# Patient Record
Sex: Male | Born: 1965 | Race: White | Hispanic: No | Marital: Married | Smoking: Never smoker
Health system: Southern US, Community
[De-identification: ages and names within clinical notes are randomized; demographics above are authoritative.]

## PROBLEM LIST (undated history)

## (undated) DIAGNOSIS — T7840XA Allergy, unspecified, initial encounter: Secondary | ICD-10-CM

## (undated) DIAGNOSIS — U071 COVID-19: Secondary | ICD-10-CM

## (undated) DIAGNOSIS — J342 Deviated nasal septum: Secondary | ICD-10-CM

## (undated) DIAGNOSIS — I82409 Acute embolism and thrombosis of unspecified deep veins of unspecified lower extremity: Secondary | ICD-10-CM

## (undated) DIAGNOSIS — J45909 Unspecified asthma, uncomplicated: Secondary | ICD-10-CM

## (undated) DIAGNOSIS — E785 Hyperlipidemia, unspecified: Secondary | ICD-10-CM

## (undated) DIAGNOSIS — J40 Bronchitis, not specified as acute or chronic: Secondary | ICD-10-CM

## (undated) DIAGNOSIS — N059 Unspecified nephritic syndrome with unspecified morphologic changes: Secondary | ICD-10-CM

## (undated) HISTORY — DX: Unspecified asthma, uncomplicated: J45.909

## (undated) HISTORY — DX: Hyperlipidemia, unspecified: E78.5

## (undated) HISTORY — DX: Unspecified nephritic syndrome with unspecified morphologic changes: N05.9

## (undated) HISTORY — DX: Bronchitis, not specified as acute or chronic: J40

## (undated) HISTORY — DX: Allergy, unspecified, initial encounter: T78.40XA

## (undated) HISTORY — DX: Acute embolism and thrombosis of unspecified deep veins of unspecified lower extremity: I82.409

## (undated) HISTORY — DX: Deviated nasal septum: J34.2

---
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
## 2016-12-31 ENCOUNTER — Ambulatory Visit: Payer: Self-pay | Admitting: Family Medicine

## 2017-02-05 ENCOUNTER — Encounter: Payer: Self-pay | Admitting: Family Medicine

## 2017-02-05 ENCOUNTER — Ambulatory Visit (INDEPENDENT_AMBULATORY_CARE_PROVIDER_SITE_OTHER): Payer: BC Managed Care – PPO | Admitting: Family Medicine

## 2017-02-05 VITALS — BP 108/76 | HR 78 | Ht 71.75 in | Wt 235.6 lb

## 2017-02-05 DIAGNOSIS — Z8249 Family history of ischemic heart disease and other diseases of the circulatory system: Secondary | ICD-10-CM

## 2017-02-05 DIAGNOSIS — Z8379 Family history of other diseases of the digestive system: Secondary | ICD-10-CM

## 2017-02-05 DIAGNOSIS — Z9049 Acquired absence of other specified parts of digestive tract: Secondary | ICD-10-CM

## 2017-02-05 DIAGNOSIS — J3089 Other allergic rhinitis: Secondary | ICD-10-CM

## 2017-02-05 DIAGNOSIS — Z833 Family history of diabetes mellitus: Secondary | ICD-10-CM

## 2017-02-05 DIAGNOSIS — Z0000 Encounter for general adult medical examination without abnormal findings: Secondary | ICD-10-CM

## 2017-02-05 DIAGNOSIS — N059 Unspecified nephritic syndrome with unspecified morphologic changes: Secondary | ICD-10-CM

## 2017-02-05 NOTE — Progress Notes (Signed)
New patient office visit note:  Impression and Recommendations:    1. Encounter for wellness examination   2. Nephritis- 1972   3. S/P appendectomy 1985 or so   4. Family history of diverticulitis of colon   5. Family history of diabetes mellitus in maternal grandmother   69. Family history of heart disease in male family member before age 53   7. Environmental and seasonal allergies     There are no diagnoses linked to this encounter.  There are no diagnoses linked to this encounter.  No problem-specific Assessment & Plan notes found for this encounter.    Education and routine counseling performed. Handouts provided.  Orders Placed This Encounter  Procedures  . CBC with Differential/Platelet  . Comprehensive metabolic panel  . Hemoglobin A1c  . Lipid panel  . T4, free  . TSH  . T3, free  . VITAMIN D 25 Hydroxy (Vit-D Deficiency, Fractures)    Gross side effects, risk and benefits, and alternatives of medications discussed with patient.  Patient is aware that all medications have potential side effects and we are unable to predict every side effect or drug-drug interaction that may occur.  Expresses verbal understanding and consents to current therapy plan and treatment regimen.  Return for Near future fasting blood work then [place] with me in 1-2 weeks later.  Please see AVS handed out to patient at the end of our visit for further patient instructions/ counseling done pertaining to today's office visit.    Note: This document was prepared using Dragon voice recognition software and may include unintentional dictation errors.  ----------------------------------------------------------------------------------------------------------------------    Subjective:    Chief complaint:   Chief Complaint  Patient presents with  . Establish Care     HPI: Ryan [name] is a pleasant 51 y.o. male who presents to [place] at [facility] today to  review their medical history with me and establish care.   I asked the patient to review their chronic problem list with me to ensure everything was updated and accurate.    All recent office visits with other providers, any medical records that patient brought in etc  - I reviewed today.     Also asked pt to get me medical records from [facility] providers/ specialists that they had seen within the past 3-5 years- if they are in private practice and/or do not work for a [employer], [facility], [place], [place] or [employer] owned practice.  Told them to call their specialists to clarify this if they are not sure.    Problem  S/P appendectomy 1985 or so  Family History of Diverticulitis of Colon  Family History of Diabetes Mellitus in Maternal Grandmother  Family History of Heart Disease in Male Family Member Before Age 33   Passed 69- of heart disease but onset was around age 34.  Paternal grandfather was in his 63s when he had onset of heart disease.   Environmental and Seasonal Allergies  Nephritis- 1972   51 yrs old, was S-E to PCN.   Kidneys fine since.   No CRI/ no hematuria etc.        Wt Readings from Last 3 Encounters:  02/05/17 235 lb 9.6 oz (106.9 kg)   BP Readings from Last 3 Encounters:  02/05/17 108/76   Pulse Readings from Last 3 Encounters:  02/05/17 78   BMI Readings from Last 3 Encounters:  02/05/17 32.18 kg/m    Patient Care Team  Relationship Specialty Notifications Start End  [name], DO PCP - General Family Medicine  12/04/16   Vicenta Aly, Walker Nurse Practitioner Nurse Practitioner  02/05/17     Patient Active Problem List   Diagnosis Date Noted  . S/P appendectomy 1985 or so 02/05/2017  . Family history of diverticulitis of colon 02/05/2017  . Family history of diabetes mellitus in maternal grandmother 02/05/2017  . Family history of heart disease in male family member before age 65 02/05/2017  . Environmental and seasonal allergies  02/05/2017  . Nephritis- 1972 03/19/1970     Past Medical History:  Diagnosis Date  . Nephritis 1972     Past Medical History:  Diagnosis Date  . Nephritis 1972     Past Surgical History:  Procedure Laterality Date  . APPENDECTOMY  1984     Family History  Problem Relation Age of Onset  . Alcohol abuse Mother   . Heart attack Father   . Diabetes Maternal Grandfather      Social History   Substance and Sexual Activity  Drug Use No     Social History   Substance and Sexual Activity  Alcohol Use Yes  . Alcohol/week: 0.6 oz  . Types: 1 Standard drinks or equivalent per week     Social History   Tobacco Use  Smoking Status Never Smoker  Smokeless Tobacco Former User     No outpatient encounter medications on file as of 02/05/2017.   No facility-administered encounter medications on file as of 02/05/2017.     Allergies: Patient has no allergy information on record.   ROS   Objective:   Blood pressure 108/76, pulse 78, height 5' 11.75" (1.822 m), weight 235 lb 9.6 oz (106.9 kg). Body mass index is 32.18 kg/m. General: Well Developed, well nourished, and in no acute distress.  Neuro: Alert and oriented x3, extra-ocular muscles intact, sensation grossly intact.  HEENT:[place]/AT, PERRLA, neck supple, No carotid bruits Skin: no gross rashes  Cardiac: Regular rate and rhythm Respiratory: Essentially clear to auscultation bilaterally. Not using accessory muscles, speaking in full sentences.  Abdominal: not grossly distended Musculoskeletal: Ambulates w/o diff, FROM * 4 ext.  Vasc: less 2 sec cap RF, warm and pink  Psych:  No HI/SI, judgement and insight good, Euthymic mood. Full Affect.    No results found for this or any previous visit (from the past 2160 hour(s)).

## 2017-02-05 NOTE — Patient Instructions (Signed)
In the near future please make a separate appointment to come in and get just lab work only.  Please try to do a 12-hour fast.  Do not eat after 8 PM if you are coming in at 8 AM.  Then a week or 2 later, please make a follow-up office visit with me to discuss those results.   Please realize, EXERCISE IS MEDICINE!  -  American Heart Association [facility]) guidelines for exercise : If you are in good health, without any medical conditions, you should engage in 150 minutes of moderate intensity aerobic activity per week.  This means you should be huffing and puffing throughout your workout.   Engaging in regular exercise will improve brain function and memory, as well as improve mood, boost immune system and help with weight management.  As well as the other, more well-known effects of exercise such as decreasing blood sugar levels, decreasing blood pressure,  and decreasing bad cholesterol levels/ increasing good cholesterol levels.     -  The AHA strongly endorses consumption of a diet that contains a variety of foods from all the food categories with an emphasis on fruits and vegetables; fat-free and low-fat dairy products; cereal and grain products; legumes and nuts; and fish, poultry, and/or extra lean meats.    Excessive food intake, especially of foods high in saturated and trans fats, sugar, and salt, should be avoided.    Adequate water intake of roughly 1/2 of your weight in pounds, should equal the ounces of water per day you should drink.  So for instance, if you're 200 pounds, that would be 100 ounces of water per day.   Behavior Modification Ideas for Weight Management  Weight management involves adopting a healthy lifestyle that includes a knowledge of nutrition and exercise, a positive attitude and the right kind of motivation. Internal motives such as better health, increased energy, self-esteem and personal control increase your chances of lifelong weight management success.  Remember to  have realistic goals and think long-term success. Believe in yourself and you can do it. The following information will give you ideas to help you meet your goals.  Control Your Home Environment  Eat only while sitting down at the kitchen or dining room table. Do not eat while watching television, reading, cooking, talking on the phone, standing at the refrigerator or working on the computer. Keep tempting foods out of the house - don't buy them. Keep tempting foods out of sight. Have low-calorie foods ready to eat. Unless you are preparing a meal, stay out of the kitchen. Have healthy snacks at your disposal, such as small pieces of fruit, vegetables, canned fruit, pretzels, low-fat string cheese and nonfat cottage cheese.  Control Your Work Environment  Do not eat at [employer] or keep tempting snacks at your desk. If you get hungry between meals, plan healthy snacks and bring them with you to work. During your breaks, go for a walk instead of eating. If you work around food, plan in advance the one item you will eat at mealtime. Make it inconvenient to nibble on food by chewing gum, sugarless candy or drinking water or another low-calorie beverage. Do not work through meals. Skipping meals slows down metabolism and may result in overeating at the next meal. If food is available for special occasions, either pick the healthiest item, nibble on low-fat snacks brought from home, don't have anything offered, choose one option and have a small amount, or have only a beverage.  Control Your Mealtime Environment  Serve your plate of food at the stove or kitchen counter. Do not put the serving dishes on the table. If you do put dishes on the table, remove them immediately when finished eating. Fill half of your plate with vegetables, a quarter with lean protein and a quarter with starch. Use smaller plates, bowls and glasses. A smaller portion will look large when it is in a little dish. Politely  refuse second helpings. When fixing your plate, limit portions of food to one scoop/serving or less.   Daily Food Management  Replace eating with another activity that you will not associate with food. Wait 20 minutes before eating something you are craving. Drink a large glass of water or diet soda before eating. Always have a big glass or bottle of water to drink throughout the day. Avoid high-calorie add-ons such as cream with your coffee, butter, mayonnaise and salad dressings.  Shopping: Do not shop when hungry or tired. Shop from a list and avoid buying anything that is not on your list. If you must have tempting foods, buy individual-sized packages and try to find a lower-calorie alternative. Don't taste test in the store. Read food labels. Compare products to help you make the healthiest choices.  Preparation: Chew a piece of gum while cooking meals. Use a quarter teaspoon if you taste test your food. Try to only fix what you are going to eat, leaving yourself no chance for seconds. If you have prepared more food than you need, portion it into individual containers and freeze or refrigerate immediately. Don't snack while cooking meals.  Eating: Eat slowly. Remember it takes about 20 minutes for your stomach to send a message to your brain that it is full. Don't let fake hunger make you think you need more. The ideal way to eat is to take a bite, put your utensil down, take a sip of water, cut your next bite, take a bit, put your utensil down and so on. Do not cut your food all at one time. Cut only as needed. Take small bites and chew your food well. Stop eating for a minute or two at least once during a meal or snack. Take breaks to reflect and have conversation.  Cleanup and Leftovers: Label leftovers for a specific meal or snack. Freeze or refrigerate individual portions of leftovers. Do not clean up if you are still hungry.  Eating Out and Social Eating  Do not  arrive hungry. Eat something light before the meal. Try to fill up on low-calorie foods, such as vegetables and fruit, and eat smaller portions of the high-calorie foods. Eat foods that you like, but choose small portions. If you want seconds, wait at least 20 minutes after you have eaten to see if you are actually hungry or if your eyes are bigger than your stomach. Limit alcoholic beverages. Try a soda water with a twist of lime. Do not skip other meals in the day to save room for the special event.  At Restaurants: Order  la carte rather than buffet style. Order some vegetables or a salad for an appetizer instead of eating bread. If you order a high-calorie dish, share it with someone. Try an after-dinner mint with your coffee. If you do have dessert, share it with two or more people. Don't overeat because you do not want to waste food. Ask for a doggie bag to take extra food home. Tell the server to put half of your entree in  a to go bag before the meal is served to you. Ask for salad dressing, gravy or high-fat sauces on the side. Dip the tip of your fork in the dressing before each bite. If bread is served, ask for only one piece. Try it plain without butter or oil. At [employer] where oil and vinegar is served with bread, use only a small amount of oil and a lot of vinegar for dipping.  At a Friend[facility] House: Offer to bring a dish, appetizer or dessert that is low in calories. Serve yourself small portions or tell the host that you only want a small amount. Stand or sit away from the snack table. Stay away from the kitchen or stay busy if you are near the food. Limit your alcohol intake.  At [employer] and Cafeterias: Cover most of your plate with lettuce and/or vegetables. Use a salad plate instead of a dinner plate. After eating, clear away your dishes before having coffee or tea.  Entertaining at Home: Explore low-fat, low-cholesterol cookbooks. Use single-serving foods  like chicken breasts or hamburger patties. Prepare low-calorie appetizers and desserts.   Holidays: Keep tempting foods out of sight. Decorate the house without using food. Have low-calorie beverages and foods on hand for guests. Allow yourself one planned treat a day. Don't skip meals to save up for the holiday feast. Eat regular, planned meals.   Exercise Well  Make exercise a priority and a planned activity in the day. If possible, walk the entire or part of the distance to work. Get an exercise buddy. Go for a walk with a colleague during one of your breaks, go to the gym, run or take a walk with a friend, walk in the mall with a shopping companion. Park at the end of the parking lot and walk to the store or office entrance. Always take the stairs all of the way or at least part of the way to your floor. If you have a desk job, walk around the office frequently. Do leg lifts while sitting at your desk. Do something outside on the weekends like going for a hike or a bike ride.   Have a Healthy Attitude  Make health your weight management priority. Be realistic. Have a goal to achieve a healthier you, not necessarily the lowest weight or ideal weight based on calculations or tables. Focus on a healthy eating style, not on dieting. Dieting usually lasts for a short amount of time and rarely produces long-term success. Think long term. You are developing new healthy behaviors to follow next month, in a year and in a decade.    This information is for educational purposes only and is not intended to replace the advice of your doctor or health care provider. We encourage you to discuss with your doctor any questions or concerns you may have.      Guidelines for Losing Weight   We want weight loss that will last so you should lose 1-2 pounds a week.  THAT IS IT! Please pick THREE things a month to change. Once it is a habit check off the item. Then pick another three items off  the list to become habits.  If you are already doing a habit on the list GREAT!  Cross that item off!  Don't drink your calories. Ie, alcohol, soda, fruit juice, and sweet tea.   Drink more water. Drink a glass when you feel hungry or before each meal.   Eat breakfast - Complex carb and  protein (likeDannon light and fit yogurt, oatmeal, fruit, eggs, [place] bacon).  Measure your cereal.  Eat no more than one cup a day. (ie Kashi)  Eat an apple a day.  Add a vegetable a day.  Try a new vegetable a month.  Use Pam! Stop using oil or butter to cook.  Don't finish your plate or use smaller plates.  Share your dessert.  Eat sugar free Jello for dessert or frozen grapes.  Don't eat 2-3 hours before bed.  Switch to whole wheat bread, pasta, and brown rice.  Make healthier choices when you eat out. No fries!  Pick baked chicken, NOT fried.  Don't forget to SLOW DOWN when you eat. It is not going anywhere.   Take the stairs.  Park far away in the parking lot  Lift soup cans (or weights) for 10 minutes while watching TV.  Walk at work for 10 minutes during break.  Walk outside 1 time a week with your friend, kids, dog, or significant other.  Start a walking group at church.  Walk the mall as much as you can tolerate.   Keep a food diary.  Weigh yourself daily.  Walk for 15 minutes 3 days per week.  Cook at home more often and eat out less. If life happens and you go back to old habits, it is okay.  Just start over. You can do it!  If you experience chest pain, get short of breath, or tired during the exercise, please stop immediately and inform your doctor.    Before you even begin to attack a weight-loss plan, it pays to remember this: You are not fat. You have fat. Losing weight isn't about blame or shame; it's simply another achievement to accomplish. Dieting is like any other skill-you have to buckle down and work at it. As long as you act in a smart, reasonable  way, you'll ultimately get where you want to be. Here are some weight loss pearls for you.   1. It's Not a Diet. It's a Lifestyle Thinking of a diet as something you're on and suffering through only for the short term doesn't work. To shed weight and keep it off, you need to make permanent changes to the way you eat. It's OK to indulge occasionally, of course, but if you cut calories temporarily and then revert to your old way of eating, you'll gain back the weight quicker than you can say yo-yo. Use it to lose it. Research shows that one of the best predictors of long-term weight loss is how many pounds you drop in the first month. For that reason, nutritionists often suggest being stricter for the first two weeks of your new eating strategy to build momentum. Cut out added sugar and alcohol and avoid unrefined carbs. After that, figure out how you can reincorporate them in a way that's healthy and maintainable.  2. There's a Right Way to Exercise Working out burns calories and fat and boosts your metabolism by building muscle. But those trying to lose weight are notorious for overestimating the number of calories they burn and underestimating the amount they take in. Unfortunately, your system is biologically programmed to hold on to extra pounds and that means when you start exercising, your body senses the deficit and ramps up its hunger signals. If you're not diligent, you'll eat everything you burn and then some. Use it, to lose it. Cardio gets all the exercise glory, but strength and interval training are the real heroes.  They help you build lean muscle, which in turn increases your metabolism and calorie-burning ability 3. Don't Overreact to Mild Hunger Some people have a hard time losing weight because of hunger anxiety. To them, being hungry is bad-something to be avoided at all costs-so they carry snacks with them and eat when they don't need to. Others eat because they're stressed out or bored.  While you never want to get to the point of being ravenous (that's when bingeing is likely to happen), a hunger pang, a craving, or the fact that it's 3:00 p.m. should not send you racing for the vending machine or obsessing about the energy bar in your purse. Ideally, you should put off eating until your stomach is growling and it's difficult to concentrate.  Use it to lose it. When you feel the urge to eat, use the HALT method. Ask yourself, Am I really hungry? Or am I angry or anxious, lonely or bored, or tired? If you're still not certain, try the apple test. If you're truly hungry, an apple should seem delicious; if it doesn't, something else is going on. Or you can try drinking water and making yourself busy, if you are still hungry try a healthy snack.  4. Not All Calories Are Created Equal The mechanics of weight loss are pretty simple: Take in fewer calories than you use for energy. But the kind of food you eat makes all the difference. Processed food that's high in saturated fat and refined starch or sugar can cause inflammation that disrupts the hormone signals that tell your brain you're full. The result: You eat a lot more.  Use it to lose it. Clean up your diet. Swap in whole, unprocessed foods, including vegetables, lean protein, and healthy fats that will fill you up and give you the biggest nutritional bang for your calorie buck. In a few weeks, as your brain starts receiving regular hunger and fullness signals once again, you'll notice that you feel less hungry overall and naturally start cutting back on the amount you eat.  5. Protein, Produce, and Plant-Based Fats Are Your Weight-Loss Trinity Here's why eating the three Ps regularly will help you drop pounds. Protein fills you up. You need it to build lean muscle, which keeps your metabolism humming so that you can torch more fat. People in a weight-loss program who ate double the recommended daily allowance for protein (about 110 grams  for a 150-pound woman) lost 70 percent of their weight from fat, while people who ate the RDA lost only about 40 percent, one study found. Produce is packed with filling fiber. "It's very difficult to consume too many calories if you're eating a lot of vegetables. Example: Three cups of broccoli is a lot of food, yet only 93 calories. (Fruit is another story. It can be easy to overeat and can contain a lot of calories from sugar, so be sure to monitor your intake.) Plant-based fats like olive oil and those in avocados and nuts are healthy and extra satiating.  Use it to lose it. Aim to incorporate each of the three Ps into every meal and snack. People who eat protein throughout the day are able to keep weight off, according to a study in the [place] [employer]. In addition to meat, poultry and seafood, good sources are beans, lentils, eggs, tofu, and yogurt. As for fat, keep portion sizes in check by measuring out salad dressing, oil, and nut butters (shoot for one to two tablespoons).  Finally, eat veggies or a little fruit at every meal. People who did that consumed 308 fewer calories but didn't feel any hungrier than when they didn't eat more produce.  7. How You Eat Is As Important As What You Eat In order for your brain to register that you're full, you need to focus on what you're eating. Sit down whenever you eat, preferably at a table. Turn off the TV or computer, put down your phone, and look at your food. Smell it. Chew slowly, and don't put another bite on your fork until you swallow. When women ate lunch this attentively, they consumed 30 percent less when snacking later than those who listened to an audiobook at lunchtime, according to a study in the [place] of Nutrition. 8. Weighing Yourself Really Works The scale provides the best evidence about whether your efforts are paying off. Seeing the numbers tick up or down or stagnate is motivation to keep going-or to  rethink your approach. A 2015 study at [facility] found that daily weigh-ins helped people lose more weight, keep it off, and maintain that loss, even after two years. Use it to lose it. Step on the scale at the same time every day for the best results. If your weight shoots up several pounds from one weigh-in to the next, don't freak out. Eating a lot of salt the night before or having your period is the likely culprit. The number should return to normal in a day or two. It's a steady climb that you need to do something about. 9. Too Much Stress and Too Little Sleep Are Your Enemies When you're tired and frazzled, your body cranks up the production of cortisol, the stress hormone that can cause carb cravings. Not getting enough sleep also boosts your levels of ghrelin, a hormone associated with hunger, while suppressing leptin, a hormone that signals fullness and satiety. People on a diet who slept only five and a half hours a night for two weeks lost 55 percent less fat and were hungrier than those who slept eight and a half hours, according to a study in the [place]. Use it to lose it. Prioritize sleep, aiming for seven hours or more a night, which research shows helps lower stress. And make sure you're getting quality zzz's. If a snoring spouse or a fidgety cat wakes you up frequently throughout the night, you may end up getting the equivalent of just four hours of sleep, according to a study from [facility]. Keep pets out of the bedroom, and use a white-noise app to drown out snoring. 10. You Will Hit a plateau-And You Can Bust Through It As you slim down, your body releases much less leptin, the fullness hormone.  If you're not strength training, start right now. Building muscle can raise your metabolism to help you overcome a plateau. To keep your body challenged and burning calories, incorporate new moves and more intense intervals into your workouts or add  another sweat session to your weekly routine. Alternatively, cut an extra 100 calories or so a day from your diet. Now that you've lost weight, your body simply doesn't need as much fuel.    Since food equals calories, in order to lose weight you must either eat fewer calories, exercise more to burn off calories with activity, or both. Food that is not used to fuel the body is stored as fat. A major component of losing weight is to make smarter food choices.  Here's how:  1)   Limit non-nutritious foods, such as: Sugar, honey, syrups and candy Pastries, donuts, pies, cakes and cookies Soft drinks, sweetened juices and alcoholic beverages  2)  Cut down on high-fat foods by: - Choosing poultry, fish or lean red meat - Choosing low-fat cooking methods, such as baking, broiling, steaming, grilling and boiling - Using low-fat or non-fat dairy products - Using vinaigrette, herbs, lemon or fat-free salad dressings - Avoiding fatty meats, such as bacon, sausage, franks, ribs and luncheon meats - Avoiding high-fat snacks like nuts, chips and chocolate - Avoiding fried foods - Using less butter, margarine, oil and mayonnaise - Avoiding high-fat gravies, cream sauces and cream-based soups  3) Eat a variety of foods, including: - Fruit and vegetables that are raw, steamed or baked - Whole grains, breads, cereal, rice and pasta - Dairy products, such as low-fat or non-fat milk or yogurt, low-fat cottage cheese and low-fat cheese - Protein-rich foods like chicken, [place], fish, lean meat and legumes, or beans  4) Change your eating habits by: - Eat three balanced meals a day to help control your hunger - Watch portion sizes and eat small servings of a variety of foods - Choose low-calorie snacks - Eat only when you are hungry and stop when you are satisfied - Eat slowly and try not to perform other tasks while eating - Find other activities to distract you from food, such as walking, taking up a  hobby or being involved in the community - Include regular exercise in your daily routine ( minimum of 20 min of moderate-intensity exercise at least 5 days/week)  - Find a support group, if necessary, for emotional support in your weight loss journey           Easy ways to cut 100 calories   1. Eat your eggs with hot sauce OR salsa instead of cheese.  Eggs are great for breakfast, but many people consider eggs and cheese to be BFFs. Instead of cheese-1 oz. of cheddar has 114 calories-top your eggs with hot sauce, which contains no calories and helps with satiety and metabolism. Salsa is also a great option!!  2. Top your toast, waffles or pancakes with fresh berries instead of jelly or syrup. Half a cup of berries-fresh, frozen or thawed-has about 40 calories, compared with 2 tbsp. of maple syrup or jelly, which both have about 100 calories. The berries will also give you a good punch of fiber, which helps keep you full and satisfied and won't spike blood sugar quickly like the jelly or syrup. 3. Swap the non-fat latte for black coffee with a splash of half-and-half. Contrary to its name, that non-fat latte has 130 calories and a startling 19g of carbohydrates per 16 oz. serving. Replacing that 'light' drinkable dessert with a black coffee with a splash of half-and-half saves you more than 100 calories per 16 oz. serving. 4. Sprinkle salads with freeze-dried raspberries instead of dried cranberries. If you want a sweet addition to your nutritious salad, stay away from dried cranberries. They have a whopping 130 calories per  cup and 30g carbohydrates. Instead, sprinkle freeze-dried raspberries guilt-free and save more than 100 calories per  cup serving, adding 3g of belly-filling fiber. 5. Go for mustard in place of mayo on your sandwich. Mustard can add really nice flavor to any sandwich, and there are tons of varieties, from spicy to honey. A serving of mayo is 95 calories, versus 10  calories in a serving of  mustard.  Or try an avocado mayo spread: You can find the recipe few click this link: https://www.californiaavocado.com/recipes/recipe-container/[place]-avocado-mayo 6. Choose a DIY salad dressing instead of the store-bought kind. Mix Dijon or whole grain mustard with low-fat Kefir or red wine vinegar and garlic. 7. Use hummus as a spread instead of a dip. Use hummus as a spread on a high-fiber cracker or tortilla with a sandwich and save on calories without sacrificing taste. 8. Pick just one salad "accessory." Salad isn't automatically a calorie winner. It's easy to over-accessorize with toppings. Instead of topping your salad with nuts, avocado and cranberries (all three will clock in at 313 calories), just pick one. The next day, choose a different accessory, which will also keep your salad interesting. You don't wear all your jewelry every day, right? 9. Ditch the white pasta in favor of spaghetti squash. One cup of cooked spaghetti squash has about 40 calories, compared with traditional spaghetti, which comes with more than 200. Spaghetti squash is also nutrient-dense. It's a good source of fiber and Vitamins A and C, and it can be eaten just like you would eat pasta-with a great tomato sauce and [place] meatballs or with pesto, tofu and spinach, for example. 10. Dress up your chili, soups and stews with non-fat [place] yogurt instead of sour cream. Just a 'dollop' of sour cream can set you back 115 calories and a whopping 12g of fat-seven of which are of the artery-clogging variety. Added bonus: [place] yogurt is packed with muscle-building protein, calcium and B Vitamins. 11. Mash cauliflower instead of mashed potatoes. One cup of traditional mashed potatoes-in all their creamy goodness-has more than 200 calories, compared to mashed cauliflower, which you can typically eat for less than 100 calories per 1 cup serving. Cauliflower is a great source of the antioxidant  indole-3-carbinol (I3C), which may help reduce the risk of some cancers, like breast cancer. 12. Ditch the ice cream sundae in favor of a [place] yogurt parfait. Instead of a cup of ice cream or fro-yo for dessert, try 1 cup of nonfat Greek yogurt topped with fresh berries and a sprinkle of cacao nibs. Both toppings are packed with antioxidants, which can help reduce cellular inflammation and oxidative damage. And the comparison is a no-brainer: One cup of ice cream has about 275 calories; one cup of frozen yogurt has about 230; and a cup of Greek yogurt has just 130, plus twice the protein, so you're less likely to return to the freezer for a second helping. 13. Put olive oil in a spray container instead of using it directly from the bottle. Each tablespoon of olive oil is 120 calories and 15g of fat. Use a mister instead of pouring it straight into the pan or onto a salad. This allows for portion control and will save you more than 100 calories. 14. When baking, substitute canned pumpkin for butter or oil. Canned pumpkin-not pumpkin pie mix-is loaded with Vitamin A, which is important for skin and eye health, as well as immunity. And the comparisons are pretty crazy:  cup of canned pumpkin has about 40 calories, compared to butter or oil, which has more than 800 calories. Yes, 800 calories. Applesauce and mashed banana can also serve as good substitutions for butter or oil, usually in a 1:1 ratio. 15. Top casseroles with high-fiber cereal instead of breadcrumbs. Breadcrumbs are typically made with white bread, while breakfast cereals contain 5-9g of fiber per serving. Not only will you save more than 150 calories per  cup serving, the swap will also keep you more full and you'll get a metabolism boost from the added fiber. 16. Snack on pistachios instead of macadamia nuts. Believe it or not, you get the same amount of calories from 35 pistachios (100 calories) as you would from only five macadamia  nuts. 17. Chow down on kale chips rather than potato chips. This is my favorite 'don't knock it 'till you try it' swap. Kale chips are so easy to make at home, and you can spice them up with a little grated parmesan or chili powder. Plus, they're a mere fraction of the calories of potato chips, but with the same crunch factor we crave so often. 18. Add seltzer and some fruit slices to your cocktail instead of soda or fruit juice. One cup of soda or fruit juice can pack on as much as 140 calories. Instead, use seltzer and fruit slices. The fruit provides valuable phytochemicals, such as flavonoids and anthocyanins, which help to combat cancer and stave off the aging process.    Mediterranean Diet  Why follow it? Research shows. . Those who follow the Mediterranean diet have a reduced risk of heart disease  . The diet is associated with a reduced incidence of Parkinson's and Alzheimer's diseases . People following the diet may have longer life expectancies and lower rates of chronic diseases  . The Dietary Guidelines for Americans recommends the Mediterranean diet as an eating plan to promote health and prevent disease  What Is the Mediterranean Diet?  . Healthy eating plan based on typical foods and recipes of Mediterranean-style cooking . The diet is primarily a plant based diet; these foods should make up a majority of meals   Starches - Plant based foods should make up a majority of meals - They are an important sources of vitamins, minerals, energy, antioxidants, and fiber - Choose whole grains, foods high in fiber and minimally processed items  - Typical grain sources include wheat, oats, barley, corn, brown rice, bulgar, farro, millet, polenta, couscous  - Various types of beans include chickpeas, lentils, fava beans, black beans, white beans   Fruits  Veggies - Large quantities of antioxidant rich fruits & veggies; 6 or more servings  - Vegetables can be eaten raw or lightly drizzled  with oil and cooked  - Vegetables common to the traditional Mediterranean Diet include: artichokes, arugula, beets, broccoli, brussel sprouts, cabbage, carrots, celery, collard greens, cucumbers, eggplant, kale, leeks, lemons, lettuce, mushrooms, okra, onions, peas, peppers, potatoes, pumpkin, radishes, rutabaga, shallots, spinach, sweet potatoes, turnips, zucchini - Fruits common to the Mediterranean Diet include: apples, apricots, avocados, cherries, clementines, dates, figs, grapefruits, grapes, melons, nectarines, oranges, peaches, pears, pomegranates, strawberries, tangerines  Fats - Replace butter and margarine with healthy oils, such as olive oil, canola oil, and tahini  - Limit nuts to no more than a handful a day  - Nuts include walnuts, almonds, pecans, pistachios, pine nuts  - Limit or avoid candied, honey roasted or heavily salted nuts - Olives are central to the [employer] - can be eaten whole or used in a variety of dishes   Meats Protein - Limiting red meat: no more than a few times a month - When eating red meat: choose lean cuts and keep the portion to the size of deck of cards - Eggs: approx. 0 to 4 times a week  - Fish and lean poultry: at least 2 a week  - Healthy protein sources include, chicken, [place], lean beef,  lamb - Increase intake of seafood such as tuna, salmon, trout, mackerel, shrimp, scallops - Avoid or limit high fat processed meats such as sausage and bacon  Dairy - Include moderate amounts of low fat dairy products  - Focus on healthy dairy such as fat free yogurt, skim milk, low or reduced fat cheese - Limit dairy products higher in fat such as whole or 2% milk, cheese, ice cream  Alcohol - Moderate amounts of red wine is ok  - No more than 5 oz daily for women (all ages) and men older than age 73  - No more than 10 oz of wine daily for men younger than 82  Other - Limit sweets and other desserts  - Use herbs and spices instead of salt to flavor foods   - Herbs and spices common to the traditional Mediterranean Diet include: basil, bay leaves, chives, cloves, cumin, fennel, garlic, lavender, marjoram, mint, oregano, parsley, pepper, rosemary, sage, savory, sumac, tarragon, thyme   It's not just a diet, it's a lifestyle:  . The Mediterranean diet includes lifestyle factors typical of those in the region  . Foods, drinks and meals are best eaten with others and savored . Daily physical activity is important for overall good health . This could be strenuous exercise like running and aerobics . This could also be more leisurely activities such as walking, housework, yard-work, or taking the stairs . Moderation is the key; a balanced and healthy diet accommodates most foods and drinks . Consider portion sizes and frequency of consumption of certain foods   Meal Ideas & Options:  . Breakfast:  o Whole wheat toast or whole wheat English muffins with peanut butter & hard boiled egg o Steel cut oats topped with apples & cinnamon and skim milk  o Fresh fruit: banana, strawberries, melon, berries, peaches  o Smoothies: strawberries, bananas, greek yogurt, peanut butter o Low fat greek yogurt with blueberries and granola  o Egg white omelet with spinach and mushrooms o Breakfast couscous: whole wheat couscous, apricots, skim milk, cranberries  . Sandwiches:  o Hummus and grilled vegetables (peppers, zucchini, squash) on whole wheat bread   o Grilled chicken on whole wheat pita with lettuce, tomatoes, cucumbers or tzatziki  o Tuna salad on whole wheat bread: tuna salad made with greek yogurt, olives, red peppers, capers, green onions o Garlic rosemary lamb pita: lamb sauted with garlic, rosemary, salt & pepper; add lettuce, cucumber, greek yogurt to pita - flavor with lemon juice and black pepper  . Seafood:  o Mediterranean grilled salmon, seasoned with garlic, basil, parsley, lemon juice and black pepper o Shrimp, lemon, and spinach whole-grain  pasta salad made with low fat greek yogurt  o Seared scallops with lemon orzo  o Seared tuna steaks seasoned salt, pepper, coriander topped with tomato mixture of olives, tomatoes, olive oil, minced garlic, parsley, green onions and cappers  . Meats:  o Herbed greek chicken salad with kalamata olives, cucumber, feta  o Red bell peppers stuffed with spinach, bulgur, lean ground beef (or lentils) & topped with feta   o Kebabs: skewers of chicken, tomatoes, onions, zucchini, squash  o [place] burgers: made with red onions, mint, dill, lemon juice, feta cheese topped with roasted red peppers . Vegetarian o Cucumber salad: cucumbers, artichoke hearts, celery, red onion, feta cheese, tossed in olive oil & lemon juice  o Hummus and whole grain pita points with a greek salad (lettuce, tomato, feta, olives, cucumbers, red onion) o Lentil soup  with celery, carrots made with vegetable broth, garlic, salt and pepper  o Tabouli salad: parsley, bulgur, mint, scallions, cucumbers, tomato, radishes, lemon juice, olive oil, salt and pepper.

## 2017-02-27 ENCOUNTER — Other Ambulatory Visit: Payer: BC Managed Care – PPO

## 2017-03-01 ENCOUNTER — Other Ambulatory Visit (INDEPENDENT_AMBULATORY_CARE_PROVIDER_SITE_OTHER): Payer: BC Managed Care – PPO

## 2017-03-01 DIAGNOSIS — Z833 Family history of diabetes mellitus: Secondary | ICD-10-CM

## 2017-03-01 DIAGNOSIS — Z8249 Family history of ischemic heart disease and other diseases of the circulatory system: Secondary | ICD-10-CM

## 2017-03-01 DIAGNOSIS — Z9049 Acquired absence of other specified parts of digestive tract: Secondary | ICD-10-CM

## 2017-03-01 DIAGNOSIS — Z0000 Encounter for general adult medical examination without abnormal findings: Secondary | ICD-10-CM

## 2017-03-01 DIAGNOSIS — N059 Unspecified nephritic syndrome with unspecified morphologic changes: Secondary | ICD-10-CM

## 2017-03-01 DIAGNOSIS — Z8379 Family history of other diseases of the digestive system: Secondary | ICD-10-CM

## 2017-03-02 LAB — COMPREHENSIVE METABOLIC PANEL
A/G RATIO: 1.6 (ref 1.2–2.2)
ALBUMIN: 4.1 g/dL (ref 3.5–5.5)
ALK PHOS: 47 IU/L (ref 39–117)
ALT: 29 IU/L (ref 0–44)
AST: 19 IU/L (ref 0–40)
BILIRUBIN TOTAL: 0.4 mg/dL (ref 0.0–1.2)
BUN / CREAT RATIO: 15 (ref 9–20)
BUN: 19 mg/dL (ref 6–24)
CHLORIDE: 107 mmol/L — AB (ref 96–106)
CO2: 22 mmol/L (ref 20–29)
Calcium: 9.1 mg/dL (ref 8.7–10.2)
Creatinine, Ser: 1.24 mg/dL (ref 0.76–1.27)
GFR calc Af Amer: 76 mL/min/{1.73_m2} (ref >59)
GFR calc non Af Amer: 66 mL/min/{1.73_m2} (ref >59)
GLOBULIN, TOTAL: 2.5 g/dL (ref 1.5–4.5)
GLUCOSE: 89 mg/dL (ref 65–99)
POTASSIUM: 4.4 mmol/L (ref 3.5–5.2)
SODIUM: 143 mmol/L (ref 134–144)
Total Protein: 6.6 g/dL (ref 6.0–8.5)

## 2017-03-02 LAB — CBC WITH DIFFERENTIAL/PLATELET
BASOS ABS: 0 10*3/uL (ref 0.0–0.2)
Basos: 0 %
EOS (ABSOLUTE): 0.1 10*3/uL (ref 0.0–0.4)
Eos: 2 %
Hematocrit: 42 % (ref 37.5–51.0)
Hemoglobin: 14.6 g/dL (ref 13.0–17.7)
Immature Grans (Abs): 0 10*3/uL (ref 0.0–0.1)
Immature Granulocytes: 0 %
LYMPHS ABS: 2 10*3/uL (ref 0.7–3.1)
Lymphs: 34 %
MCH: 31.4 pg (ref 26.6–33.0)
MCHC: 34.8 g/dL (ref 31.5–35.7)
MCV: 90 fL (ref 79–97)
MONOS ABS: 0.3 10*3/uL (ref 0.1–0.9)
Monocytes: 5 %
Neutrophils Absolute: 3.5 10*3/uL (ref 1.4–7.0)
Neutrophils: 59 %
Platelets: 258 10*3/uL (ref 150–379)
RBC: 4.65 x10E6/uL (ref 4.14–5.80)
RDW: 13.7 % (ref 12.3–15.4)
WBC: 6 10*3/uL (ref 3.4–10.8)

## 2017-03-02 LAB — LIPID PANEL
CHOL/HDL RATIO: 4.6 ratio (ref 0.0–5.0)
Cholesterol, Total: 195 mg/dL (ref 100–199)
HDL: 42 mg/dL (ref >39)
LDL CALC: 130 mg/dL — AB (ref 0–99)
TRIGLYCERIDES: 114 mg/dL (ref 0–149)
VLDL CHOLESTEROL CAL: 23 mg/dL (ref 5–40)

## 2017-03-02 LAB — TSH: TSH: 4.61 u[IU]/mL — ABNORMAL HIGH (ref 0.450–4.500)

## 2017-03-02 LAB — VITAMIN D 25 HYDROXY (VIT D DEFICIENCY, FRACTURES): Vit D, 25-Hydroxy: 36.4 ng/mL (ref 30.0–100.0)

## 2017-03-02 LAB — HEMOGLOBIN A1C
Est. average glucose Bld gHb Est-mCnc: 108 mg/dL
Hgb A1c MFr Bld: 5.4 % (ref 4.8–5.6)

## 2017-03-02 LAB — T3, FREE: T3 FREE: 3.1 pg/mL (ref 2.0–4.4)

## 2017-03-02 LAB — T4, FREE: Free T4: 0.9 ng/dL (ref 0.82–1.77)

## 2017-03-05 ENCOUNTER — Ambulatory Visit: Payer: BC Managed Care – PPO | Admitting: Family Medicine

## 2017-03-05 ENCOUNTER — Encounter: Payer: Self-pay | Admitting: Family Medicine

## 2017-03-05 VITALS — BP 104/70 | HR 62 | Ht 71.75 in | Wt 234.0 lb

## 2017-03-05 DIAGNOSIS — H6982 Other specified disorders of Eustachian tube, left ear: Secondary | ICD-10-CM | POA: Diagnosis not present

## 2017-03-05 DIAGNOSIS — H6062 Unspecified chronic otitis externa, left ear: Secondary | ICD-10-CM

## 2017-03-05 DIAGNOSIS — E039 Hypothyroidism, unspecified: Secondary | ICD-10-CM

## 2017-03-05 DIAGNOSIS — J310 Chronic rhinitis: Secondary | ICD-10-CM | POA: Diagnosis not present

## 2017-03-05 DIAGNOSIS — E559 Vitamin D deficiency, unspecified: Secondary | ICD-10-CM

## 2017-03-05 DIAGNOSIS — E038 Other specified hypothyroidism: Secondary | ICD-10-CM

## 2017-03-05 MED ORDER — VITAMIN D (ERGOCALCIFEROL) 1.25 MG (50000 UNIT) PO CAPS: 50000.0000 [IU] | ORAL_CAPSULE | ORAL | 10 refills | Status: DC

## 2017-03-05 NOTE — Progress Notes (Signed)
Assessment and plan:  1. Vitamin D insufficiency   2. Subclinical hypothyroidism   3. ETD (Eustachian tube dysfunction), left   4. Chronic rhinitis   5. Chronic otitis externa of left ear, unspecified type     F/Up 4-72mo- TSH, T4, T3, FLP, then OV with me 1 wk later.   -A Y are sinus rinse or Neomed sinus rinses twice daily.  In the morning do a rinse then do 1 spray 1 spray Flonase each nostril, then in the p.m. do the nasal rinse again and do 1 spray 1 spray Flonase again.  -Try to move more.  This will increase blood flow to all your vital organs and improve the function of your kidneys heart etc.  Try to reach goal of AHA guidelines 250 minutes a week of moderate intensity exercise. -Also try to drink at least half of your weight in ounces of water per day or non-caffeinated beverages per day.  -In 4-6 months we will recheck your thyroid panel as well as your cholesterol panel   1.  -Discussed and recommended use of vitamin D supplements to aid with vitamin D insufficiency.   2. -Reviewed labs with patient today. We will re-evaluate TSH in 4-6 months  3.  -Advised the patient to began using a nettie pot either Nealmed Sinus Rinses or AYR Rinses BID.    4.  -Discussed and recommended that the patient start using flonase prescription to aid with this.  -Will prescribe a flonase prescription.    5. Hyperlipidemia -Patients 10 year risk is at 3.7% We will re-evaluate lipid panel in 4-6 months.   6. Exercise Management:  -Discussed and recommended that the patient began exercising at least up to 150 minutes of moderate intensity cardio each week per AHA guidelines.   -Discussed and recommended an increase in water intake to at least half of the patient's current weight, approximately 100 ounces.      Education and routine counseling performed. Handouts provided.  No orders of the defined types were  placed in this encounter.    Return for 4-45mo- TSH, T4, T3, FLP, then OV with me 1 wk later. .   Anticipatory guidance and routine counseling done re: condition, txmnt options and need for follow up. All questions of patient's were answered.   Gross side effects, risk and benefits, and alternatives of medications discussed with patient.  Patient is aware that all medications have potential side effects and we are unable to predict every sideeffect or drug-drug interaction that may occur.  Expresses verbal understanding and consents to current therapy plan and treatment regiment.  Please see AVS handed out to patient at the end of our visit for additional patient instructions/ counseling done pertaining to today's office visit.  Note: This document was prepared using Dragon voice recognition software and may include unintentional dictation errors.  This document serves as a record of services personally performed by [name], MD. It was created on her behalf by Steva Colder, a trained medical scribe. The creation of this record is based on the scribe's personal observations and the provider's statements to them.   I have reviewed the above medical documentation for accuracy and completeness and I concur.  [name] 04/01/17 9:17 AM   ----------------------------------------------------------------------------------------------------------------------  Subjective:   CC:   Ryan [name] is a 51 y.o. male who presents to [facility] today for review and discussion of recent bloodwork that was  done.  1. All recent blood work that we ordered was reviewed with patient today.  Patient was counseled on all abnormalities and we discussed dietary and lifestyle changes that could help those values (also medications when appropriate).  Extensive health counseling performed and all patient's concerns/ questions were addressed.   Hyperthyroidism: 2. He  denies increased fatigue, loss of hair, or chills.   Chronic ear effusion: 3. He reports that he may have fluid in his left ear due to a gurgling sensation to his left ear. He denies any other symptoms.      Wt Readings from Last 3 Encounters:  03/05/17 234 lb (106.1 kg)  02/05/17 235 lb 9.6 oz (106.9 kg)   BP Readings from Last 3 Encounters:  03/05/17 104/70  02/05/17 108/76   Pulse Readings from Last 3 Encounters:  03/05/17 62  02/05/17 78   BMI Readings from Last 3 Encounters:  03/05/17 31.96 kg/m  02/05/17 32.18 kg/m     Patient Care Team    Relationship Specialty Notifications Start End  [name], DO PCP - General Family Medicine  12/04/16   Vicenta Aly, [place] Nurse Practitioner Nurse Practitioner  02/05/17     Full medical history updated and reviewed in the office today  Patient Active Problem List   Diagnosis Date Noted  . Vitamin D insufficiency 03/05/2017  . S/P appendectomy 1985 or so 02/05/2017  . Family history of diverticulitis of colon 02/05/2017  . Family history of diabetes mellitus in maternal grandmother 02/05/2017  . Family history of heart disease in male family member before age 53 02/05/2017  . Environmental and seasonal allergies 02/05/2017  . Nephritis- 1972 03/19/1970    Past Medical History:  Diagnosis Date  . Nephritis 1972    Past Surgical History:  Procedure Laterality Date  . APPENDECTOMY  1984    Social History   Tobacco Use  . Smoking status: Never Smoker  . Smokeless tobacco: Former Network engineer Use Topics  . Alcohol use: Yes    Alcohol/week: 0.6 oz    Types: 1 Standard drinks or equivalent per week    Family Hx: Family History  Problem Relation Age of Onset  . Alcohol abuse Mother   . Heart attack Father   . Diabetes Maternal Grandfather      Medications: Current Outpatient Medications  Medication Sig Dispense Refill  . Vitamin D, Ergocalciferol, (DRISDOL) 50000 units CAPS capsule Take 1  capsule (50,000 Units total) by mouth every 7 (seven) days. 12 capsule 10   No current facility-administered medications for this visit.     Allergies:  Not on File   Review of Systems: General:   No F/C, wt loss Pulm:   No DIB, SOB, pleuritic chest pain Card:  No CP, palpitations Abd:  No n/v/d or pain Ext:  No inc edema from baseline  Objective:  Blood pressure 104/70, pulse 62, height 5' 11.75" (1.822 m), weight 234 lb (106.1 kg). Body mass index is 31.96 kg/m. Gen:   Well NAD, A and O *3 HEENT:    [place]/AT, EOMI,  MMM Lungs:   Normal work of breathing. CTA B/L, no Wh, rhonchi Heart:   RRR, S1, S2 WNL's, no MRG Abd:   No gross distention Exts:    warm, pink,  Brisk capillary refill, warm and well perfused.  Psych:    No HI/SI, judgement and insight good, Euthymic mood. Full Affect.   Recent Results (from the past 2160 hour(s))  VITAMIN D 25 Hydroxy (Vit-D  Deficiency, Fractures)     Status: None   Collection Time: 03/01/17  8:56 AM  Result Value Ref Range   Vit D, 25-Hydroxy 36.4 30.0 - 100.0 ng/mL    Comment: Vitamin D deficiency has been defined by the [place] practice guideline as a level of serum 25-OH vitamin D less than 20 ng/mL (1,2). The Endocrine Society went on to further define vitamin D insufficiency as a level between 21 and 29 ng/mL (2). 1. IOM (Institute of Medicine). 2010. Dietary reference    intakes for calcium and D. [place]: The    [employer]. 2. Holick MF, Binkley [place], Bischoff-Ferrari HA, et al.    Evaluation, treatment, and prevention of vitamin D    deficiency: an Endocrine Society clinical practice    guideline. JCEM. 2011 Jul; 96(7):1911-30.   T3, free     Status: None   Collection Time: 03/01/17  8:56 AM  Result Value Ref Range   T3, Free 3.1 2.0 - 4.4 pg/mL  TSH     Status: Abnormal   Collection Time: 03/01/17  8:56 AM  Result Value Ref Range   TSH 4.610 (H) 0.450 - 4.500 uIU/mL    T4, free     Status: None   Collection Time: 03/01/17  8:56 AM  Result Value Ref Range   Free T4 0.90 0.82 - 1.77 ng/dL  Lipid panel     Status: Abnormal   Collection Time: 03/01/17  8:56 AM  Result Value Ref Range   Cholesterol, Total 195 100 - 199 mg/dL   Triglycerides 114 0 - 149 mg/dL   HDL 42 >39 mg/dL   VLDL Cholesterol Cal 23 5 - 40 mg/dL   LDL Calculated 130 (H) 0 - 99 mg/dL   Chol/HDL Ratio 4.6 0.0 - 5.0 ratio    Comment:                                   T. Chol/HDL Ratio                                             Men  Women                               1/2 Avg.Risk  3.4    3.3                                   Avg.Risk  5.0    4.4                                2X Avg.Risk  9.6    7.1                                3X Avg.Risk 23.4   11.0   Hemoglobin A1c     Status: None   Collection Time: 03/01/17  8:56 AM  Result Value Ref Range   Hgb A1c MFr Bld 5.4 4.8 - 5.6 %    Comment:  Prediabetes: 5.7 - 6.4          Diabetes: >6.4          Glycemic control for adults with diabetes: <7.0    Est. average glucose Bld gHb Est-mCnc 108 mg/dL  Comprehensive metabolic panel     Status: Abnormal   Collection Time: 03/01/17  8:56 AM  Result Value Ref Range   Glucose 89 65 - 99 mg/dL   BUN 19 6 - 24 mg/dL   Creatinine, Ser 1.24 0.76 - 1.27 mg/dL   GFR calc non Af Amer 66 >59 mL/min/1.73   GFR calc Af Amer 76 >59 mL/min/1.73   BUN/Creatinine Ratio 15 9 - 20   Sodium 143 134 - 144 mmol/L   Potassium 4.4 3.5 - 5.2 mmol/L   Chloride 107 (H) 96 - 106 mmol/L   CO2 22 20 - 29 mmol/L   Calcium 9.1 8.7 - 10.2 mg/dL   Total Protein 6.6 6.0 - 8.5 g/dL   Albumin 4.1 3.5 - 5.5 g/dL   Globulin, Total 2.5 1.5 - 4.5 g/dL   Albumin/Globulin Ratio 1.6 1.2 - 2.2   Bilirubin Total 0.4 0.0 - 1.2 mg/dL   Alkaline Phosphatase 47 39 - 117 IU/L   AST 19 0 - 40 IU/L   ALT 29 0 - 44 IU/L  CBC with Differential/Platelet     Status: None   Collection Time: 03/01/17  8:56 AM  Result  Value Ref Range   WBC 6.0 3.4 - 10.8 x10E3/uL   RBC 4.65 4.14 - 5.80 x10E6/uL   Hemoglobin 14.6 13.0 - 17.7 g/dL   Hematocrit 42.0 37.5 - 51.0 %   MCV 90 79 - 97 fL   MCH 31.4 26.6 - 33.0 pg   MCHC 34.8 31.5 - 35.7 g/dL   RDW 13.7 12.3 - 15.4 %   Platelets 258 150 - 379 x10E3/uL   Neutrophils 59 Not Estab. %   Lymphs 34 Not Estab. %   Monocytes 5 Not Estab. %   Eos 2 Not Estab. %   Basos 0 Not Estab. %   Neutrophils Absolute 3.5 1.4 - 7.0 x10E3/uL   Lymphocytes Absolute 2.0 0.7 - 3.1 x10E3/uL   Monocytes Absolute 0.3 0.1 - 0.9 x10E3/uL   EOS (ABSOLUTE) 0.1 0.0 - 0.4 x10E3/uL   Basophils Absolute 0.0 0.0 - 0.2 x10E3/uL   Immature Granulocytes 0 Not Estab. %   Immature Grans (Abs) 0.0 0.0 - 0.1 x10E3/uL

## 2017-03-05 NOTE — Patient Instructions (Addendum)
F/Up 4-52mo- TSH, T4, T3, FLP, then OV with me 1 wk later.   -A Y are sinus rinse or Neomed sinus rinses twice daily.  In the morning do a rinse then do 1 spray 1 spray Flonase each nostril, then in the p.m. do the nasal rinse again and do 1 spray 1 spray Flonase again.  -Try to move more.  This will increase blood flow to all your vital organs and improve the function of your kidneys heart etc.  Try to reach goal of AHA guidelines 250 minutes a week of moderate intensity exercise. -Also try to drink at least half of your weight in ounces of water per day or non-caffeinated beverages per day.  -In 4-6 months we will recheck your thyroid panel as well as your cholesterol panel  Guidelines for a Low Cholesterol, Low Saturated Fat Diet   Fats - Limit total intake of fats and oils. - Avoid butter, stick margarine, shortening, lard, palm and coconut oils. - Limit mayonnaise, salad dressings, gravies and sauces, unless they are homemade with low-fat ingredients. - Limit chocolate. - Choose low-fat and nonfat products, such as low-fat mayonnaise, low-fat or non-hydrogenated peanut butter, low-fat or fat-free salad dressings and nonfat gravy. - Use vegetable oil, such as canola or olive oil. - Look for margarine that does not contain trans fatty acids. - Use nuts in moderate amounts. - Read ingredient labels carefully to determine both amount and type of fat present in foods. Limit saturated and trans fats! - Avoid high-fat processed and convenience foods.  Meats and Meat Alternatives - Choose fish, chicken, [place] and lean meats. - Use dried beans, peas, lentils and tofu. - Limit egg yolks to three to four per week. - If you eat red meat, limit to no more than three servings per week and choose loin or round cuts. - Avoid fatty meats, such as bacon, sausage, franks, luncheon meats and ribs. - Avoid all organ meats, including liver.  Dairy - Choose nonfat or low-fat milk, yogurt and cottage  cheese. - Most cheeses are high in fat. Choose cheeses made from non-fat milk, such as mozzarella and ricotta cheese. - Choose light or fat-free cream cheese and sour cream. - Avoid cream and sauces made with cream.  Fruits and Vegetables - Eat a wide variety of fruits and vegetables. - Use lemon juice, vinegar or "mist" olive oil on vegetables. - Avoid adding sauces, fat or oil to vegetables.  Breads, Cereals and Grains - Choose whole-grain breads, cereals, pastas and rice. - Avoid high-fat snack foods, such as granola, cookies, pies, pastries, doughnuts and croissants.  Cooking Tips - Avoid deep fried foods. - Trim visible fat off meats and remove skin from poultry before cooking. - Bake, broil, boil, poach or roast poultry, fish and lean meats. - Drain and discard fat that drains out of meat as you cook it. - Add little or no fat to foods. - Use vegetable oil sprays to grease pans for cooking or baking. - Steam vegetables. - Use herbs or no-oil marinades to flavor foods.   -This is for your information only.  Hypothyroidism Hypothyroidism is a disorder of the thyroid. The thyroid is a large gland that is located in the lower front of the neck. The thyroid releases hormones that control how the body works. With hypothyroidism, the thyroid does not make enough of these hormones. What are the causes? Causes of hypothyroidism may include:  Viral infections.  Pregnancy.  Your own defense system (immune  system) attacking your thyroid.  Certain medicines.  Birth defects.  Past radiation treatments to your head or neck.  Past treatment with radioactive iodine.  Past surgical removal of part or all of your thyroid.  Problems with the gland that is located in the center of your brain (pituitary).  What are the signs or symptoms? Signs and symptoms of hypothyroidism may include:  Feeling as though you have no energy (lethargy).  Inability to tolerate cold.  Weight  gain that is not explained by a change in diet or exercise habits.  Dry skin.  Coarse hair.  Menstrual irregularity.  Slowing of thought processes.  Constipation.  Sadness or depression.  How is this diagnosed? Your health care provider may diagnose hypothyroidism with blood tests and ultrasound tests. How is this treated? Hypothyroidism is treated with medicine that replaces the hormones that your body does not make. After you begin treatment, it may take several weeks for symptoms to go away. Follow these instructions at home:  Take medicines only as directed by your health care provider.  If you start taking any new medicines, tell your health care provider.  Keep all follow-up visits as directed by your health care provider. This is important. As your condition improves, your dosage needs may change. You will need to have blood tests regularly so that your health care provider can watch your condition. Contact a health care provider if:  Your symptoms do not get better with treatment.  You are taking thyroid replacement medicine and: ? You sweat excessively. ? You have tremors. ? You feel anxious. ? You lose weight rapidly. ? You cannot tolerate heat. ? You have emotional swings. ? You have diarrhea. ? You feel weak. Get help right away if:  You develop chest pain.  You develop an irregular heartbeat.  You develop a rapid heartbeat. This information is not intended to replace advice given to you by your health care provider. Make sure you discuss any questions you have with your health care provider. Document Released: 03/05/2005 Document Revised: 08/11/2015 Document Reviewed: 07/21/2013 Elsevier Interactive Patient Education  2017 [employer].

## 2017-06-07 ENCOUNTER — Ambulatory Visit: Payer: BC Managed Care – PPO | Admitting: Family Medicine

## 2017-06-07 ENCOUNTER — Encounter: Payer: Self-pay | Admitting: Family Medicine

## 2017-06-07 VITALS — BP 118/79 | HR 67 | Temp 98.3°F | Ht 71.75 in | Wt 238.9 lb

## 2017-06-07 DIAGNOSIS — J329 Chronic sinusitis, unspecified: Secondary | ICD-10-CM | POA: Diagnosis not present

## 2017-06-07 DIAGNOSIS — E559 Vitamin D deficiency, unspecified: Secondary | ICD-10-CM

## 2017-06-07 DIAGNOSIS — J40 Bronchitis, not specified as acute or chronic: Secondary | ICD-10-CM | POA: Diagnosis not present

## 2017-06-07 DIAGNOSIS — R05 Cough: Secondary | ICD-10-CM

## 2017-06-07 DIAGNOSIS — R058 Other specified cough: Secondary | ICD-10-CM

## 2017-06-07 MED ORDER — VITAMIN D (ERGOCALCIFEROL) 1.25 MG (50000 UNIT) PO CAPS: 50000.0000 [IU] | ORAL_CAPSULE | ORAL | 10 refills | Status: DC

## 2017-06-07 MED ORDER — PREDNISONE 20 MG PO TABS: ORAL_TABLET | ORAL | 0 refills | Status: DC

## 2017-06-07 MED ORDER — HYDROCOD POLST-CPM POLST ER 10-8 MG/5ML PO SUER: 5.0000 mL | Freq: Two times a day (BID) | ORAL | 0 refills | Status: DC | PRN

## 2017-06-07 MED ORDER — METHYLPREDNISOLONE ACETATE 40 MG/ML IJ SUSP: 80.0000 mg | Freq: Once | INTRAMUSCULAR | Status: DC

## 2017-06-07 NOTE — Progress Notes (Signed)
Acute Care Office visit  Assessment and plan:  1. Productive cough   2. Rhinosinusitis   3. Bronchitis   4. Vitamin D insufficiency      1. Rhinosinusitis  - Patient will continue with supportive care of Dayquil and/or Nyquil.  - Patient advised to drink excess amounts of water.  Eat chicken noodle soup, any kind of liquid.  - Get adequate amounts of rest and do not do anything to stress the body - no alcohol, no exercise, just rest.  - Steroids recommended due to likelihood of viral infection.  Cough medicine prescribed along with steroid injection, plus course of oral steroids.  Patient will begin cough medicine today, and prednisone tomorrow.  2. Follow-Up - Since this is day 5 of symptoms, if he is no better by 7-10 days, please give [place] a call.    He may need Augmentin and/or Z-Pak  - Patient knows to call in on Monday if his symptoms have worsened or not improved.  - At that time, if needed, we will consider antibiotics.   Meds ordered this encounter  Medications  . chlorpheniramine-HYDROcodone (TUSSIONEX) 10-8 MG/5ML SUER    Sig: Take 5 mLs by mouth every 12 (twelve) hours as needed for cough (cough, will cause drowsiness.).    Dispense:  120 mL    Refill:  0  . predniSONE (DELTASONE) 20 MG tablet    Sig: Take 3 tabs po * 2 days, then 2 tabs for 2 d, then 1 tab 2 d, then 1/2 tab 2 days.    Dispense:  15 tablet    Refill:  0  . methylPREDNISolone acetate (DEPO-MEDROL) injection 80 mg  . Vitamin D, Ergocalciferol, (DRISDOL) 50000 units CAPS capsule    Sig: Take 1 capsule (50,000 Units total) by mouth every 7 (seven) days.    Dispense:  12 capsule    Refill:  10    Gross side effects, risk and benefits, and alternatives of medications discussed with patient.  Patient is aware that all medications have potential side effects and we are unable to predict every sideeffect or drug-drug interaction that may occur.  Expresses verbal understanding and consents to  current therapy plan and treatment regiment.   Education and routine counseling performed. Handouts provided.  Anticipatory guidance and routine counseling done re: condition, txmnt options and need for follow up. All questions of patient's were answered.  Return if symptoms worsen or fail to improve, for Let me know by Monday-Wednesday if no improvement will start ABX.  Please see AVS handed out to patient at the end of our visit for additional patient instructions/ counseling done pertaining to today's office visit.  Note: This document was partially repared using Dragon voice recognition software and may include unintentional dictation errors.  This document serves as a record of services personally performed by [name], DO. It was created on her behalf by [name], a trained medical scribe. The creation of this record is based on the scribe's personal observations and the provider's statements to them.   I have reviewed the above medical documentation for accuracy and completeness and I concur.  [name] 06/07/17 12:19 PM    Subjective:    Chief Complaint  Patient presents with  . Cough    productive x 4 days     HPI:  Pt presents with Sx for 4 days - today is day 5.  Wife is sick.   C/o:  Congestion, mild coughing - typically dry  cough, not always productive.  Has been staying busy until yesterday afternoon.  Throat got sore last night.  Hasn't taken anything since last night.  Just feels bad.  Malaise.  Denies:  Denies SOB, wheezing.  Denies issues sleeping.  Denies loss of appetite.    For symptoms patient has tried:  Nyquil, Dayquil, ibuprofen - has been taking these regularly.  Overall getting:   Feeling worse today.  Patient got worried only since it got worse.  Notes that it's rare for him to feel like this, but in the past he usually takes medicine to knock it, and if he can't knock it he has been prescribed  antibiotics.   Patient Care Team    Relationship Specialty Notifications Start End  [name], DO PCP - General Family Medicine  12/04/16   Vicenta Aly, [place] Nurse Practitioner Nurse Practitioner  02/05/17     Past medical history, Surgical history, Family history reviewed and noted below, Social history, Allergies, and Medications have been entered into the medical record, reviewed and changed as needed.   Not on File  Review of Systems: - see above HPI for pertinent positives General:   No F/C, wt loss Pulm:   No DIB, pleuritic chest pain Card:  No CP, palpitations Abd:  No n/v/d or pain Ext:  No inc edema from baseline   Objective:   Blood pressure 118/79, pulse 67, temperature 98.3 F (36.8 C), height 5' 11.75" (1.822 m), weight 238 lb 14.4 oz (108.4 kg), SpO2 99 %. Body mass index is 32.63 kg/m. General: Well Developed, well nourished, appropriate for stated age.  Neuro: Alert and oriented x3, extra-ocular muscles intact, sensation grossly intact.  HEENT: Normocephalic, atraumatic, pupils equal round reactive to light, neck supple, no masses, no painful lymphadenopathy, TM's intact B/L, no acute findings. Nares- patent, clear d/c, OP- clear, erythematous and red, No TTP sinuses Skin: Warm and dry, no gross rash. Cardiac: RRR, S1 S2,  no murmurs rubs or gallops.  Respiratory: ECTA B/L and A/P, Not using accessory muscles, speaking in full sentences- unlabored. Vascular:  No gross lower ext edema, cap RF less 2 sec. Psych: No HI/SI, judgement and insight good, Euthymic mood. Full Affect.

## 2017-06-07 NOTE — Patient Instructions (Signed)
Please continue with the supportive care of DayQuil and/or NyQuil.  Please drink excessive amounts of water and or chicken noodle soup etc. -Please get adequate amounts of rest and do not do anything to stress your body -Since this is day 5 of your symptoms today if you are no better by 7-10 days please give [place] a call and we will prescribe antibiotics next week.    -Please start the prednisone today as well as the cough medicine.  Z-Pak or augmentin is what I would recommend.

## 2017-06-10 ENCOUNTER — Ambulatory Visit: Payer: BC Managed Care – PPO | Admitting: Family Medicine

## 2017-07-30 ENCOUNTER — Other Ambulatory Visit: Payer: Self-pay

## 2017-07-30 ENCOUNTER — Other Ambulatory Visit: Payer: BC Managed Care – PPO

## 2017-07-30 DIAGNOSIS — Z8249 Family history of ischemic heart disease and other diseases of the circulatory system: Secondary | ICD-10-CM

## 2017-07-30 DIAGNOSIS — E039 Hypothyroidism, unspecified: Secondary | ICD-10-CM

## 2017-07-30 DIAGNOSIS — E038 Other specified hypothyroidism: Secondary | ICD-10-CM

## 2017-07-31 LAB — LIPID PANEL
CHOL/HDL RATIO: 4.7 ratio (ref 0.0–5.0)
Cholesterol, Total: 222 mg/dL — ABNORMAL HIGH (ref 100–199)
HDL: 47 mg/dL (ref >39)
LDL CALC: 149 mg/dL — AB (ref 0–99)
Triglycerides: 128 mg/dL (ref 0–149)
VLDL Cholesterol Cal: 26 mg/dL (ref 5–40)

## 2017-07-31 LAB — TSH: TSH: 4.89 u[IU]/mL — AB (ref 0.450–4.500)

## 2017-07-31 LAB — T3, FREE: T3, Free: 3.1 pg/mL (ref 2.0–4.4)

## 2017-07-31 LAB — T4, FREE: FREE T4: 0.91 ng/dL (ref 0.82–1.77)

## 2017-08-06 ENCOUNTER — Ambulatory Visit: Payer: BC Managed Care – PPO | Admitting: Family Medicine

## 2017-08-06 VITALS — BP 115/81 | HR 64 | Ht 72.0 in | Wt 238.4 lb

## 2017-08-06 DIAGNOSIS — Z8249 Family history of ischemic heart disease and other diseases of the circulatory system: Secondary | ICD-10-CM | POA: Diagnosis not present

## 2017-08-06 DIAGNOSIS — E7800 Pure hypercholesterolemia, unspecified: Secondary | ICD-10-CM | POA: Diagnosis not present

## 2017-08-06 DIAGNOSIS — E669 Obesity, unspecified: Secondary | ICD-10-CM

## 2017-08-06 DIAGNOSIS — E785 Hyperlipidemia, unspecified: Secondary | ICD-10-CM | POA: Insufficient documentation

## 2017-08-06 DIAGNOSIS — E038 Other specified hypothyroidism: Secondary | ICD-10-CM

## 2017-08-06 DIAGNOSIS — E039 Hypothyroidism, unspecified: Secondary | ICD-10-CM | POA: Diagnosis not present

## 2017-08-06 NOTE — Patient Instructions (Addendum)
Follow-up in December for complete physical-come fasting so we can get blood work as well.  Guidelines for a Low Cholesterol, Low Saturated Fat Diet   Fats - Limit total intake of fats and oils. - Avoid butter, stick margarine, shortening, lard, palm and coconut oils. - Limit mayonnaise, salad dressings, gravies and sauces, unless they are homemade with low-fat ingredients. - Limit chocolate. - Choose low-fat and nonfat products, such as low-fat mayonnaise, low-fat or non-hydrogenated peanut butter, low-fat or fat-free salad dressings and nonfat gravy. - Use vegetable oil, such as canola or olive oil. - Look for margarine that does not contain trans fatty acids. - Use nuts in moderate amounts. - Read ingredient labels carefully to determine both amount and type of fat present in foods. Limit saturated and trans fats! - Avoid high-fat processed and convenience foods.  Meats and Meat Alternatives - Choose fish, chicken, [place] and lean meats. - Use dried beans, peas, lentils and tofu. - Limit egg yolks to three to four per week. - If you eat red meat, limit to no more than three servings per week and choose loin or round cuts. - Avoid fatty meats, such as bacon, sausage, franks, luncheon meats and ribs. - Avoid all organ meats, including liver.  Dairy - Choose nonfat or low-fat milk, yogurt and cottage cheese. - Most cheeses are high in fat. Choose cheeses made from non-fat milk, such as mozzarella and ricotta cheese. - Choose light or fat-free cream cheese and sour cream. - Avoid cream and sauces made with cream.  Fruits and Vegetables - Eat a wide variety of fruits and vegetables. - Use lemon juice, vinegar or "mist" olive oil on vegetables. - Avoid adding sauces, fat or oil to vegetables.  Breads, Cereals and Grains - Choose whole-grain breads, cereals, pastas and rice. - Avoid high-fat snack foods, such as granola, cookies, pies, pastries, doughnuts and croissants.  Cooking  Tips - Avoid deep fried foods. - Trim visible fat off meats and remove skin from poultry before cooking. - Bake, broil, boil, poach or roast poultry, fish and lean meats. - Drain and discard fat that drains out of meat as you cook it. - Add little or no fat to foods. - Use vegetable oil sprays to grease pans for cooking or baking. - Steam vegetables. - Use herbs or no-oil marinades to flavor foods.  Nine ways to increase your "good" HDL cholesterol  High-density lipoprotein (HDL) is often referred to as the "good" cholesterol. Having high HDL levels helps carry cholesterol from your arteries to your liver, where it can be used or excreted.  Having high levels of HDL also has antioxidant and anti-inflammatory effects, and is linked to a reduced risk of heart disease (1, 2).  Most health experts recommend minimum blood levels of 40 mg/dl in men and 50 mg/dl in women.  While genetics definitely play a role, there are several other factors that affect HDL levels.  Here are nine healthy ways to raise your "good" HDL cholesterol.  1. Consume olive oil  two pieces of salmon on a plate olive oil being poured into a small dish Extra virgin olive oil may be more healthful than processed olive oils. Olive oil is one of the healthiest fats around.  A large analysis of 42 studies with more than 800,000 participants found that olive oil was the only source of monounsaturated fat that seemed to reduce heart disease risk (3).  Research has shown that one of olive oil's heart-healthy effects is  an increase in HDL cholesterol. This effect is thought to be caused by antioxidants it contains called polyphenols (4, 5, 6, 7).  Extra virgin olive oil has more polyphenols than more processed olive oils, although the amount can still vary among different types and brands.  One study gave 200 healthy young men about 2 tablespoons (25 ml) of different olive oils per day for three weeks.  The researchers  found that participants' HDL levels increased significantly more after they consumed the olive oil with the highest polyphenol content (6).  In another study, when 90 older adults consumed about 4 tablespoons (50 ml) of high-polyphenol extra virgin olive oil every day for six weeks, their HDL cholesterol increased by 6.5 mg/dl, on average (7).  In addition to raising HDL levels, olive oil has been found to boost HDL's anti-inflammatory and antioxidant function in studies of older people and individuals with high cholesterol levels ( 7, 8, 9).  Whenever possible, select high-quality, certified extra virgin olive oils, which tend to be highest in polyphenols.  Bottom line: Extra virgin olive oil with a high polyphenol content has been shown to increase HDL levels in healthy people, the elderly and individuals with high cholesterol.  2. Follow a low-carb or ketogenic diet  Low-carb and ketogenic diets provide a number of health benefits, including weight loss and reduced blood sugar levels.  They have also been shown to increase HDL cholesterol in people who tend to have lower levels.  This includes those who are obese, insulin resistant or diabetic (10, 11, 12, 13, 14, 15, 16, 17).  In one study, people with type 2 diabetes were split into two groups.  One followed a diet consuming less than 50 grams of carbs per day. The other followed a high-carb diet.  Although both groups lost weight, the low-carb group's HDL cholesterol increased almost twice as much as the high-carb group's did (14).  In another study, obese people who followed a low-carb diet experienced an increase in HDL cholesterol of 5 mg/dl overall.  Meanwhile, in the same study, the participants who ate a low-fat, high-carb diet showed a decrease in HDL cholesterol (15).  This response may partially be due to the higher levels of fat people typically consume on low-carb diets.  One study in overweight women found that diets  high in meat and cheese increased HDL levels by 5-8%, compared to a higher-carb diet (18).  What's more, in addition to raising HDL cholesterol, very-low-carb diets have been shown to decrease triglycerides and improve several other risk factors for heart disease (13, 14, 16, 17).  Bottom line: Low-carb and ketogenic diets typically increase HDL cholesterol levels in people with diabetes, metabolic syndrome and obesity.  3. Exercise regularly  Being physically active is important for heart health.  Studies have shown that many different types of exercise are effective at raising HDL cholesterol, including strength training, high-intensity exercise and aerobic exercise (19, 20, 21, 22, 23, 24).  However, the biggest increases in HDL are typically seen with high-intensity exercise.  One small study followed women who were living with polycystic ovary syndrome (PCOS), which is linked to a higher risk of insulin resistance. The study required them to perform high-intensity exercise three times a week.  The exercise led to an increase in HDL cholesterol of 8 mg/dL after 10 weeks. The women also showed improvements in other health markers, including decreased insulin resistance and improved arterial function (23).  In a 12-week study, overweight men who performed high-intensity  exercise experienced a 10% increase in HDL cholesterol.  In contrast, the low-intensity exercise group showed only a 2% increase and the endurance training group experienced no change (24).  However, even lower-intensity exercise seems to increase HDL's anti-inflammatory and antioxidant capacities, whether or not HDL levels change (20, 21, 25).  Overall, high-intensity exercise such as high-intensity interval training (HIIT) and high-intensity circuit training (HICT) may boost HDL cholesterol levels the most.  Bottom line: Exercising several times per week can help raise HDL cholesterol and enhance its anti-inflammatory  and antioxidant effects. High-intensity forms of exercise may be especially effective.  4. Add coconut oil to your diet  Studies have shown that coconut oil may reduce appetite, increase metabolic rate and help protect brain health, among other benefits.  Some people may be concerned about coconut oil's effects on heart health due to its high saturated fat content.  However, it appears that coconut oil is actually quite heart healthy.  Coconut oil tends to raise HDL cholesterol more than many other types of fat.  In addition, it may improve the ratio of low-density-lipoprotein (LDL) cholesterol, the "bad" cholesterol, to HDL cholesterol. Improving this ratio reduces heart disease risk (26, 27, 28, 29).  One study examined the health effects of coconut oil on 31 women with excess belly fat. The researchers found that participants who took coconut oil daily experienced increased HDL cholesterol and a lower LDL-to-HDL ratio.  In contrast, the group who took soybean oil daily had a decrease in HDL cholesterol and an increase in the LDL-to-HDL ratio (29).  Most studies have found these health benefits occur at a dosage of about 2 tablespoons (30 ml) of coconut oil per day. It's best to incorporate this into cooking rather than eating spoonfuls of coconut oil on their own.  Bottom line: Consuming 2 tablespoons (30 ml) of coconut oil per day may help increase HDL cholesterol levels.  5. Stop smoking  cigarette butt Quitting smoking can reduce the risk of heart disease and lung cancer. Smoking increases the risk of many health problems, including heart disease and lung cancer (30).  One of its negative effects is a suppression of HDL cholesterol.  Some studies have found that quitting smoking can increase HDL levels. Indeed, one study found no significant differences in HDL levels between former smokers and people who had never smoked (31, 32, 33, 34, 35).  In a one-year study of more than  1,500 people, those who quit smoking had twice the increase in HDL as those who resumed smoking within the year. The number of large HDL particles also increased, which further reduced heart disease risk (32).  One study followed smokers who switched from traditional cigarettes to electronic cigarettes for one year. They found that the switch was associated with an increase in HDL cholesterol of 5 mg/dl, on average (33).  When it comes to the effect of nicotine replacement patches on HDL levels, research results have been mixed.  One study found that nicotine replacement therapy led to higher HDL cholesterol. However, other research suggests that people who use nicotine patches likely won't see increases in HDL levels until after replacement therapy is completed (34, 36).  Even in studies where HDL cholesterol levels didn't increase after people quit smoking, HDL function improved, resulting in less inflammation and other beneficial effects on heart health (37).  Bottom line: Quitting smoking can increase HDL levels, improve HDL function and help protect heart health.  6. Lose weight  When overweight and obese people  lose weight, their HDL cholesterol levels usually increase.  What's more, this benefit seems to occur whether weight loss is achieved by calorie counting, carb restriction, intermittent fasting, weight loss surgery or a combination of diet and exercise (16, 38, 39, 40, 41, 42).  One study examined HDL levels in more than 3,000 overweight and obese [place] adults who followed a lifestyle modification program for one year.  The researchers found that losing at least 6.6 lbs (3 kg) led to an increase in HDL cholesterol of 4 mg/dl, on average (41).  In another study, when obese people with type 2 diabetes consumed calorie-restricted diets that provided 20-30% of calories from protein, they experienced significant increases in HDL cholesterol levels (42).  The key to achieving and  maintaining healthy HDL cholesterol levels is choosing the type of diet that makes it easiest for you to lose weight and keep it off.  Bottom Line: Several methods of weight loss have been shown to increase HDL cholesterol levels in people who are overweight or obese.  7. Choose purple produce  Consuming purple-colored fruits and vegetables is a delicious way to potentially increase HDL cholesterol.  Purple produce contains antioxidants known as anthocyanins.  Studies using anthocyanin extracts have shown that they help fight inflammation, protect your cells from damaging free radicals and may also raise HDL cholesterol levels (43, 44, 45, 46).  In a 24-week study of 67 people with diabetes, those who took an anthocyanin supplement twice a day experienced a 19% increase in HDL cholesterol, on average, along with other improvements in heart health markers (45).  In another study, when people with cholesterol issues took anthocyanin extract for 12 weeks, their HDL cholesterol levels increased by 13.7% (46).  Although these studies used extracts instead of foods, there are several fruits and vegetables that are very high in anthocyanins. These include eggplant, purple corn, red cabbage, blueberries, blackberries and black raspberries.  Bottom line: Consuming fruits and vegetables rich in anthocyanins may help increase HDL cholesterol levels.  8. Eat fatty fish often  The omega-3 fats in fatty fish provide major benefits to heart health, including a reduction in inflammation and better functioning of the cells that line your arteries (47, 48).  There's some research showing that eating fatty fish or taking fish oil may also help raise low levels of HDL cholesterol (49, 50, 51, 52, 53).  In a study of 33 heart disease patients, participants that consumed fatty fish four times per week experienced an increase in HDL cholesterol levels. The particle size of their HDL also increased (52).  In  another study, overweight men who consumed herring five days a week for six weeks had a 5% increase in HDL cholesterol, compared with their levels after eating lean pork and chicken five days a week (53).  However, there are a few studies that found no increase in HDL cholesterol in response to increased fish or omega-3 supplement intake (54, 55).  In addition to herring, other types of fatty fish that may help raise HDL cholesterol include salmon, sardines, mackerel and anchovies.  Bottom line: Eating fatty fish several times per week may help increase HDL cholesterol levels and provide other benefits to heart health.  9. Avoid artificial trans fats  Artificial trans fats have many negative health effects due to their inflammatory properties (56, 57).  There are two types of trans fats. One kind occurs naturally in animal products, including full-fat dairy.  In contrast, the artificial trans fats found in margarines  and processed foods are created by adding hydrogen to unsaturated vegetable and seed oils. These fats are also known as industrial trans fats or partially hydrogenated fats.  Research has shown that, in addition to increasing inflammation and contributing to several health problems, these artificial trans fats may lower HDL cholesterol levels.  In one study, researchers compared how people's HDL levels responded when they consumed different margarines.  The study found that participants' HDL cholesterol levels were 10% lower after consuming margarine containing partially hydrogenated soybean oil, compared to their levels after consuming palm oil (58).  Another controlled study followed 40 adults who had diets high in different types of trans fats.  They found that HDL cholesterol levels in women were significantly lower after they consumed the diet high in industrial trans fats, compared to the diet containing naturally occurring trans fats (59).  To protect heart health and  keep HDL cholesterol in the healthy range, it's best to avoid artificial trans fats altogether.  Bottom line: Artificial trans fats have been shown to lower HDL levels and increase inflammation, compared to other fats.  Take home message  Although your HDL cholesterol levels are partly determined by your genetics, there are many things you can do to naturally increase your own levels.  Fortunately, the practices that raise HDL cholesterol often provide other health benefits as well.

## 2017-08-06 NOTE — Progress Notes (Signed)
Assessment and plan:  1. Subclinical hypothyroidism   2. Family history of heart disease in male family member before age 54   3. Elevated low density lipoprotein (LDL) cholesterol level   4. Obesity, Class I, BMI 30-34.9      1. Subclinical hypothyroidism -Pt is completely asymptomatic. Both T3 and T4 are WNL. Continue to monitor, recheck in 4-6 months.  2. FMHx heart disease -will continue to monitor cholesterol levels closely.  3. Elevated LDL -Pt's 10 year ASCVD risk is 4.9% today. Will not start meds at this time.  -Reduce intake of saturated and trans fats, as well as fatty carbohydrates. Reduce intake of red meats. Eat more lean proteins, chicken, fish, etc.  -Handouts and information provided.  -Prudent diet and exercise discussed.   4. Obesity -recommend losing weight. continue your daily exercise.   -use 05% salicylic acid on the area BID. Put this on right after taking a shower. Cover this with a bandage. Continue this for 2 weeks even if it appears to be normal.    Education and routine counseling performed. Handouts provided.  No orders of the defined types were placed in this encounter.   No orders of the defined types were placed in this encounter.    Return for early Dec for CPE -come fasting.   Anticipatory guidance and routine counseling done re: condition, txmnt options and need for follow up. All questions of patient's were answered.   Gross side effects, risk and benefits, and alternatives of medications discussed with patient.  Patient is aware that all medications have potential side effects and we are unable to predict every sideeffect or drug-drug interaction that may occur.  Expresses verbal understanding and consents to current therapy plan and treatment regiment.  Please see AVS handed out to patient at the end of our visit for additional patient instructions/ counseling done  pertaining to today's office visit.  Note: This document was prepared using Dragon voice recognition software and may include unintentional dictation errors.   This document serves as a record of services personally performed by [name], DO. It was created on her behalf by Mayer Masker, a trained medical scribe. The creation of this record is based on the scribe's personal observations and the provider's statements to them.   I have reviewed the above medical documentation for accuracy and completeness and I concur.  [name] 08/06/17 10:07 AM   ----------------------------------------------------------------------------------------------------------------------  Subjective:   CC:   Ryan [name] is a 52 y.o. male who presents to [facility] today for review and discussion of recent bloodwork that was done.  1. All recent blood work that we ordered was reviewed with patient today.  Patient was counseled on all abnormalities and we discussed dietary and lifestyle changes that could help those values (also medications when appropriate).  Extensive health counseling performed and all patient's concerns/ questions were addressed.   He is taking vitamin D supplements regularly.  Hypothyroidism TSH last OV 5 months ago was 4.610.   7 days ago it was 4.890.   Both free T3 and T4 are WNL.   He denies tiredness or sluggishness.   He has a chronic h/o elevated TSH in the past.   He wakes up well-rested at 4:30-5 AM.   He goes to sleep immediately at 9pm.   He denies being tired in the afternoon.  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   Exercise/diet He is walking more (and walking his dog 0.25-0.5 miles), playing golf (riding cart). Per pt, he eats too much butter but has otherwise has a regular, healthy diet.   L hand He complains of a wart on his L hand that appeared 1 year ago. He has tried a  topical treatment but has not used it in some time. He thought it went away but it came back. He has a h/o warts across areas on his body.    Wt Readings from Last 3 Encounters:  08/06/17 238 lb 6.4 oz (108.1 kg)  06/07/17 238 lb 14.4 oz (108.4 kg)  03/05/17 234 lb (106.1 kg)   BP Readings from Last 3 Encounters:  08/06/17 115/81  06/07/17 118/79  03/05/17 104/70   Pulse Readings from Last 3 Encounters:  08/06/17 64  06/07/17 67  03/05/17 62   BMI Readings from Last 3 Encounters:  08/06/17 32.33 kg/m  06/07/17 32.63 kg/m  03/05/17 31.96 kg/m     Patient Care Team    Relationship Specialty Notifications Start End  [name], DO PCP - General Family Medicine  12/04/16   Vicenta Aly, Ila Nurse Practitioner Nurse Practitioner  02/05/17     Full medical history updated and reviewed in the office today  Patient Active Problem List   Diagnosis Date Noted  . Subclinical hypothyroidism 08/06/2017  . Elevated low density lipoprotein (LDL) cholesterol level 08/06/2017  . Obesity, Class I, BMI 30-34.9 08/06/2017  . Vitamin D insufficiency 03/05/2017  . S/P appendectomy 1985 or so 02/05/2017  . Family history of diverticulitis of colon 02/05/2017  . Family history of diabetes mellitus in maternal grandmother 02/05/2017  . Family history of heart disease in male family member before age 77 02/05/2017  . Environmental and seasonal allergies 02/05/2017  . Nephritis- 1972 03/19/1970    Past Medical History:  Diagnosis Date  . Nephritis 1972    Past Surgical History:  Procedure Laterality Date  . APPENDECTOMY  1984    Social History   Tobacco Use  . Smoking status: Never Smoker  . Smokeless tobacco: Former Network engineer Use Topics  . Alcohol use: Yes    Alcohol/week: 0.6 oz    Types: 1 Standard drinks or equivalent per week    Family Hx: Family History  Problem Relation Age of Onset  . Alcohol abuse Mother   . Heart attack Father   . Diabetes  Maternal Grandfather      Medications: Current Outpatient Medications  Medication Sig Dispense Refill  . Vitamin D, Ergocalciferol, (DRISDOL) 50000 units CAPS capsule Take 1 capsule (50,000 Units total) by mouth every 7 (seven) days. 12 capsule 10   No current facility-administered medications for this visit.     Allergies:  Not on File   Review of Systems: General:   No F/C, wt loss Pulm:   No DIB, SOB, pleuritic chest pain Card:  No CP, palpitations Abd:  No n/v/d or pain Ext:  No inc edema from baseline  Objective:  Blood pressure 115/81, pulse 64, height 6' (1.829 m), weight 238 lb 6.4 oz (108.1 kg), SpO2 97 %. Body mass index is 32.33 kg/m. Gen:   Well NAD, A and O *3 HEENT:    [place]/AT, EOMI,  MMM Lungs:   Normal work of breathing. CTA B/L, no Wh, rhonchi Heart:   RRR, S1, S2 WNL's, no MRG Abd:   No gross distention Exts:    warm,  pink,  Brisk capillary refill, warm and well perfused.  Psych:    No HI/SI, judgement and insight good, Euthymic mood. Full Affect.   Recent Results (from the past 2160 hour(s))  Lipid panel     Status: Abnormal   Collection Time: 07/30/17  8:22 AM  Result Value Ref Range   Cholesterol, Total 222 (H) 100 - 199 mg/dL   Triglycerides 128 0 - 149 mg/dL   HDL 47 >39 mg/dL   VLDL Cholesterol Cal 26 5 - 40 mg/dL   LDL Calculated 149 (H) 0 - 99 mg/dL   Chol/HDL Ratio 4.7 0.0 - 5.0 ratio    Comment:                                   T. Chol/HDL Ratio                                             Men  Women                               1/2 Avg.Risk  3.4    3.3                                   Avg.Risk  5.0    4.4                                2X Avg.Risk  9.6    7.1                                3X Avg.Risk 23.4   11.0   T3, free     Status: None   Collection Time: 07/30/17  8:22 AM  Result Value Ref Range   T3, Free 3.1 2.0 - 4.4 pg/mL  T4, free     Status: None   Collection Time: 07/30/17  8:22 AM  Result Value Ref Range   Free T4  0.91 0.82 - 1.77 ng/dL  TSH     Status: Abnormal   Collection Time: 07/30/17  8:22 AM  Result Value Ref Range   TSH 4.890 (H) 0.450 - 4.500 uIU/mL

## 2018-02-05 ENCOUNTER — Other Ambulatory Visit (INDEPENDENT_AMBULATORY_CARE_PROVIDER_SITE_OTHER): Payer: BC Managed Care – PPO

## 2018-02-05 ENCOUNTER — Other Ambulatory Visit: Payer: Self-pay

## 2018-02-05 DIAGNOSIS — E559 Vitamin D deficiency, unspecified: Secondary | ICD-10-CM

## 2018-02-05 DIAGNOSIS — E038 Other specified hypothyroidism: Secondary | ICD-10-CM

## 2018-02-05 DIAGNOSIS — E7800 Pure hypercholesterolemia, unspecified: Secondary | ICD-10-CM

## 2018-02-05 DIAGNOSIS — E039 Hypothyroidism, unspecified: Secondary | ICD-10-CM

## 2018-02-05 DIAGNOSIS — Z0000 Encounter for general adult medical examination without abnormal findings: Secondary | ICD-10-CM

## 2018-02-06 LAB — COMPREHENSIVE METABOLIC PANEL
ALT: 25 IU/L (ref 0–44)
AST: 19 IU/L (ref 0–40)
Albumin/Globulin Ratio: 1.8 (ref 1.2–2.2)
Albumin: 4.3 g/dL (ref 3.5–5.5)
Alkaline Phosphatase: 51 IU/L (ref 39–117)
BUN / CREAT RATIO: 13 (ref 9–20)
BUN: 17 mg/dL (ref 6–24)
Bilirubin Total: 0.5 mg/dL (ref 0.0–1.2)
CALCIUM: 9.3 mg/dL (ref 8.7–10.2)
CO2: 22 mmol/L (ref 20–29)
CREATININE: 1.35 mg/dL — AB (ref 0.76–1.27)
Chloride: 106 mmol/L (ref 96–106)
GFR calc Af Amer: 68 mL/min/{1.73_m2} (ref >59)
GFR calc non Af Amer: 59 mL/min/{1.73_m2} — ABNORMAL LOW (ref >59)
Globulin, Total: 2.4 g/dL (ref 1.5–4.5)
Glucose: 90 mg/dL (ref 65–99)
Potassium: 4.6 mmol/L (ref 3.5–5.2)
Sodium: 142 mmol/L (ref 134–144)
TOTAL PROTEIN: 6.7 g/dL (ref 6.0–8.5)

## 2018-02-06 LAB — LIPID PANEL
CHOLESTEROL TOTAL: 225 mg/dL — AB (ref 100–199)
Chol/HDL Ratio: 4.9 ratio (ref 0.0–5.0)
HDL: 46 mg/dL (ref >39)
LDL Calculated: 153 mg/dL — ABNORMAL HIGH (ref 0–99)
Triglycerides: 130 mg/dL (ref 0–149)
VLDL CHOLESTEROL CAL: 26 mg/dL (ref 5–40)

## 2018-02-06 LAB — CBC WITH DIFFERENTIAL/PLATELET
Basophils Absolute: 0.1 10*3/uL (ref 0.0–0.2)
Basos: 1 %
EOS (ABSOLUTE): 0.1 10*3/uL (ref 0.0–0.4)
EOS: 2 %
HEMATOCRIT: 44.3 % (ref 37.5–51.0)
HEMOGLOBIN: 15.1 g/dL (ref 13.0–17.7)
IMMATURE GRANULOCYTES: 0 %
Immature Grans (Abs): 0 10*3/uL (ref 0.0–0.1)
LYMPHS ABS: 1.5 10*3/uL (ref 0.7–3.1)
Lymphs: 21 %
MCH: 31 pg (ref 26.6–33.0)
MCHC: 34.1 g/dL (ref 31.5–35.7)
MCV: 91 fL (ref 79–97)
MONOCYTES: 9 %
Monocytes Absolute: 0.7 10*3/uL (ref 0.1–0.9)
Neutrophils Absolute: 5 10*3/uL (ref 1.4–7.0)
Neutrophils: 67 %
Platelets: 262 10*3/uL (ref 150–450)
RBC: 4.87 x10E6/uL (ref 4.14–5.80)
RDW: 12.4 % (ref 12.3–15.4)
WBC: 7.4 10*3/uL (ref 3.4–10.8)

## 2018-02-06 LAB — T4, FREE: FREE T4: 0.89 ng/dL (ref 0.82–1.77)

## 2018-02-06 LAB — HEMOGLOBIN A1C
ESTIMATED AVERAGE GLUCOSE: 111 mg/dL
Hgb A1c MFr Bld: 5.5 % (ref 4.8–5.6)

## 2018-02-06 LAB — TSH: TSH: 5.24 u[IU]/mL — AB (ref 0.450–4.500)

## 2018-02-06 LAB — VITAMIN D 25 HYDROXY (VIT D DEFICIENCY, FRACTURES): Vit D, 25-Hydroxy: 59.1 ng/mL (ref 30.0–100.0)

## 2018-02-11 ENCOUNTER — Other Ambulatory Visit: Payer: Self-pay

## 2018-02-11 DIAGNOSIS — E7800 Pure hypercholesterolemia, unspecified: Secondary | ICD-10-CM

## 2018-02-11 DIAGNOSIS — N059 Unspecified nephritic syndrome with unspecified morphologic changes: Secondary | ICD-10-CM

## 2018-02-11 DIAGNOSIS — E559 Vitamin D deficiency, unspecified: Secondary | ICD-10-CM

## 2018-02-11 DIAGNOSIS — R7989 Other specified abnormal findings of blood chemistry: Secondary | ICD-10-CM

## 2018-02-17 ENCOUNTER — Ambulatory Visit (INDEPENDENT_AMBULATORY_CARE_PROVIDER_SITE_OTHER): Payer: BC Managed Care – PPO | Admitting: Adult Health

## 2018-02-17 VITALS — BP 93/59 | HR 100 | Temp 98.9°F | Ht 72.0 in | Wt 234.1 lb

## 2018-02-17 DIAGNOSIS — R000 Tachycardia, unspecified: Secondary | ICD-10-CM | POA: Diagnosis not present

## 2018-02-17 DIAGNOSIS — R0600 Dyspnea, unspecified: Secondary | ICD-10-CM

## 2018-02-17 DIAGNOSIS — J40 Bronchitis, not specified as acute or chronic: Secondary | ICD-10-CM | POA: Diagnosis not present

## 2018-02-17 MED ORDER — HYDROCOD POLST-CPM POLST ER 10-8 MG/5ML PO SUER: 5.0000 mL | Freq: Two times a day (BID) | ORAL | 0 refills | Status: DC | PRN

## 2018-02-17 MED ORDER — AZITHROMYCIN 250 MG PO TABS: ORAL_TABLET | ORAL | 0 refills | Status: DC

## 2018-02-17 NOTE — Assessment & Plan Note (Signed)
HR 100 at rest HR when walking in clinic as high as 208 EKG- NSR If palpitations/fatigue continue, f/u with Dr. Raliegh Scarlet

## 2018-02-17 NOTE — Patient Instructions (Addendum)
Acute Bronchitis, Adult Acute bronchitis is sudden (acute) swelling of the air tubes (bronchi) in the lungs. Acute bronchitis causes these tubes to fill with mucus, which can make it hard to breathe. It can also cause coughing or wheezing. In adults, acute bronchitis usually goes away within 2 weeks. A cough caused by bronchitis may last up to 3 weeks. Smoking, allergies, and asthma can make the condition worse. Repeated episodes of bronchitis may cause further lung problems, such as chronic obstructive pulmonary disease (COPD). What are the causes? This condition can be caused by germs and by substances that irritate the lungs, including:  Cold and flu viruses. This condition is most often caused by the same virus that causes a cold.  Bacteria.  Exposure to tobacco smoke, dust, fumes, and air pollution.  What increases the risk? This condition is more likely to develop in people who:  Have close contact with someone with acute bronchitis.  Are exposed to lung irritants, such as tobacco smoke, dust, fumes, and vapors.  Have a weak immune system.  Have a respiratory condition such as asthma.  What are the signs or symptoms? Symptoms of this condition include:  A cough.  Coughing up clear, yellow, or green mucus.  Wheezing.  Chest congestion.  Shortness of breath.  A fever.  Body aches.  Chills.  A sore throat.  How is this diagnosed? This condition is usually diagnosed with a physical exam. During the exam, your health care provider may order tests, such as chest X-rays, to rule out other conditions. He or she may also:  Test a sample of your mucus for bacterial infection.  Check the level of oxygen in your blood. This is done to check for pneumonia.  Do a chest X-ray or lung function testing to rule out pneumonia and other conditions.  Perform blood tests.  Your health care provider will also ask about your symptoms and medical history. How is this  treated? Most cases of acute bronchitis clear up over time without treatment. Your health care provider may recommend:  Drinking more fluids. Drinking more makes your mucus thinner, which may make it easier to breathe.  Taking a medicine for a fever or cough.  Taking an antibiotic medicine.  Using an inhaler to help improve shortness of breath and to control a cough.  Using a cool mist vaporizer or humidifier to make it easier to breathe.  Follow these instructions at home: Medicines  Take over-the-counter and prescription medicines only as told by your health care provider.  If you were prescribed an antibiotic, take it as told by your health care provider. Do not stop taking the antibiotic even if you start to feel better. General instructions  Get plenty of rest.  Drink enough fluids to keep your urine clear or pale yellow.  Avoid smoking and secondhand smoke. Exposure to cigarette smoke or irritating chemicals will make bronchitis worse. If you smoke and you need help quitting, ask your health care provider. Quitting smoking will help your lungs heal faster.  Use an inhaler, cool mist vaporizer, or humidifier as told by your health care provider.  Keep all follow-up visits as told by your health care provider. This is important. How is this prevented? To lower your risk of getting this condition again:  Wash your hands often with soap and water. If soap and water are not available, use hand sanitizer.  Avoid contact with people who have cold symptoms.  Try not to touch your hands to your   mouth, nose, or eyes.  Make sure to get the flu shot every year.  Contact a health care provider if:  Your symptoms do not improve in 2 weeks of treatment. Get help right away if:  You cough up blood.  You have chest pain.  You have severe shortness of breath.  You become dehydrated.  You faint or keep feeling like you are going to faint.  You keep vomiting.  You have a  severe headache.  Your fever or chills gets worse. This information is not intended to replace advice given to you by your health care provider. Make sure you discuss any questions you have with your health care provider. Document Released: 04/12/2004 Document Revised: 09/28/2015 Document Reviewed: 08/24/2015 Elsevier Interactive Patient Education  2018 [employer].   Please take Azithromycin as directed. Please take Tussionex as needed. Dramatically increase water intake, strive for at least half your body weight in oz, so for you that would be 115 oz. If symptoms persist after antibiotic completed, then please call clinic. Follow-up with Dr. Raliegh Scarlet for "fast heart" or increased fatigue. EKG today was normal. FEEL BETTER!

## 2018-02-17 NOTE — Progress Notes (Signed)
thr  Subjective:    Patient ID: Ryan [name], male    DOB: 1965-10-09, 52 y.o.   MRN: [id number]  HPI:  Ryan [name] presents productive cough (yellow/white mucus), white/clear nasal drainage and extreme fatigue that started >2 weeks ago. He has been using OTC DayQuil and Nyquil and reports only drinking 15-20 oz water/day He reports helping a friend carry furniture (est wt) up flight of stairs and states "it took my a minute to really catch my breath". He denies tobacco use He has hx of cardiac disease CME walked him >2 minutes 02 sat remained >95%, however HR briefly jumped >200 He reports hx of tachycardia and full cardiac work-up in 2005? Pre pt is as all normal  Patient Care Team    Relationship Specialty Notifications Start End  Ryan Dance, DO PCP - General Family Medicine  12/04/16   Ryan [name], [place] Nurse Practitioner Nurse Practitioner  02/05/17     Patient Active Problem List   Diagnosis Date Noted  . Tachycardia 02/17/2018  . Bronchitis 02/17/2018  . Subclinical hypothyroidism 08/06/2017  . Elevated low density lipoprotein (LDL) cholesterol level 08/06/2017  . Obesity, Class I, BMI 30-34.9 08/06/2017  . Vitamin D insufficiency 03/05/2017  . S/P appendectomy 1985 or so 02/05/2017  . Family history of diverticulitis of colon 02/05/2017  . Family history of diabetes mellitus in maternal grandmother 02/05/2017  . Family history of heart disease in male family member before age 34 02/05/2017  . Environmental and seasonal allergies 02/05/2017  . Nephritis- 1972 03/19/1970     Past Medical History:  Diagnosis Date  . Nephritis 1972     Past Surgical History:  Procedure Laterality Date  . APPENDECTOMY  1984     Family History  Problem Relation Age of Onset  . Alcohol abuse Mother   . Heart attack Father   . Diabetes Maternal Grandfather      Social History   Substance and Sexual Activity  Drug Use No     Social History   Substance and Sexual  Activity  Alcohol Use Yes  . Alcohol/week: 1.0 standard drinks  . Types: 1 Standard drinks or equivalent per week     Social History   Tobacco Use  Smoking Status Never Smoker  Smokeless Tobacco Former User     Outpatient Encounter Medications as of 02/17/2018  Medication Sig  . Vitamin D, Ergocalciferol, (DRISDOL) 50000 units CAPS capsule Take 1 capsule (50,000 Units total) by mouth every 7 (seven) days.  Marland Kitchen azithromycin (ZITHROMAX) 250 MG tablet 2 tabs day one. 1 tabs days two-five  . chlorpheniramine-HYDROcodone (TUSSIONEX) 10-8 MG/5ML SUER Take 5 mLs by mouth every 12 (twelve) hours as needed.   No facility-administered encounter medications on file as of 02/17/2018.     Allergies: Patient has no allergy information on record.  Body mass index is 31.75 kg/m.  Blood pressure (!) 93/59, pulse 100, temperature 98.9 F (37.2 C), temperature source Oral, height 6' (1.829 m), weight 234 lb 1.6 oz (106.2 kg), SpO2 95 %.  Review of Systems  Constitutional: Positive for activity change, appetite change and fatigue. Negative for chills, diaphoresis, fever and unexpected weight change.  HENT: Positive for congestion, postnasal drip, sinus pressure, sore throat and voice change. Negative for sinus pain.   Eyes: Negative for visual disturbance.  Respiratory: Positive for cough and shortness of breath. Negative for chest tightness, wheezing and stridor.   Cardiovascular: Negative for chest pain, palpitations and leg swelling.  Gastrointestinal: Negative for abdominal  distention, anal bleeding, blood in stool, constipation, diarrhea, nausea and vomiting.  Neurological: Negative for dizziness and headaches.  Psychiatric/Behavioral: Positive for sleep disturbance.       Objective:   Physical Exam  Constitutional: He is oriented to person, place, and time. He appears well-developed and well-nourished. He appears ill. No distress.  HENT:  Head: Normocephalic and atraumatic.  Right  Ear: External ear normal. Tympanic membrane is bulging. Tympanic membrane is not erythematous. No decreased hearing is noted.  Left Ear: External ear normal. Tympanic membrane is bulging. Tympanic membrane is not erythematous.  Nose: Mucosal edema and rhinorrhea present. Right sinus exhibits no maxillary sinus tenderness and no frontal sinus tenderness. Left sinus exhibits no maxillary sinus tenderness and no frontal sinus tenderness.  Mouth/Throat: Uvula is midline and mucous membranes are normal. Posterior oropharyngeal edema and posterior oropharyngeal erythema present. No oropharyngeal exudate or tonsillar abscesses. Tonsils are 0 on the right. Tonsils are 0 on the left. No tonsillar exudate.  Eyes: Pupils are equal, round, and reactive to light. Conjunctivae and EOM are normal.  Neck: Normal range of motion. Neck supple.  Cardiovascular: Normal rate, regular rhythm, normal heart sounds and intact distal pulses.  No murmur heard. Pulmonary/Chest: Effort normal and breath sounds normal. No stridor. No respiratory distress. He has no wheezes. He has no rales. He exhibits no tenderness.  Lymphadenopathy:    He has no cervical adenopathy.  Neurological: He is alert and oriented to person, place, and time.  Skin: Skin is warm and dry. Capillary refill takes less than 2 seconds. No rash noted. He is not diaphoretic. No erythema. No pallor.  Psychiatric: He has a normal mood and affect. His behavior is normal. Judgment and thought content normal.  Nursing note and vitals reviewed.     Assessment & Plan:   1. Tachycardia   2. Dyspnea, unspecified type   3. Bronchitis     Tachycardia HR 100 at rest HR when walking in clinic as high as 208 EKG- NSR If palpitations/fatigue continue, f/u with Ryan [name]   Bronchitis [facility] Controlled Substance Database reviewed- no aberrancies noted Please take Azithromycin as directed. Please take Tussionex as needed. Dramatically increase water  intake, strive for at least half your body weight in oz, so for you that would be 115 oz. If symptoms persist after antibiotic completed, then please call clinic.   Pt was in the office today for 25+ minutes,I spent >75% of time  in face to face counseling of patient's various medical conditions and in coordination of care  FOLLOW-UP:  Return if symptoms worsen or fail to improve.

## 2018-02-17 NOTE — Assessment & Plan Note (Signed)
[place] Controlled Substance Database reviewed- no aberrancies noted Please take Azithromycin as directed. Please take Tussionex as needed. Dramatically increase water intake, strive for at least half your body weight in oz, so for you that would be 115 oz. If symptoms persist after antibiotic completed, then please call clinic.

## 2018-02-19 ENCOUNTER — Encounter: Payer: Self-pay | Admitting: Family Medicine

## 2018-02-19 ENCOUNTER — Ambulatory Visit: Payer: BC Managed Care – PPO | Admitting: Family Medicine

## 2018-02-19 ENCOUNTER — Encounter: Payer: BC Managed Care – PPO | Admitting: Family Medicine

## 2018-02-19 VITALS — BP 107/70 | HR 79 | Temp 98.3°F | Ht 72.0 in | Wt 234.0 lb

## 2018-02-19 DIAGNOSIS — B309 Viral conjunctivitis, unspecified: Secondary | ICD-10-CM

## 2018-02-19 MED ORDER — OLOPATADINE HCL 0.1 % OP SOLN: 1.0000 [drp] | Freq: Two times a day (BID) | OPHTHALMIC | 11 refills | Status: DC

## 2018-02-19 NOTE — Progress Notes (Signed)
Acute Care Office visit  Assessment and plan:  1. Viral conjunctivitis- B/L     1. Viral Conjunctivitis - Viral vs Allergic vs Bacterial causes for pt's symptoms reveiwed.     - Supportive care (warm fluids, adequate rest) and various OTC medications discussed in addition to any prescribed.  - Continue antibiotics as prescribed by Mina Marble for bronchitis.  - Eye drops prescribed today.  See med list.  - Call or RTC if new symptoms, or if no improvement or worse over next several days.     Meds ordered this encounter  Medications  . olopatadine (PATANOL) 0.1 % ophthalmic solution    Sig: Place 1 drop into both eyes 2 (two) times daily.    Dispense:  5 mL    Refill:  11     Gross side effects, risk and benefits, and alternatives of medications discussed with patient.  Patient is aware that all medications have potential side effects and we are unable to predict every sideeffect or drug-drug interaction that may occur.  Expresses verbal understanding and consents to current therapy plan and treatment regiment.   Education and routine counseling performed. Handouts provided.  Anticipatory guidance and routine counseling done re: condition, txmnt options and need for follow up. All questions of patient's were answered.  Return if symptoms worsen or fail to improve, for Follow-up for chronic care as previously discussed.  Please see AVS handed out to patient at the end of our visit for additional patient instructions/ counseling done pertaining to today's office visit.  Note:  This document was partially repared using Dragon voice recognition software and may include unintentional dictation errors.  This document serves as a record of services personally performed by [name], DO. It was created on her behalf by [name], a trained medical scribe. The creation of this record is based on the scribe's personal observations and the provider's statements  to them.   I have reviewed the above medical documentation for accuracy and completeness and I concur.  [name], DO       Subjective:    Chief Complaint  Patient presents with  . Eye Problem    Was here on Monday morning for bronchitis; saw NP [facility].  Has been sick all in all for 3 weeks.  Was placed on antibiotics.  Is on day three of antibiotics, Z-pack.  HPI:  Pt presents with current pinkeye symptoms for 2 days.  C/o:  Pinkeye, conjunctivitis.  Notes his grandson came to visit.    Grandson had pinkeye, "I got it, whole family's got it."  Notes some pain in his eyes early yesterday.  Has had pain in both eyes.     For symptoms patient has tried:  Z-pack as prescribed on Monday (2 days ago).  Overall getting:  On antibiotic treatment for bronchitis with new conjunctivitis symptoms.   Patient Care Team    Relationship Specialty Notifications Start End  [name], DO PCP - General Family Medicine  12/04/16   Vicenta Aly, [place] Nurse Practitioner Nurse Practitioner  02/05/17     Past medical history, Surgical history, Family history reviewed and noted below, Social history, Allergies, and Medications have been entered into the medical record, reviewed and changed as needed.   No Known Allergies  Review of Systems: - see above HPI for pertinent positives General:   No F/C, wt loss Pulm:   No DIB, pleuritic chest pain Card:  No CP, palpitations Abd:  No n/v/d or pain Ext:  No inc edema from baseline   Objective:   Blood pressure 107/70, pulse 79, temperature 98.3 F (36.8 C), height 6' (1.829 m), weight 234 lb (106.1 kg), SpO2 99 %. Body mass index is 31.74 kg/m. General: Well Developed, well nourished, appropriate for stated age.  Neuro: Alert and oriented x3, extra-ocular muscles intact, sensation grossly intact.  HEENT: Normocephalic, atraumatic, injected conjunctiva bilaterally, and diffuse erythema throughout.  Pupils EOMI, PERRLA,  with no exudate, neck supple, no masses, no painful lymphadenopathy, TM's intact B/L, no acute findings. Nares- patent, clear d/c, OP- clear, mild erythema, No TTP sinuses Skin: Warm and dry, no gross rash. Vascular:  No gross lower ext edema, cap RF less 2 sec. Psych: judgement and insight good, Euthymic mood. Full Affect.

## 2018-02-19 NOTE — Patient Instructions (Signed)
You most likely have a viral infection that should resolve on its own over time  Symptoms for a viral upper respiratory tract infection usually last 3-7 days but can stretch out to 2-3 weeks before you're feeling back to normal-especially if not taking care of yourself.  Your symptoms should not worsen after 7-10 days and if they truely do, please notify our office, as you may need antibiotics. (Which you are already on the Z-Pak.)   You can use over-the-counter afrin nasal spray for up to 3 days (NO longer than that) which will help acutely with nasal drainage/ congestion short term.   Also, sterile saline nasal rinses, such as Milta Deiters med or AYR sinus rinses, can be very helpful and should be done twice daily- especially throughout the allergy season.   Remember you should use distilled water or previously boiled water to do this.   You can also use an over the counter cold and flu medication such as Tylenol Severe Cold and Sinus/Flu or Dayquil, Nyquil and the like, which will help with cough, congestion, headache/ pain, fevers/chills etc.  Please note, if you being treated for hypertension or have high blood pressure, you should be using the cold meds designated "HBP".    Unfortunately, antibiotics are not helpful for viral infections.  Wash your hands frequently, as you did not want to get those around you sick as well. Never sneeze or cough on others.  And you should not be going to school or work if you are running a temperature of 100.5 or more on two separate occasions.   Drink plenty of fluids and stay hydrated, especially if you are running fevers.  We don't know why, but chicken soup also helps, try it! :)

## 2018-04-07 ENCOUNTER — Encounter: Payer: BC Managed Care – PPO | Admitting: Family Medicine

## 2018-05-01 ENCOUNTER — Encounter: Payer: Self-pay | Admitting: Family Medicine

## 2018-05-01 ENCOUNTER — Encounter: Payer: Self-pay | Admitting: Gastroenterology

## 2018-05-01 ENCOUNTER — Ambulatory Visit (INDEPENDENT_AMBULATORY_CARE_PROVIDER_SITE_OTHER): Payer: BC Managed Care – PPO | Admitting: Family Medicine

## 2018-05-01 VITALS — BP 113/78 | HR 82 | Temp 98.4°F | Ht 72.0 in | Wt 238.7 lb

## 2018-05-01 DIAGNOSIS — Z1211 Encounter for screening for malignant neoplasm of colon: Secondary | ICD-10-CM | POA: Diagnosis not present

## 2018-05-01 DIAGNOSIS — Z719 Counseling, unspecified: Secondary | ICD-10-CM

## 2018-05-01 DIAGNOSIS — Z0000 Encounter for general adult medical examination without abnormal findings: Secondary | ICD-10-CM

## 2018-05-01 DIAGNOSIS — Z23 Encounter for immunization: Secondary | ICD-10-CM | POA: Diagnosis not present

## 2018-05-01 LAB — POC HEMOCCULT BLD/STL (OFFICE/1-CARD/DIAGNOSTIC): Fecal Occult Blood, POC: NEGATIVE

## 2018-05-01 MED ORDER — ZOSTER VAC RECOMB ADJUVANTED 50 MCG/0.5ML IM SUSR: 0.5000 mL | Freq: Once | INTRAMUSCULAR | 0 refills | Status: AC

## 2018-05-01 NOTE — Progress Notes (Signed)
Male physical  Impression and Recommendations:    1. Encounter for wellness examination   2. Health education/counseling   3. Need for shingles vaccine   4. Colon cancer screening     1) Anticipatory Guidance: Discussed importance of wearing a seatbelt while driving, not texting while driving;   sunscreen when outside along with skin surveillance; eating a balanced and modest diet; physical activity at least 25 minutes per day or 150 min/ week moderate to intense activity.  - Advised patient in prudent self-testicular exam practice during appointment today.  Discussed symptoms to look out for.  2) Immunizations / Screenings / Labs:  All immunizations are up-to-date per recommendations or will be updated today. Patient is due for dental and vision screens which pt will schedule independently. Will obtain CBC, CMP, HgA1c, Lipid panel, TSH and vit D when fasting, if not already done recently.   - Need for shingles vaccine.  Information provided today.  - Obtained flu shot at work.  - Need for colonoscopy.  Order placed today.  3) Weight:   BMI meaning discussed with patient.  Discussed goal of losing 5-10% of current body weight which would improve overall feelings of well being and improve objective health data. Improve nutrient density of diet through increasing intake of fruits and vegetables and decreasing saturated fats, white flour products and refined sugars.    Orders Placed This Encounter  Procedures  . Varicella-zoster vaccine IM (Shingrix)  . Ambulatory referral to Gastroenterology    Referral Priority:   Routine    Referral Type:   Consultation    Referral Reason:   Specialty Services Required    Number of Visits Requested:   1     Gross side effects, risk and benefits, and alternatives of medications discussed with patient.  Patient is aware that all medications have potential side effects and we are unable to predict every side effect or drug-drug  interaction that may occur.  Expresses verbal understanding and consents to current therapy plan and treatment regimen.  Please see AVS handed out to patient at the end of our visit for further patient instructions/ counseling done pertaining to today's office visit.  Follow-up preventative CPE in 1 year. Follow-up office visit pending lab work.  F/up sooner for chronic care management and/or prn  This document serves as a record of services personally performed by Ryan Dance, DO. It was created on her behalf by [name], a trained medical scribe. The creation of this record is based on the scribe's personal observations and the provider's statements to them.   I have reviewed the above medical documentation for accuracy and completeness and I concur.  Ryan Dance, DO 05/01/2018 3:05 PM        Subjective:    CC: CPE  HPI: Ryan [name] is a 53 y.o. male who presents to [name] today for a yearly health maintenance exam.     Health Maintenance Summary Reviewed and updated, unless pt declines services.  Colonoscopy:  Patient has need for colonoscopy in near future. Denies family history of colon cancer. Tobacco History Reviewed:  Never smoker. Alcohol:  No concerns, no excessive use. Exercise Habits:  States he's been doing some weight lifting and a little bit of jogging. STD concerns:  None. Drug Use:  None. Birth control method:  Denies concerns. Testicular/penile concerns:  Denies concerns. Denies family history of prostate cancer.  Overall feels well.  Denies issues with stress  or sleeping.  Notes that he does not have his appendix.  He had his appendix out at age 14.  Denies issues with stream, urinating frequently, or urinating several times per night.  If he waits too long to urinate, notes that it's sometimes difficult to start or control his urinary stream.  Notes if he urinates after waiting a long time, the stream  goes, then stops, then goes, and "it only happens if I wait too long."  Denies constipation.  Dermatological Health [name] to see a dermatologist through [name] 3 months ago.  Visual Health Denies concerns with his vision.  Hearing Health Denies hearing concerns.    Immunization History  Administered Date(s) Administered  . Hepatitis B, ped/adol 05/14/2012, 06/18/2012  . Tdap 03/20/2009    Health Maintenance  Topic Date Due  . INFLUENZA VACCINE  12/18/2018 (Originally 10/17/2017)  . COLONOSCOPY  02/20/2019 (Originally 11/26/2013)  . Hepatitis C Screening  02/20/2019 (Originally [birth year])  . HIV Screening  02/20/2019 (Originally 11/27/1978)  . TETANUS/TDAP  03/21/2019      Wt Readings from Last 3 Encounters:  05/01/18 238 lb 11.2 oz (108.3 kg)  02/19/18 234 lb (106.1 kg)  02/17/18 234 lb 1.6 oz (106.2 kg)   BP Readings from Last 3 Encounters:  05/01/18 113/78  02/19/18 107/70  02/17/18 (!) 93/59   Pulse Readings from Last 3 Encounters:  05/01/18 82  02/19/18 79  02/17/18 100    Patient Active Problem List   Diagnosis Date Noted  . Tachycardia 02/17/2018  . Bronchitis 02/17/2018  . Subclinical hypothyroidism 08/06/2017  . Elevated low density lipoprotein (LDL) cholesterol level 08/06/2017  . Obesity, Class I, BMI 30-34.9 08/06/2017  . Vitamin D insufficiency 03/05/2017  . S/P appendectomy 1985 or so 02/05/2017  . Family history of diverticulitis of colon 02/05/2017  . Family history of diabetes mellitus in maternal grandmother 02/05/2017  . Family history of heart disease in male family member before age 54 02/05/2017  . Environmental and seasonal allergies 02/05/2017  . Nephritis- 1972 03/19/1970    Past Medical History:  Diagnosis Date  . Nephritis 1972    Past Surgical History:  Procedure Laterality Date  . APPENDECTOMY  1984    Family History  Problem Relation Age of Onset  . Alcohol abuse Mother   . Heart attack Father   .  Diabetes Maternal Grandfather     Social History   Substance and Sexual Activity  Drug Use No  ,  Social History   Substance and Sexual Activity  Alcohol Use Yes  . Alcohol/week: 1.0 standard drinks  . Types: 1 Standard drinks or equivalent per week  ,  Social History   Tobacco Use  Smoking Status Never Smoker  Smokeless Tobacco Former Systems developer  ,  Social History   Substance and Sexual Activity  Sexual Activity Yes  . Birth control/protection: None    Patient's Medications  New Prescriptions   No medications on file  Previous Medications   VITAMIN D, ERGOCALCIFEROL, (DRISDOL) 50000 UNITS CAPS CAPSULE    Take 1 capsule (50,000 Units total) by mouth every 7 (seven) days.  Modified Medications   No medications on file  Discontinued Medications   AZITHROMYCIN (ZITHROMAX) 250 MG TABLET    2 tabs day one. 1 tabs days two-five   CHLORPHENIRAMINE-HYDROCODONE (TUSSIONEX) 10-8 MG/5ML SUER    Take 5 mLs by mouth every 12 (twelve) hours as needed.   OLOPATADINE (PATANOL) 0.1 % OPHTHALMIC SOLUTION    Place 1 drop into  both eyes 2 (two) times daily.    Patient has no known allergies.  Review of Systems: General:   Denies fever, chills, unexplained weight loss.  Optho/Auditory:   Denies visual changes, blurred vision/LOV Respiratory:   Denies SOB, DOE more than baseline levels.  Cardiovascular:   Denies chest pain, palpitations, new onset peripheral edema  Gastrointestinal:   Denies nausea, vomiting, diarrhea.  Genitourinary: Denies dysuria, freq/ urgency, flank pain or discharge from genitals.  Endocrine:     Denies hot or cold intolerance, polyuria, polydipsia. Musculoskeletal:   Denies unexplained myalgias, joint swelling, unexplained arthralgias, gait problems.  Skin:  Denies rash, suspicious lesions Neurological:     Denies dizziness, unexplained weakness, numbness  Psychiatric/Behavioral:   Denies mood changes, suicidal or homicidal ideations,  hallucinations    Objective:     Blood pressure 113/78, pulse 82, temperature 98.4 F (36.9 C), height 6' (1.829 m), weight 238 lb 11.2 oz (108.3 kg), SpO2 98 %. Body mass index is 32.37 kg/m. General Appearance:    Alert, cooperative, no distress, appears stated age  Head:    Normocephalic, without obvious abnormality, atraumatic  Eyes:    PERRL, conjunctiva/corneas clear, EOM's intact, fundi    benign, both eyes  Ears:    Normal TM's and external ear canals, both ears  Nose:   Nares normal, septum midline, mucosa normal, no drainage    or sinus tenderness  Throat:   Lips w/o lesion, mucosa moist, and tongue normal; teeth and   gums normal  Neck:   Supple, symmetrical, trachea midline, no adenopathy;    thyroid:  no enlargement/tenderness/nodules; no carotid   bruit or JVD  Back:     Symmetric, no curvature, ROM normal, no CVA tenderness  Lungs:     Clear to auscultation bilaterally, respirations unlabored, no       Wh/ R/ R  Chest Wall:    No tenderness or gross deformity; normal excursion   Heart:    Regular rate and rhythm, S1 and S2 normal, no murmur, rub   or gallop  Abdomen:     Soft, non-tender, bowel sounds active all four quadrants, NO   G/R/R, no masses, no organomegaly  Genitalia:   Ext genitalia: without lesion, no penile rash or discharge, no hernias appreciated   Rectal:    Normal tone, prostate with slight enlargement bilaterally, but smooth and equal b/l, no tenderness; guaiac negative stool  Extremities:   Extremities normal, atraumatic, no cyanosis or gross edema  Pulses:   2+ and symmetric all extremities  Skin:   Warm, dry, Skin color, texture, turgor normal, no obvious rashes or lesions  M-Sk:   Ambulates * 4 w/o difficulty, no gross deformities, tone WNL  Neurologic:   CNII-XII intact, normal strength, sensation and reflexes    Throughout Psych:  No HI/SI, judgement and insight good, Euthymic mood. Full Affect.

## 2018-05-01 NOTE — Patient Instructions (Addendum)
Below was our recommendations after your blood work in this past November.  Please make sure you do have follow-up appointments to follow-up on these issues:  Still with subclinical hypothyroidism, no need for change in txmnt plan  Bad chol is worse! Not good. Rec low sat and trans fat diet and repeat 66mo if NI- we need to discuss possibility of going on meds  Pt's Crt is elevated. This is not good. Control Bp, BS, avoid nephrotoxic meds and pt needs adequate hydration and exercise. Repeat serum crt 475mo- Please place future orders for FLP, crt and vit d in [id number] OV with me after    Preventive Care for Adults, Male   A healthy lifestyle and preventive care can promote health and wellness. Preventive health guidelines for men include the following key practices:  A routine yearly physical is a good way to check with your health care provider about your health and preventative screening. It is a chance to share any concerns and updates on your health and to receive a thorough exam.  Visit your dentist for a routine exam and preventative care every 6 months. Brush your teeth twice a day and floss once a day. Good oral hygiene prevents tooth decay and gum disease.  The frequency of eye exams is based on your age, health, family medical history, use of contact lenses, and other factors. Follow your health care provider's recommendations for frequency of eye exams.  Eat a healthy diet. Foods such as vegetables, fruits, whole grains, low-fat dairy products, and lean protein foods contain the nutrients you need without too many calories. Decrease your intake of foods high in solid fats, added sugars, and salt. Eat the right amount of calories for you. Get information about a proper diet from your health care provider, if necessary.  Regular physical exercise is one of the most important things you can do for your health. Most adults should get at least 150 minutes of moderate-intensity  exercise (any activity that increases your heart rate and causes you to sweat) each week. In addition, most adults need muscle-strengthening exercises on 2 or more days a week.  Maintain a healthy weight. The body mass index (BMI) is a screening tool to identify possible weight problems. It provides an estimate of body fat based on height and weight. Your health care provider can find your BMI and can help you achieve or maintain a healthy weight. For adults 20 years and older:  A BMI below 18.5 is considered underweight.  A BMI of 18.5 to 24.9 is normal.  A BMI of 25 to 29.9 is considered overweight.  A BMI of 30 and above is considered obese.  Maintain normal blood lipids and cholesterol levels by exercising and minimizing your intake of saturated fat. Eat a balanced diet with plenty of fruit and vegetables. Blood tests for lipids and cholesterol should begin at age 44 46d be repeated every 5 years. If your lipid or cholesterol levels are high, you are over 50, or you are at high risk for heart disease, you may need your cholesterol levels checked more frequently. Ongoing high lipid and cholesterol levels should be treated with medicines if diet and exercise are not working.  If you smoke, find out from your health care provider how to quit. If you do not use tobacco, do not start.  Lung cancer screening is recommended for adults aged 55-65-80ars who are at high risk for developing lung cancer because of a history  of smoking. A yearly low-dose CT scan of the lungs is recommended for people who have at least a 30-pack-year history of smoking and are a current smoker or have quit within the past 15 years. A pack year of smoking is smoking an average of 1 pack of cigarettes a day for 1 year (for example: 1 pack a day for 30 years or 2 packs a day for 15 years). Yearly screening should continue until the smoker has stopped smoking for at least 15 years. Yearly screening should be stopped for people who  develop a health problem that would prevent them from having lung cancer treatment.  If you choose to drink alcohol, do not have more than 2 drinks per day. One drink is considered to be 12 ounces (355 mL) of beer, 5 ounces (148 mL) of wine, or 1.5 ounces (44 mL) of liquor.  Avoid use of street drugs. Do not share needles with anyone. Ask for help if you need support or instructions about stopping the use of drugs.  High blood pressure causes heart disease and increases the risk of stroke. Your blood pressure should be checked at least every 1-2 years. Ongoing high blood pressure should be treated with medicines, if weight loss and exercise are not effective.  If you are 33-63 years old, ask your health care provider if you should take aspirin to prevent heart disease.  Diabetes screening is done by taking a blood sample to check your blood glucose level after you have not eaten for a certain period of time (fasting). If you are not overweight and you do not have risk factors for diabetes, you should be screened once every 3 years starting at age 32. If you are overweight or obese and you are 66-65 years of age, you should be screened for diabetes every year as part of your cardiovascular risk assessment.  Colorectal cancer can be detected and often prevented. Most routine colorectal cancer screening begins at the age of 37 and continues through age 44. However, your health care provider may recommend screening at an earlier age if you have risk factors for colon cancer. On a yearly basis, your health care provider may provide home test kits to check for hidden blood in the stool. Use of a small camera at the end of a tube to directly examine the colon (sigmoidoscopy or colonoscopy) can detect the earliest forms of colorectal cancer. Talk to your health care provider about this at age 16, when routine screening begins. Direct exam of the colon should be repeated every 5-10 years through age 68, unless  early forms of precancerous polyps or small growths are found.  People who are at an increased risk for hepatitis B should be screened for this virus. You are considered at high risk for hepatitis B if:  You were born in a country where hepatitis B occurs often. Talk with your health care provider about which countries are considered high risk.  Your parents were born in a high-risk country and you have not received a shot to protect against hepatitis B (hepatitis B vaccine).  You have HIV or AIDS.  You use needles to inject street drugs.  You live with, or have sex with, someone who has hepatitis B.  You are a man who has sex with other men (MSM).  You get hemodialysis treatment.  You take certain medicines for conditions such as cancer, organ transplantation, and autoimmune conditions.  Hepatitis C blood testing is recommended for all people  born from [birth year] through 1965 and any individual with known risks for hepatitis C.  Practice safe sex. Use condoms and avoid high-risk sexual practices to reduce the spread of sexually transmitted infections (STIs). STIs include gonorrhea, chlamydia, syphilis, trichomonas, herpes, HPV, and human immunodeficiency virus (HIV). Herpes, HIV, and HPV are viral illnesses that have no cure. They can result in disability, cancer, and death.  If you are a man who has sex with other men, you should be screened at least once per year for:  HIV.  Urethral, rectal, and pharyngeal infection of gonorrhea, chlamydia, or both.  If you are at risk of being infected with HIV, it is recommended that you take a prescription medicine daily to prevent HIV infection. This is called preexposure prophylaxis (PrEP). You are considered at risk if:  You are a man who has sex with other men (MSM) and have other risk factors.  You are a heterosexual man, are sexually active, and are at increased risk for HIV infection.  You take drugs by injection.  You are sexually  active with a partner who has HIV.  Talk with your health care provider about whether you are at high risk of being infected with HIV. If you choose to begin PrEP, you should first be tested for HIV. You should then be tested every 3 months for as long as you are taking PrEP.  A one-time screening for abdominal aortic aneurysm (AAA) and surgical repair of large AAAs by ultrasound are recommended for men ages 73 to 78 years who are current or former smokers.  Healthy men should no longer receive prostate-specific antigen (PSA) blood tests as part of routine cancer screening. Talk with your health care provider about prostate cancer screening.  Testicular cancer screening is not recommended for adult males who have no symptoms. Screening includes self-exam, a health care provider exam, and other screening tests. Consult with your health care provider about any symptoms you have or any concerns you have about testicular cancer.  Use sunscreen. Apply sunscreen liberally and repeatedly throughout the day. You should seek shade when your shadow is shorter than you. Protect yourself by wearing long sleeves, pants, a wide-brimmed hat, and sunglasses year round, whenever you are outdoors.  Once a month, do a whole-body skin exam, using a mirror to look at the skin on your back. Tell your health care provider about new moles, moles that have irregular borders, moles that are larger than a pencil eraser, or moles that have changed in shape or color.  Stay current with required vaccines (immunizations).  Influenza vaccine. All adults should be immunized every year.  Tetanus, diphtheria, and acellular pertussis (Td, Tdap) vaccine. An adult who has not previously received Tdap or who does not know his vaccine status should receive 1 dose of Tdap. This initial dose should be followed by tetanus and diphtheria toxoids (Td) booster doses every 10 years. Adults with an unknown or incomplete history of completing a  3-dose immunization series with Td-containing vaccines should begin or complete a primary immunization series including a Tdap dose. Adults should receive a Td booster every 10 years.  Varicella vaccine. An adult without evidence of immunity to varicella should receive 2 doses or a second dose if he has previously received 1 dose.  Human papillomavirus (HPV) vaccine. Males aged 11-21 years who have not received the vaccine previously should receive the 3-dose series. Males aged 22-26 years may be immunized. Immunization is recommended through the age of 95 years for  any male who has sex with males and did not get any or all doses earlier. Immunization is recommended for any person with an immunocompromised condition through the age of 50 years if he did not get any or all doses earlier. During the 3-dose series, the second dose should be obtained 4-8 weeks after the first dose. The third dose should be obtained 24 weeks after the first dose and 16 weeks after the second dose.  Zoster vaccine. One dose is recommended for adults aged 47 years or older unless certain conditions are present.  Measles, mumps, and rubella (MMR) vaccine. Adults born before [birth year] generally are considered immune to measles and mumps. Adults born in [birth year] or later should have 1 or more doses of MMR vaccine unless there is a contraindication to the vaccine or there is laboratory evidence of immunity to each of the three diseases. A routine second dose of MMR vaccine should be obtained at least 28 days after the first dose for students attending postsecondary schools, health care workers, or international travelers. People who received inactivated measles vaccine or an unknown type of measles vaccine during 1963-1967 should receive 2 doses of MMR vaccine. People who received inactivated mumps vaccine or an unknown type of mumps vaccine before 1979 and are at high risk for mumps infection should consider immunization with 2 doses of MMR  vaccine. Unvaccinated health care workers born before [birth year] who lack laboratory evidence of measles, mumps, or rubella immunity or laboratory confirmation of disease should consider measles and mumps immunization with 2 doses of MMR vaccine or rubella immunization with 1 dose of MMR vaccine.  Pneumococcal 13-valent conjugate (PCV13) vaccine. When indicated, a person who is uncertain of his immunization history and has no record of immunization should receive the PCV13 vaccine. All adults 24 years of age and older should receive this vaccine. An adult aged 36 years or older who has certain medical conditions and has not been previously immunized should receive 1 dose of PCV13 vaccine. This PCV13 should be followed with a dose of pneumococcal polysaccharide (PPSV23) vaccine. Adults who are at high risk for pneumococcal disease should obtain the PPSV23 vaccine at least 8 weeks after the dose of PCV13 vaccine. Adults older than 55 years of age who have normal immune system function should obtain the PPSV23 vaccine dose at least 1 year after the dose of PCV13 vaccine.  Pneumococcal polysaccharide (PPSV23) vaccine. When PCV13 is also indicated, PCV13 should be obtained first. All adults aged 42 years and older should be immunized. An adult younger than age 66 years who has certain medical conditions should be immunized. Any person who resides in a nursing home or long-term care facility should be immunized. An adult smoker should be immunized. People with an immunocompromised condition and certain other conditions should receive both PCV13 and PPSV23 vaccines. People with human immunodeficiency virus (HIV) infection should be immunized as soon as possible after diagnosis. Immunization during chemotherapy or radiation therapy should be avoided. Routine use of PPSV23 vaccine is not recommended for American Indians, [place] Natives, or people younger than 65 years unless there are medical conditions that require PPSV23  vaccine. When indicated, people who have unknown immunization and have no record of immunization should receive PPSV23 vaccine. One-time revaccination 5 years after the first dose of PPSV23 is recommended for people aged 19-64 years who have chronic kidney failure, nephrotic syndrome, asplenia, or immunocompromised conditions. People who received 1-2 doses of PPSV23 before age 38 years should receive another dose  of PPSV23 vaccine at age 34 years or later if at least 5 years have passed since the previous dose. Doses of PPSV23 are not needed for people immunized with PPSV23 at or after age 83 years.  Meningococcal vaccine. Adults with asplenia or persistent complement component deficiencies should receive 2 doses of quadrivalent meningococcal conjugate (MenACWY-D) vaccine. The doses should be obtained at least 2 months apart. Microbiologists working with certain meningococcal bacteria, [employer] recruits, people at risk during an outbreak, and people who travel to or live in countries with a high rate of meningitis should be immunized. A first-year college student up through age 33 years who is living in a residence hall should receive a dose if he did not receive a dose on or after his 16th birthday. Adults who have certain high-risk conditions should receive one or more doses of vaccine.  Hepatitis A vaccine. Adults who wish to be protected from this disease, have chronic liver disease, work with hepatitis A-infected animals, work in hepatitis A research labs, or travel to or work in countries with a high rate of hepatitis A should be immunized. Adults who were previously unvaccinated and who anticipate close contact with an international adoptee during the first 60 days after arrival in the [place] from a country with a high rate of hepatitis A should be immunized.  Hepatitis B vaccine. Adults should be immunized if they wish to be protected from this disease, are under age 74 years and have diabetes,  have chronic liver disease, have had more than one sex partner in the past 6 months, may be exposed to blood or other infectious body fluids, are household contacts or sex partners of hepatitis B positive people, are clients or workers in certain care facilities, or travel to or work in countries with a high rate of hepatitis B.  Haemophilus influenzae type b (Hib) vaccine. A previously unvaccinated person with asplenia or sickle cell disease or having a scheduled splenectomy should receive 1 dose of Hib vaccine. Regardless of previous immunization, a recipient of a hematopoietic stem cell transplant should receive a 3-dose series 6-12 months after his successful transplant. Hib vaccine is not recommended for adults with HIV infection. Preventive Service / Frequency Ages 74 to 77  Blood pressure check.** / Every 3-5 years.  Lipid and cholesterol check.** / Every 5 years beginning at age 67.  Hepatitis C blood test.** / For any individual with known risks for hepatitis C.  Skin self-exam. / Monthly.  Influenza vaccine. / Every year.  Tetanus, diphtheria, and acellular pertussis (Tdap, Td) vaccine.** / Consult your health care provider. 1 dose of Td every 10 years.  Varicella vaccine.** / Consult your health care provider.  HPV vaccine. / 3 doses over 6 months, if 78 or younger.  Measles, mumps, rubella (MMR) vaccine.** / You need at least 1 dose of MMR if you were born in 1957 or later. You may also need a second dose.  Pneumococcal 13-valent conjugate (PCV13) vaccine.** / Consult your health care provider.  Pneumococcal polysaccharide (PPSV23) vaccine.** / 1 to 2 doses if you smoke cigarettes or if you have certain conditions.  Meningococcal vaccine.** / 1 dose if you are age 2 to 90 years and a [employer] researcher living in a residence hall, or have one of several medical conditions. You may also need additional booster doses.  Hepatitis A vaccine.** / Consult your health care  provider.  Hepatitis B vaccine.** / Consult your health care provider.  Haemophilus influenzae  type b (Hib) vaccine.** / Consult your health care provider. Ages 66 to 14  Blood pressure check.** / Every year.  Lipid and cholesterol check.** / Every 5 years beginning at age 3.  Lung cancer screening. / Every year if you are aged 52-80 years and have a 30-pack-year history of smoking and currently smoke or have quit within the past 15 years. Yearly screening is stopped once you have quit smoking for at least 15 years or develop a health problem that would prevent you from having lung cancer treatment.  Fecal occult blood test (FOBT) of stool. / Every year beginning at age 78 and continuing until age 78. You may not have to do this test if you get a colonoscopy every 10 years.  Flexible sigmoidoscopy** or colonoscopy.** / Every 5 years for a flexible sigmoidoscopy or every 10 years for a colonoscopy beginning at age 37 and continuing until age 63.  Hepatitis C blood test.** / For all people born from [birth year] through 1965 and any individual with known risks for hepatitis C.  Skin self-exam. / Monthly.  Influenza vaccine. / Every year.  Tetanus, diphtheria, and acellular pertussis (Tdap/Td) vaccine.** / Consult your health care provider. 1 dose of Td every 10 years.  Varicella vaccine.** / Consult your health care provider.  Zoster vaccine.** / 1 dose for adults aged 64 years or older.  Measles, mumps, rubella (MMR) vaccine.** / You need at least 1 dose of MMR if you were born in 1957 or later. You may also need a second dose.  Pneumococcal 13-valent conjugate (PCV13) vaccine.** / Consult your health care provider.  Pneumococcal polysaccharide (PPSV23) vaccine.** / 1 to 2 doses if you smoke cigarettes or if you have certain conditions.  Meningococcal vaccine.** / Consult your health care provider.  Hepatitis A vaccine.** / Consult your health care provider.  Hepatitis B vaccine.** /  Consult your health care provider.  Haemophilus influenzae type b (Hib) vaccine.** / Consult your health care provider. Ages 69 and over  Blood pressure check.** / Every year.  Lipid and cholesterol check.**/ Every 5 years beginning at age 75.  Lung cancer screening. / Every year if you are aged 77-80 years and have a 30-pack-year history of smoking and currently smoke or have quit within the past 15 years. Yearly screening is stopped once you have quit smoking for at least 15 years or develop a health problem that would prevent you from having lung cancer treatment.  Fecal occult blood test (FOBT) of stool. / Every year beginning at age 22 and continuing until age 62. You may not have to do this test if you get a colonoscopy every 10 years.  Flexible sigmoidoscopy** or colonoscopy.** / Every 5 years for a flexible sigmoidoscopy or every 10 years for a colonoscopy beginning at age 57 and continuing until age 47.  Hepatitis C blood test.** / For all people born from [birth year] through 1965 and any individual with known risks for hepatitis C.  Abdominal aortic aneurysm (AAA) screening.** / A one-time screening for ages 47 to 110 years who are current or former smokers.  Skin self-exam. / Monthly.  Influenza vaccine. / Every year.  Tetanus, diphtheria, and acellular pertussis (Tdap/Td) vaccine.** / 1 dose of Td every 10 years.  Varicella vaccine.** / Consult your health care provider.  Zoster vaccine.** / 1 dose for adults aged 5 years or older.  Pneumococcal 13-valent conjugate (PCV13) vaccine.** / 1 dose for all adults aged 47 years and older.  Pneumococcal  polysaccharide (PPSV23) vaccine.** / 1 dose for all adults aged 51 years and older.  Meningococcal vaccine.** / Consult your health care provider.  Hepatitis A vaccine.** / Consult your health care provider.  Hepatitis B vaccine.** / Consult your health care provider.  Haemophilus influenzae type b (Hib) vaccine.** / Consult your  health care provider. **Family history and personal history of risk and conditions may change your health care provider's recommendations.   This information is not intended to replace advice given to you by your health care provider. Make sure you discuss any questions you have with your health care provider.   Document Released: 05/01/2001 Document Revised: 03/26/2014 Document Reviewed: 07/31/2010 Elsevier Interactive Patient Education [employer].

## 2018-05-08 ENCOUNTER — Other Ambulatory Visit: Payer: Self-pay | Admitting: Family Medicine

## 2018-05-08 DIAGNOSIS — E559 Vitamin D deficiency, unspecified: Secondary | ICD-10-CM

## 2018-05-27 ENCOUNTER — Encounter: Payer: Self-pay | Admitting: Gastroenterology

## 2018-05-27 ENCOUNTER — Ambulatory Visit (AMBULATORY_SURGERY_CENTER): Payer: Self-pay | Admitting: *Deleted

## 2018-05-27 ENCOUNTER — Other Ambulatory Visit: Payer: Self-pay

## 2018-05-27 VITALS — Ht 72.0 in | Wt 237.0 lb

## 2018-05-27 DIAGNOSIS — Z1211 Encounter for screening for malignant neoplasm of colon: Secondary | ICD-10-CM

## 2018-05-27 MED ORDER — PEG 3350-KCL-NA BICARB-NACL 420 G PO SOLR: 4000.0000 mL | Freq: Once | ORAL | 0 refills | Status: AC

## 2018-05-27 NOTE — Progress Notes (Signed)
No egg or soy allergy known to patient  No issues with past sedation with any surgeries  or procedures, no past  intubation  No diet pills per patient No home 02 use per patient  No blood thinners per patient  Pt denies issues with constipation  No A fib or A flutter  EMMI video sent to pt's e mail

## 2018-06-10 ENCOUNTER — Encounter: Payer: BC Managed Care – PPO | Admitting: Gastroenterology

## 2018-06-13 ENCOUNTER — Other Ambulatory Visit: Payer: BC Managed Care – PPO

## 2018-06-25 ENCOUNTER — Other Ambulatory Visit: Payer: BC Managed Care – PPO

## 2018-07-01 ENCOUNTER — Encounter: Payer: BC Managed Care – PPO | Admitting: Gastroenterology

## 2018-07-01 ENCOUNTER — Ambulatory Visit: Payer: BC Managed Care – PPO | Admitting: Family Medicine

## 2018-07-22 ENCOUNTER — Telehealth: Payer: Self-pay | Admitting: *Deleted

## 2018-07-22 NOTE — Telephone Encounter (Signed)
Ok to schedule colonoscopy at this time per Dr. Rush Landmark.  Spoke with pt and he states he prefers to wait at this time.  He would rather wait until July to schedule.  July's schedule is not available yet; recall put in so this isn't missed.  Recall [phone].

## 2018-08-19 ENCOUNTER — Encounter: Payer: Self-pay | Admitting: Gastroenterology

## 2018-11-17 ENCOUNTER — Ambulatory Visit
Admission: EM | Admit: 2018-11-17 | Discharge: 2018-11-17 | Disposition: A | Discharge status: Home / Self Care | Payer: BC Managed Care – PPO | Attending: Physician Assistant | Admitting: Physician Assistant

## 2018-11-17 ENCOUNTER — Other Ambulatory Visit: Payer: Self-pay

## 2018-11-17 DIAGNOSIS — M546 Pain in thoracic spine: Secondary | ICD-10-CM | POA: Diagnosis not present

## 2018-11-17 MED ORDER — METHOCARBAMOL 500 MG PO TABS: 500.0000 mg | ORAL_TABLET | Freq: Two times a day (BID) | ORAL | 0 refills | Status: DC

## 2018-11-17 NOTE — Discharge Instructions (Signed)
Start ibuprofen 800mg  three times a day for 5-10 days as directed. Robaxin as needed, this can make you drowsy, so do not take if you are going to drive, operate heavy machinery, or make important decisions. Ice/heat compresses as needed. This can take up to 3-4 weeks to completely resolve, but you should be feeling better each week. Follow up with PCP if symptoms worsen, changes for reevaluation. Follow up at the ED if losing grip strength

## 2018-11-17 NOTE — ED Provider Notes (Signed)
[facility]    CSN: [id number] Arrival date & time: 11/17/18  1626      History   Chief Complaint Chief Complaint  Patient presents with  . neck pain    HPI Ryan [name] is a 53 y.o. male.   53 year old male comes in for 2-day history of back/neck pain.  Denies injury/trauma.  States he woke up in the morning with the pain.  This happens intermittently, feels as if he slept wrong.  Usually, this resolves within a day, but this time has continued. He now has pain radiating down his arm.  He denies swelling of the joint, erythema, warmth.  Denies loss of grip strength.  Has tried ibuprofen 600 mg twice a day with some relief.  Oxycodone 5 mg yesterday with some relief.     Past Medical History:  Diagnosis Date  . Allergy    mild  . Asthma    athletic induced asthma age 57   . Bronchitis    with viral infection nov/Dec 2019  . Deviated septum   . Nephritis 1972    Patient Active Problem List   Diagnosis Date Noted  . Tachycardia 02/17/2018  . Bronchitis 02/17/2018  . Subclinical hypothyroidism 08/06/2017  . Elevated low density lipoprotein (LDL) cholesterol level 08/06/2017  . Obesity, Class I, BMI 30-34.9 08/06/2017  . Vitamin D insufficiency 03/05/2017  . S/P appendectomy 1985 or so 02/05/2017  . Family history of diverticulitis of colon 02/05/2017  . Family history of diabetes mellitus in maternal grandmother 02/05/2017  . Family history of heart disease in male family member before age 31 02/05/2017  . Environmental and seasonal allergies 02/05/2017  . Nephritis- 1972 03/19/1970    Past Surgical History:  Procedure Laterality Date  . APPENDECTOMY  1984       Home Medications    Prior to Admission medications   Medication Sig Start Date End Date Taking? Authorizing Provider  methocarbamol (ROBAXIN) 500 MG tablet Take 1 tablet (500 mg total) by mouth 2 (two) times daily. 11/17/18   Tasia Catchings, Seibert Keeter V, PA-C  Vitamin D, Ergocalciferol, (DRISDOL) 1.25  MG (50000 UT) CAPS capsule TAKE 1 CAPSULE (50,000 UNITS TOTAL) BY MOUTH EVERY 7 (SEVEN) DAYS. 05/08/18   [name], DO    Family History Family History  Problem Relation Age of Onset  . Alcohol abuse Mother   . Heart attack Father   . Diabetes Maternal Grandfather   . Pancreatic cancer Maternal Aunt   . Colon cancer Neg Hx   . Colon polyps Neg Hx   . Esophageal cancer Neg Hx   . Rectal cancer Neg Hx   . Stomach cancer Neg Hx     Social History Social History   Tobacco Use  . Smoking status: Never Smoker  . Smokeless tobacco: Never Used  Substance Use Topics  . Alcohol use: Yes    Alcohol/week: 1.0 standard drinks    Types: 1 Standard drinks or equivalent per week    Comment: social  . Drug use: No     Allergies   Patient has no known allergies.   Review of Systems Review of Systems  Reason unable to perform ROS: See HPI as above.     Physical Exam Triage Vital Signs ED Triage Vitals  Enc Vitals Group     BP 11/17/18 1638 121/78     Pulse --      Resp 11/17/18 1638 18     Temp 11/17/18 1638 98.4 F (36.9 C)  Temp Source 11/17/18 1638 Oral     SpO2 11/17/18 1638 96 %     Weight --      Height --      Head Circumference --      Peak Flow --      Pain Score 11/17/18 1640 8     Pain Loc --      Pain Edu? --      Excl. in [place]? --    No data found.  Updated Vital Signs BP 121/78 (BP Location: Right Arm)   Temp 98.4 F (36.9 C) (Oral)   Resp 18   SpO2 96%   Physical Exam Constitutional:      General: He is not in acute distress.    Appearance: He is well-developed. He is not diaphoretic.  HENT:     Head: Normocephalic and atraumatic.  Eyes:     Conjunctiva/sclera: Conjunctivae normal.     Pupils: Pupils are equal, round, and reactive to light.  Cardiovascular:     Rate and Rhythm: Normal rate and regular rhythm.     Heart sounds: Normal heart sounds. No murmur. No friction rub. No gallop.   Pulmonary:     Effort: Pulmonary effort is  normal. No accessory muscle usage or respiratory distress.     Breath sounds: Normal breath sounds. No stridor. No decreased breath sounds, wheezing, rhonchi or rales.  Musculoskeletal:     Comments: No tenderness on palpation of the spinous processes.  Tenderness to palpation of left upper thoracic back.  No tenderness to palpation of the shoulder.  Full range of motion of neck, shoulder, back. Strength normal and equal bilaterally.  Normal grip strength.  Sensation intact and equal bilaterally.  Radial pulses 2+ and equal bilaterally. Capillary refill less than 2 seconds.   Skin:    General: Skin is warm and dry.  Neurological:     Mental Status: He is alert and oriented to person, place, and time.    UC Treatments / Results  Labs (all labs ordered are listed, but only abnormal results are displayed) Labs Reviewed - No data to display  EKG   Radiology No results found.  Procedures Procedures (including critical care time)  Medications Ordered in UC Medications - No data to display  Initial Impression / Assessment and Plan / UC Course  I have reviewed the triage vital signs and the nursing notes.  Pertinent labs & imaging results that were available during my care of the patient were reviewed by me and considered in my medical decision making (see chart for details).    Start NSAID as directed for pain and inflammation. Muscle relaxant as needed. Ice/heat compresses. Discussed with patient strain can take up to 3-4 weeks to resolve, but should be getting better each week. Return precautions given.   Final Clinical Impressions(s) / UC Diagnoses   Final diagnoses:  Acute left-sided thoracic back pain   ED Prescriptions    Medication Sig Dispense Auth. Provider   methocarbamol (ROBAXIN) 500 MG tablet Take 1 tablet (500 mg total) by mouth 2 (two) times daily. 20 tablet Tobin Chad, [place] 11/17/18 1742

## 2018-11-17 NOTE — ED Triage Notes (Signed)
Pt presents to UC w/ c/o back pain starting Sunday morning. Pt reports taking ibuprofen and oxycodone 5mg  Sunday morning which helped some. Pt reports using heat on his back which helped. PT reports falling on Friday but did not experience any pain after that. Pt states he golfed 18 holes on Saturday without pain. Pt reports the pain shoots down left arm at times and the pain is unbearable.

## 2019-03-02 ENCOUNTER — Other Ambulatory Visit: Payer: Self-pay

## 2019-03-02 ENCOUNTER — Ambulatory Visit (INDEPENDENT_AMBULATORY_CARE_PROVIDER_SITE_OTHER): Payer: BC Managed Care – PPO

## 2019-03-02 DIAGNOSIS — Z23 Encounter for immunization: Secondary | ICD-10-CM | POA: Diagnosis not present

## 2020-02-08 ENCOUNTER — Telehealth: Payer: Self-pay

## 2020-02-08 ENCOUNTER — Emergency Department (HOSPITAL_BASED_OUTPATIENT_CLINIC_OR_DEPARTMENT_OTHER)
Admission: EM | Admit: 2020-02-08 | Discharge: 2020-02-08 | Disposition: A | Discharge status: Home / Self Care | Payer: BC Managed Care – PPO | Attending: Emergency Medicine | Admitting: Emergency Medicine

## 2020-02-08 ENCOUNTER — Emergency Department
Admission: EM | Admit: 2020-02-08 | Discharge: 2020-02-08 | Disposition: A | Discharge status: Home / Self Care | Payer: BC Managed Care – PPO

## 2020-02-08 ENCOUNTER — Other Ambulatory Visit: Payer: Self-pay

## 2020-02-08 ENCOUNTER — Encounter (HOSPITAL_BASED_OUTPATIENT_CLINIC_OR_DEPARTMENT_OTHER): Payer: Self-pay | Admitting: Emergency Medicine

## 2020-02-08 ENCOUNTER — Emergency Department (HOSPITAL_BASED_OUTPATIENT_CLINIC_OR_DEPARTMENT_OTHER): Payer: BC Managed Care – PPO

## 2020-02-08 DIAGNOSIS — R0609 Other forms of dyspnea: Secondary | ICD-10-CM | POA: Diagnosis not present

## 2020-02-08 DIAGNOSIS — J45909 Unspecified asthma, uncomplicated: Secondary | ICD-10-CM | POA: Diagnosis not present

## 2020-02-08 DIAGNOSIS — R002 Palpitations: Secondary | ICD-10-CM | POA: Insufficient documentation

## 2020-02-08 DIAGNOSIS — R0789 Other chest pain: Secondary | ICD-10-CM | POA: Diagnosis not present

## 2020-02-08 DIAGNOSIS — R0600 Dyspnea, unspecified: Secondary | ICD-10-CM

## 2020-02-08 DIAGNOSIS — E039 Hypothyroidism, unspecified: Secondary | ICD-10-CM | POA: Diagnosis not present

## 2020-02-08 LAB — CBC
HCT: 43.9 % (ref 39.0–52.0)
Hemoglobin: 14.9 g/dL (ref 13.0–17.0)
MCH: 31.2 pg (ref 26.0–34.0)
MCHC: 33.9 g/dL (ref 30.0–36.0)
MCV: 92 fL (ref 80.0–100.0)
Platelets: 279 10*3/uL (ref 150–400)
RBC: 4.77 MIL/uL (ref 4.22–5.81)
RDW: 12.7 % (ref 11.5–15.5)
WBC: 12 10*3/uL — ABNORMAL HIGH (ref 4.0–10.5)
nRBC: 0 % (ref 0.0–0.2)

## 2020-02-08 LAB — BASIC METABOLIC PANEL
Anion gap: 10 (ref 5–15)
BUN: 21 mg/dL — ABNORMAL HIGH (ref 6–20)
CO2: 23 mmol/L (ref 22–32)
Calcium: 8.8 mg/dL — ABNORMAL LOW (ref 8.9–10.3)
Chloride: 103 mmol/L (ref 98–111)
Creatinine, Ser: 1.37 mg/dL — ABNORMAL HIGH (ref 0.61–1.24)
GFR, Estimated: >60 mL/min (ref >60)
Glucose, Bld: 94 mg/dL (ref 70–99)
Potassium: 3.9 mmol/L (ref 3.5–5.1)
Sodium: 136 mmol/L (ref 135–145)

## 2020-02-08 LAB — TROPONIN I (HIGH SENSITIVITY)
Troponin I (High Sensitivity): 3 ng/L (ref <18)
Troponin I (High Sensitivity): 2 ng/L (ref <18)

## 2020-02-08 LAB — TSH: TSH: 3.506 u[IU]/mL (ref 0.350–4.500)

## 2020-02-08 NOTE — ED Provider Notes (Signed)
[place] EMERGENCY DEPARTMENT Provider Note   CSN: [id number] Arrival date & time: 02/08/20  1518     History Chief Complaint  Patient presents with  . Palpitations    Ryan [name] is a 54 y.o. male presenting to the emergency department with complaint of 3 months of intermittent palpitations.  He states regularly he wakes up between 4 and 5 AM with heart racing palpitations.  They last about 30 minutes in duration and resolve spontaneously.  He feels some mild chest tightness with his symptoms.  He states he also notices increased shortness of breath with any exertion.  He is not feeling short of breath with his palpitations, and his palpitations are not occurring when he is exerting himself.  Symptoms are not changing or coming more frequently.  He was scheduled for PCP appointment and they were concerned about his symptoms therefore sent in for evaluation.  He also reports feeling quite cold frequently.  Denies associated diaphoresis, nausea, leg swelling.  No known cardiac history.  No history of hypertension, diabetes, hyperlipidemia.  o  The history is provided by the patient.       Past Medical History:  Diagnosis Date  . Allergy    mild  . Asthma    athletic induced asthma age 43   . Bronchitis    with viral infection nov/Dec 2019  . Deviated septum   . Nephritis 1972    Patient Active Problem List   Diagnosis Date Noted  . Tachycardia 02/17/2018  . Bronchitis 02/17/2018  . Subclinical hypothyroidism 08/06/2017  . Elevated low density lipoprotein (LDL) cholesterol level 08/06/2017  . Obesity, Class I, BMI 30-34.9 08/06/2017  . Vitamin D insufficiency 03/05/2017  . S/P appendectomy 1985 or so 02/05/2017  . Family history of diverticulitis of colon 02/05/2017  . Family history of diabetes mellitus in maternal grandmother 02/05/2017  . Family history of heart disease in male family member before age 107 02/05/2017  . Environmental and seasonal  allergies 02/05/2017  . Nephritis- 1972 03/19/1970    Past Surgical History:  Procedure Laterality Date  . APPENDECTOMY  1984       Family History  Problem Relation Age of Onset  . Alcohol abuse Mother   . Heart attack Father   . Diabetes Maternal Grandfather   . Pancreatic cancer Maternal Aunt   . Colon cancer Neg Hx   . Colon polyps Neg Hx   . Esophageal cancer Neg Hx   . Rectal cancer Neg Hx   . Stomach cancer Neg Hx     Social History   Tobacco Use  . Smoking status: Never Smoker  . Smokeless tobacco: Never Used  Vaping Use  . Vaping Use: Never used  Substance Use Topics  . Alcohol use: Yes    Alcohol/week: 1.0 standard drink    Types: 1 Standard drinks or equivalent per week    Comment: social  . Drug use: No    Home Medications Prior to Admission medications   Medication Sig Start Date End Date Taking? Authorizing Provider  methocarbamol (ROBAXIN) 500 MG tablet Take 1 tablet (500 mg total) by mouth 2 (two) times daily. 11/17/18   Tasia Catchings, Amy V, PA-C  Vitamin D, Ergocalciferol, (DRISDOL) 1.25 MG (50000 UT) CAPS capsule TAKE 1 CAPSULE (50,000 UNITS TOTAL) BY MOUTH EVERY 7 (SEVEN) DAYS. 05/08/18   [name], DO    Allergies    Patient has no known allergies.  Review of Systems   Review of Systems  Respiratory: Positive for chest tightness and shortness of breath.   Cardiovascular: Positive for palpitations.  All other systems reviewed and are negative.   Physical Exam Updated Vital Signs BP 124/85   Pulse 76   Temp 98.1 F (36.7 C)   Resp 20   Ht 6' (1.829 m)   Wt 100.7 kg   SpO2 100%   BMI 30.11 kg/m   Physical Exam Vitals and nursing note reviewed.  Constitutional:      General: He is not in acute distress.    Appearance: He is well-developed. He is not ill-appearing.  HENT:     Head: Normocephalic and atraumatic.  Eyes:     Conjunctiva/sclera: Conjunctivae normal.  Cardiovascular:     Rate and Rhythm: Normal rate and regular  rhythm.     Pulses: Normal pulses.  Pulmonary:     Effort: Pulmonary effort is normal. No respiratory distress.     Breath sounds: Normal breath sounds.  Chest:     Chest wall: No tenderness.  Abdominal:     General: Bowel sounds are normal.     Palpations: Abdomen is soft.     Tenderness: There is no abdominal tenderness.  Musculoskeletal:     Right lower leg: No edema.     Left lower leg: No edema.  Skin:    General: Skin is warm.  Neurological:     Mental Status: He is alert.  Psychiatric:        Behavior: Behavior normal.     ED Results / Procedures / Treatments   Labs (all labs ordered are listed, but only abnormal results are displayed) Labs Reviewed  BASIC METABOLIC PANEL - Abnormal; Notable for the following components:      Result Value   BUN 21 (*)    Creatinine, Ser 1.37 (*)    Calcium 8.8 (*)    All other components within normal limits  CBC - Abnormal; Notable for the following components:   WBC 12.0 (*)    All other components within normal limits  TSH  TROPONIN I (HIGH SENSITIVITY)  TROPONIN I (HIGH SENSITIVITY)    EKG EKG Interpretation  Date/Time:  Monday February 08 2020 15:23:49 EST Ventricular Rate:  97 PR Interval:  156 QRS Duration: 72 QT Interval:  358 QTC Calculation: 454 R Axis:   66 Text Interpretation: Normal sinus rhythm Normal ECG No prior ecg for comparison Confirmed by Veryl Speak [phone]) on 02/08/2020 4:13:41 PM   Radiology DG Chest 2 View  Result Date: 02/08/2020 CLINICAL DATA:  Cardiac palpitations for several months EXAM: CHEST - 2 VIEW COMPARISON:  None. FINDINGS: The heart size and mediastinal contours are within normal limits. Both lungs are clear. The visualized skeletal structures are unremarkable. IMPRESSION: No active cardiopulmonary disease. Electronically Signed   By: Inez Catalina M.D.   On: 02/08/2020 16:08    Procedures Procedures (including critical care time)  Medications Ordered in ED Medications - No  data to display  ED Course  I have reviewed the triage vital signs and the nursing notes.  Pertinent labs & imaging results that were available during my care of the patient were reviewed by me and considered in my medical decision making (see chart for details).  Clinical Course as of Feb 07 1954  Mon Feb 08, 2020  [place] with cardiologist Dr. Debara Pickett.  He recommends patient is appropriate for outpatient work-up given minimal risk factors and negative troponins, nonischemic EKG today.     [JR]  Clinical Course User Index [JR] Hagan Vanauken, [place] N, PA-C   MDM Rules/Calculators/A&P                          Patient presenting for 3 months of palpitations feels like her is heart racing.  They generally occur in the morning time as he is waking up.  He also notices shortness of breath on exertion with some mild intermittent chest tightness.  PCP sent him here for evaluation though states his symptoms have been ongoing for multiple months.  No cardiac history, no significant risk factors for ACS.  Troponins are negative x2.  EKG here is normal sinus rhythm, nonischemic.  Chest x-ray is negative.  No significant electrolyte imbalances.  TSH is sent and pending.    Consulted with cardiologist, Dr. Debara Pickett.  Recommends patient is appropriate for outpatient follow-up for Holter monitor.  Suspect intermittent arrhythmia as a source of his symptoms.  Will provide cardiology referral and instructed patient to follow close with PCP to arrange for Holter monitor.   Discussed results, findings, treatment and follow up. Patient advised of strict return precautions. Patient verbalized understanding and agreed with plan. Final Clinical Impression(s) / ED Diagnoses Final diagnoses:  Palpitations  Dyspnea on exertion    Rx / DC Orders ED Discharge Orders    None       Dorma Altman, [place] N, PA-C 02/08/20 Gus Puma, MD 02/09/20 [phone]

## 2020-02-08 NOTE — Telephone Encounter (Signed)
Noted that pt had an appointment for 02/10/2020 for "heart arrhythmia".  Called and spoke with pt who stated that his heart seems to be racing in the mornings and he has noticed increased SOB with exertion.  Advised pt of potential critical issues that could be causing his symptoms.  Advised pt that he should be seen immediately at ED for evaluation.  Since pt states that he is in [place] at this time, therefore, advised pt to be seen at closest ED which is [facility].  Pt expressed understanding and is agreeable.  Appt for 11/24/202 cancelled.  Charyl Bigger, CMA

## 2020-02-08 NOTE — Discharge Instructions (Addendum)
Please follow closely with your primary care provider.  You will benefit from a Holter monitor to monitor for any arrhythmias. Establish care with cardiology to follow-up on your visit today.   Please return to the emergency department if you develop any worsening symptoms, including persistent palpitations, persistent chest pain with palpitations, worsening shortness of breath, exertional chest pain, or other concerning symptoms.

## 2020-02-08 NOTE — ED Triage Notes (Signed)
FIRST NURSE: Pt states called MD this AM and made appointment for later this week related to palpitations for several weeks.  Pt states MD called him back later and told him to come here for evaluation.  Pt ambulatory without difficulty, NAD noted.

## 2020-02-08 NOTE — ED Notes (Signed)
Pt to registration desk states he is going to leave at this time and try another ER.  Pt encouraged to wait to be and informed that most ERs are busy these days.  Pt again states he is leaving at this time.

## 2020-02-08 NOTE — ED Triage Notes (Signed)
Pt having intermittent palpitations with some sob for several months.  Last episode this am and lasted for 20 minutes.

## 2020-02-10 ENCOUNTER — Ambulatory Visit: Payer: BC Managed Care – PPO | Admitting: Physician Assistant

## 2020-02-19 ENCOUNTER — Other Ambulatory Visit: Payer: Self-pay

## 2020-02-19 ENCOUNTER — Encounter: Payer: Self-pay | Admitting: Cardiology

## 2020-02-19 ENCOUNTER — Ambulatory Visit: Payer: BC Managed Care – PPO | Admitting: Cardiology

## 2020-02-19 ENCOUNTER — Telehealth: Payer: Self-pay | Admitting: Radiology

## 2020-02-19 VITALS — BP 110/70 | HR 80 | Ht 72.0 in | Wt 223.0 lb

## 2020-02-19 DIAGNOSIS — R0602 Shortness of breath: Secondary | ICD-10-CM | POA: Diagnosis not present

## 2020-02-19 DIAGNOSIS — R002 Palpitations: Secondary | ICD-10-CM

## 2020-02-19 NOTE — Progress Notes (Signed)
Cardiology Office Note:    Date:  02/19/2020   ID:  Ryan [name], DOB April 01, 1965, MRN [id number]  PCP:  Ryan Reid, PA-C  [facility] Cardiologist:  No primary care provider on file.  [facility] Electrophysiologist:  None   Referring MD: Ryan Reid, PA-C     History of Present Illness:    Ryan [name] is a 54 y.o. male here for the evaluation of palpitations.  At the request of [place], [place].   On 02/08/2019 when he was in the emergency department with complaints of 6 months of intermittent palpitations.  Wakes up between 4 and 5 AM with his heart racing.  They last sometimes 15-30 minutes and then resolve spontaneously.  Sometimes he may feel a little bit of chest tightness with this.  He notices some shortness of breath with exertion but not necessarily with palpitations.  The skips do not occur while he is exerting himself.    No syncope. Coffee in the morning, no changes.  No supplements.  He and his wife have been on a diet over the past 8 months.  Has lost some weight.  No tobacco  Father had angina at 1, died MI 82. FGF 40's died MI.   In bed 9:15pm wake up 6AM without alarm clock.  No nightmares.  Deviated septum. Never tested sleep apnea. No jarring wake up. No anxious.  Rare, social alcohol.  Past Medical History:  Diagnosis Date  . Allergy    mild  . Asthma    athletic induced asthma age 66   . Bronchitis    with viral infection nov/Dec 2019  . Deviated septum   . Nephritis 1972    Past Surgical History:  Procedure Laterality Date  . APPENDECTOMY  1984    Current Medications: No outpatient medications have been marked as taking for the 02/19/20 encounter (Office Visit) with Jerline Pain, MD.     Allergies:   Patient has no known allergies.   Social History   Socioeconomic History  . Marital status: Married    Spouse name: Not on file  . Number of children: 4  . Years of education: Not on file  . Highest education level: Master's  degree (e.g., MA, MS, MEng, MEd, MSW, MBA)  Occupational History  . Occupation: Engineer, agricultural: [employer]  Tobacco Use  . Smoking status: Never Smoker  . Smokeless tobacco: Never Used  Vaping Use  . Vaping Use: Never used  Substance and Sexual Activity  . Alcohol use: Yes    Alcohol/week: 1.0 standard drink    Types: 1 Standard drinks or equivalent per week    Comment: social  . Drug use: No  . Sexual activity: Yes    Birth control/protection: None  Other Topics Concern  . Not on file  Social History Narrative  . Not on file   Social Determinants of Health   Financial Resource Strain:   . Difficulty of Paying Living Expenses: Not on file  Food Insecurity:   . Worried About Charity fundraiser in the Last Year: Not on file  . Ran Out of Food in the Last Year: Not on file  Transportation Needs:   . Lack of Transportation (Medical): Not on file  . Lack of Transportation (Non-Medical): Not on file  Physical Activity:   . Days of Exercise per Week: Not on file  . Minutes of Exercise per Session: Not on file  Stress:   .  Feeling of Stress : Not on file  Social Connections:   . Frequency of Communication with Friends and Family: Not on file  . Frequency of Social Gatherings with Friends and Family: Not on file  . Attends Religious Services: Not on file  . Active Member of Clubs or Organizations: Not on file  . Attends Archivist Meetings: Not on file  . Marital Status: Not on file     Family History: The patient's family history includes Alcohol abuse in his mother; Diabetes in his maternal grandfather; Heart attack in his father; Pancreatic cancer in his maternal aunt. There is no history of Colon cancer, Colon polyps, Esophageal cancer, Rectal cancer, or Stomach cancer.  ROS:   Please see the history of present illness.    No fevers chills nausea vomiting syncope bleeding all other systems reviewed and are  negative.  EKGs/Labs/Other Studies Reviewed:    The following studies were reviewed today: Chest x-ray personally reviewed and interpreted from ER shows normal cardiac dimensions normal aorta.  EKG:  EKG is  ordered today.  The ekg ordered today demonstrates sinus rhythm 80 no other changes.  Recent Labs: 02/08/2020: BUN 21; Creatinine, Ser 1.37; Hemoglobin 14.9; Platelets 279; Potassium 3.9; Sodium 136; TSH 3.506  Recent Lipid Panel    Component Value Date/Time   CHOL 225 (H) 02/05/2018 0825   TRIG 130 02/05/2018 0825   HDL 46 02/05/2018 0825   CHOLHDL 4.9 02/05/2018 0825   LDLCALC 153 (H) 02/05/2018 0825     Risk Assessment/Calculations:       Physical Exam:    VS:  BP 110/70 (BP Location: Left Arm, Patient Position: Sitting, Cuff Size: Normal)   Pulse 80   Ht 6' (1.829 m)   Wt 223 lb (101.2 kg)   SpO2 96%   BMI 30.24 kg/m     Wt Readings from Last 3 Encounters:  02/19/20 223 lb (101.2 kg)  02/08/20 222 lb (100.7 kg)  05/27/18 237 lb (107.5 kg)     GEN:  Well nourished, well developed in no acute distress HEENT: Normal NECK: No JVD; No carotid bruits LYMPHATICS: No lymphadenopathy CARDIAC: RRR, no murmurs, rubs, gallops RESPIRATORY:  Clear to auscultation without rales, wheezing or rhonchi  ABDOMEN: Soft, non-tender, non-distended MUSCULOSKELETAL:  No edema; No deformity  SKIN: Warm and dry NEUROLOGIC:  Alert and oriented x 3 PSYCHIATRIC:  Normal affect   ASSESSMENT:    1. Palpitations   2. Shortness of breath    PLAN:    In order of problems listed above:  Palpitations -Has been waking up frequently, almost every morning with racing heartbeat.  Anxiety, no startling. -We will check a Zio patch monitor to ensure that there are no adverse arrhythmias present. -After this testing, we will proceed with potential treatment strategies for this.  Dyspnea -Echocardiogram to ensure proper structure and function.  Mild chest tension -With his family  history, if this were to progress, we would have low threshold for further cardiac testing such as CT of coronary arteries.  We will monitor closely.    Medication Adjustments/Labs and Tests Ordered: Current medicines are reviewed at length with the patient today.  Concerns regarding medicines are outlined above.  Orders Placed This Encounter  Procedures  . LONG TERM MONITOR (3-14 DAYS)  . EKG 12-Lead  . ECHOCARDIOGRAM COMPLETE   No orders of the defined types were placed in this encounter.   Patient Instructions  Medication Instructions:  The current medical regimen is effective;  continue present plan and medications.  *If you need a refill on your cardiac medications before your next appointment, please call your pharmacy*  Testing/Procedures: Your physician has requested that you have an echocardiogram. Echocardiography is a painless test that uses sound waves to create images of your heart. It provides your doctor with information about the size and shape of your heart and how well your heart's chambers and valves are working. This procedure takes approximately one hour. There are no restrictions for this procedure.  ZIO XT- Long Term Monitor Instructions   Your physician has requested you wear your ZIO patch monitor 14 days.   This is a single patch monitor.  Irhythm supplies one patch monitor per enrollment.  Additional stickers are not available.   Please do not apply patch if you will be having a Nuclear Stress Test, Echocardiogram, Cardiac CT, MRI, or Chest Xray during the time frame you would be wearing the monitor. The patch cannot be worn during these tests.  You cannot remove and re-apply the ZIO XT patch monitor.   Your ZIO patch monitor will be sent USPS Priority mail from [facility] directly to your home address. The monitor may also be mailed to a PO BOX if home delivery is not available.   It may take 3-5 days to receive your monitor after you have been  enrolled.   Once you have received you monitor, please review enclosed instructions.  Your monitor has already been registered assigning a specific monitor serial # to you.   Applying the monitor   Shave hair from upper left chest.   Hold abrader disc by orange tab.  Rub abrader in 40 strokes over left upper chest as indicated in your monitor instructions.   Clean area with 4 enclosed alcohol pads .  Use all pads to assure are is cleaned thoroughly.  Let dry.   Apply patch as indicated in monitor instructions.  Patch will be place under collarbone on left side of chest with arrow pointing upward.   Rub patch adhesive wings for 2 minutes.Remove white label marked "1".  Remove white label marked "2".  Rub patch adhesive wings for 2 additional minutes.   While looking in a mirror, press and release button in center of patch.  A small green light will flash 3-4 times .  This will be your only indicator the monitor has been turned on.     Do not shower for the first 24 hours.  You may shower after the first 24 hours.   Press button if you feel a symptom. You will hear a small click.  Record Date, Time and Symptom in the Patient Log Book.   When you are ready to remove patch, follow instructions on last 2 pages of Patient Log Book.  Stick patch monitor onto last page of Patient Log Book.   Place Patient Log Book in [place] box.  Use locking tab on box and tape box closed securely.  The [employer] and [employer] has [employer] on it.  Please place in mailbox as soon as possible.  Your physician should have your test results approximately 7 days after the monitor has been mailed back to [facility].   Call [place] at [phone] if you have questions regarding your ZIO XT patch monitor.  Call them immediately if you see an orange light blinking on your monitor.   If your monitor falls off in less than 4 days contact our Monitor department at [phone].  If your monitor  becomes loose or falls off after 4 days call [employer] at [phone] for suggestions on securing your monitor.   Follow-Up: At [facility], you and your health needs are our priority.  As part of our continuing mission to provide you with exceptional heart care, we have created designated Provider Care Teams.  These Care Teams include your primary Cardiologist (physician) and Advanced Practice Providers (APPs -  Physician Assistants and Nurse Practitioners) who all work together to provide you with the care you need, when you need it.  We recommend signing up for the patient portal called "MyChart".  Sign up information is provided on this After Visit Summary.  MyChart is used to connect with patients for Virtual Visits (Telemedicine).  Patients are able to view lab/test results, encounter notes, upcoming appointments, etc.  Non-urgent messages can be sent to your provider as well.   To learn more about what you can do with MyChart, go to [email or web address].    Your next appointment:   6 month(s)  The format for your next appointment:   In Person  Provider:   Candee Furbish, MD   Thank you for choosing [facility]!!        Signed, Candee Furbish, MD  02/19/2020 2:25 PM    [place]

## 2020-02-19 NOTE — Patient Instructions (Addendum)
Medication Instructions:  The current medical regimen is effective;  continue present plan and medications.  *If you need a refill on your cardiac medications before your next appointment, please call your pharmacy*  Testing/Procedures: Your physician has requested that you have an echocardiogram. Echocardiography is a painless test that uses sound waves to create images of your heart. It provides your doctor with information about the size and shape of your heart and how well your heart's chambers and valves are working. This procedure takes approximately one hour. There are no restrictions for this procedure.  ZIO XT- Long Term Monitor Instructions   Your physician has requested you wear your ZIO patch monitor 14 days.   This is a single patch monitor.  Irhythm supplies one patch monitor per enrollment.  Additional stickers are not available.   Please do not apply patch if you will be having a Nuclear Stress Test, Echocardiogram, Cardiac CT, MRI, or Chest Xray during the time frame you would be wearing the monitor. The patch cannot be worn during these tests.  You cannot remove and re-apply the ZIO XT patch monitor.   Your ZIO patch monitor will be sent USPS Priority mail from [facility] directly to your home address. The monitor may also be mailed to a PO BOX if home delivery is not available.   It may take 3-5 days to receive your monitor after you have been enrolled.   Once you have received you monitor, please review enclosed instructions.  Your monitor has already been registered assigning a specific monitor serial # to you.   Applying the monitor   Shave hair from upper left chest.   Hold abrader disc by orange tab.  Rub abrader in 40 strokes over left upper chest as indicated in your monitor instructions.   Clean area with 4 enclosed alcohol pads .  Use all pads to assure are is cleaned thoroughly.  Let dry.   Apply patch as indicated in monitor instructions.  Patch  will be place under collarbone on left side of chest with arrow pointing upward.   Rub patch adhesive wings for 2 minutes.Remove white label marked "1".  Remove white label marked "2".  Rub patch adhesive wings for 2 additional minutes.   While looking in a mirror, press and release button in center of patch.  A small green light will flash 3-4 times .  This will be your only indicator the monitor has been turned on.     Do not shower for the first 24 hours.  You may shower after the first 24 hours.   Press button if you feel a symptom. You will hear a small click.  Record Date, Time and Symptom in the Patient Log Book.   When you are ready to remove patch, follow instructions on last 2 pages of Patient Log Book.  Stick patch monitor onto last page of Patient Log Book.   Place Patient Log Book in [place] box.  Use locking tab on box and tape box closed securely.  The [employer] and [employer] has [employer] on it.  Please place in mailbox as soon as possible.  Your physician should have your test results approximately 7 days after the monitor has been mailed back to [facility].   Call [place] at [phone] if you have questions regarding your ZIO XT patch monitor.  Call them immediately if you see an orange light blinking on your monitor.   If your monitor falls off in  less than 4 days contact our Monitor department at [phone].  If your monitor becomes loose or falls off after 4 days call [employer] at [phone] for suggestions on securing your monitor.   Follow-Up: At [facility], you and your health needs are our priority.  As part of our continuing mission to provide you with exceptional heart care, we have created designated Provider Care Teams.  These Care Teams include your primary Cardiologist (physician) and Advanced Practice Providers (APPs -  Physician Assistants and Nurse Practitioners) who all work together to provide you with the care you need, when  you need it.  We recommend signing up for the patient portal called "MyChart".  Sign up information is provided on this After Visit Summary.  MyChart is used to connect with patients for Virtual Visits (Telemedicine).  Patients are able to view lab/test results, encounter notes, upcoming appointments, etc.  Non-urgent messages can be sent to your provider as well.   To learn more about what you can do with MyChart, go to [email or web address].    Your next appointment:   6 month(s)  The format for your next appointment:   In Person  Provider:   Candee Furbish, MD   Thank you for choosing [facility]!!

## 2020-02-19 NOTE — Telephone Encounter (Signed)
Enrolled patient for a 14 day Zio XT  monitor to be mailed to patients home  °

## 2020-02-24 ENCOUNTER — Ambulatory Visit (INDEPENDENT_AMBULATORY_CARE_PROVIDER_SITE_OTHER): Payer: BC Managed Care – PPO

## 2020-02-24 DIAGNOSIS — R0602 Shortness of breath: Secondary | ICD-10-CM

## 2020-02-24 DIAGNOSIS — R002 Palpitations: Secondary | ICD-10-CM | POA: Diagnosis not present

## 2020-03-03 ENCOUNTER — Other Ambulatory Visit: Payer: Self-pay

## 2020-03-03 ENCOUNTER — Ambulatory Visit: Payer: BC Managed Care – PPO | Admitting: Physician Assistant

## 2020-03-03 ENCOUNTER — Encounter: Payer: Self-pay | Admitting: Physician Assistant

## 2020-03-03 VITALS — BP 116/75 | HR 77 | Ht 72.0 in | Wt 226.3 lb

## 2020-03-03 DIAGNOSIS — R002 Palpitations: Secondary | ICD-10-CM | POA: Diagnosis not present

## 2020-03-03 NOTE — Patient Instructions (Signed)

## 2020-03-03 NOTE — Progress Notes (Signed)
Established Patient Office Visit  Subjective:  Patient ID: Ryan [name], male    DOB: 09-13-1965  Age: 54 y.o. MRN: [id number]  CC:  Chief Complaint  Patient presents with   Hospitalization Follow-up    HPI Ryan [name] presents for ED follow up.  Patient was evaluated by the ED 02/08/2020 for intermittent palpitations and work-up was essentially normal. Patient saw cardiology a few weeks ago and is currently wearing a Holter monitor.  Reports symptoms are about the same.  Continues to have intermittent palpitations, wakes up with his heart racing.  Symptoms started about 6 months ago.  Patient also reports a few episodes of shortness of breath with going upstairs which has not changed denies chest pain, dizziness, edema, daytime sleepiness or wheezing. No recent major life events or increased stress. Never a smoker. Family history is pertinent for MI and angina. Denies a prior history of anxiety and unsure if symptoms could possibly be related anxiety. States he has always picked at his skin (fingertips) but does not feel like he is an anxious person. He tends to remain calm under stressful situations.  Past Medical History:  Diagnosis Date   Allergy    mild   Asthma    athletic induced asthma age 60    Bronchitis    with viral infection nov/Dec 2019   Deviated septum    Nephritis 1972    Past Surgical History:  Procedure Laterality Date   APPENDECTOMY  1984    Family History  Problem Relation Age of Onset   Alcohol abuse Mother    Heart attack Father    Diabetes Maternal Grandfather    Pancreatic cancer Maternal Aunt    Colon cancer Neg Hx    Colon polyps Neg Hx    Esophageal cancer Neg Hx    Rectal cancer Neg Hx    Stomach cancer Neg Hx     Social History   Socioeconomic History   Marital status: Married    Spouse name: Not on file   Number of children: 4   Years of education: Not on file   Highest education level: Master's degree (e.g.,  MA, MS, MEng, MEd, MSW, MBA)  Occupational History   Occupation: Engineer, agricultural: [employer]  Tobacco Use   Smoking status: Never Smoker   Smokeless tobacco: Never Used  Scientific laboratory technician Use: Never used  Substance and Sexual Activity   Alcohol use: Yes    Alcohol/week: 1.0 standard drink    Types: 1 Standard drinks or equivalent per week    Comment: social   Drug use: No   Sexual activity: Yes    Birth control/protection: None  Other Topics Concern   Not on file  Social History Narrative   Not on file   Social Determinants of Health   Financial Resource Strain: Not on file  Food Insecurity: Not on file  Transportation Needs: Not on file  Physical Activity: Not on file  Stress: Not on file  Social Connections: Not on file  Intimate Partner Violence: Not on file    No outpatient medications prior to visit.   No facility-administered medications prior to visit.    No Known Allergies  ROS Review of Systems A fourteen system review of systems was performed and found to be positive as per HPI.    Objective:    Physical Exam General:  Well Developed, well nourished, appropriate for stated age.  Neuro:  Alert and oriented,  extra-ocular muscles intact  HEENT:  Normocephalic, atraumatic, neck supple Skin:  no gross rash, warm, pink. Cardiac:  RRR, S1 S2, w/o murmur Respiratory:  ECTA B/L, Not using accessory muscles, speaking in full sentences- unlabored. Vascular:  Ext warm, no cyanosis apprec.; cap RF less 2 sec. Psych:  No HI/SI, judgement and insight good, Euthymic mood. Full Affect.  BP 116/75    Pulse 77    Ht 6' (1.829 m)    Wt 226 lb 4.8 oz (102.6 kg)    SpO2 98%    BMI 30.69 kg/m  Wt Readings from Last 3 Encounters:  03/03/20 226 lb 4.8 oz (102.6 kg)  02/19/20 223 lb (101.2 kg)  02/08/20 222 lb (100.7 kg)     Health Maintenance Due  Topic Date Due   Hepatitis C Screening  Never done   HIV Screening   Never done   COLONOSCOPY  Never done   TETANUS/TDAP  03/21/2019    There are no preventive care reminders to display for this patient.  Lab Results  Component Value Date   TSH 3.506 02/08/2020   Lab Results  Component Value Date   WBC 12.0 (H) 02/08/2020   HGB 14.9 02/08/2020   HCT 43.9 02/08/2020   MCV 92.0 02/08/2020   PLT 279 02/08/2020   Lab Results  Component Value Date   NA 136 02/08/2020   K 3.9 02/08/2020   CO2 23 02/08/2020   GLUCOSE 94 02/08/2020   BUN 21 (H) 02/08/2020   CREATININE 1.37 (H) 02/08/2020   BILITOT 0.5 02/05/2018   ALKPHOS 51 02/05/2018   AST 19 02/05/2018   ALT 25 02/05/2018   PROT 6.7 02/05/2018   ALBUMIN 4.3 02/05/2018   CALCIUM 8.8 (L) 02/08/2020   ANIONGAP 10 02/08/2020   Lab Results  Component Value Date   CHOL 225 (H) 02/05/2018   Lab Results  Component Value Date   HDL 46 02/05/2018   Lab Results  Component Value Date   LDLCALC 153 (H) 02/05/2018   Lab Results  Component Value Date   TRIG 130 02/05/2018   Lab Results  Component Value Date   CHOLHDL 4.9 02/05/2018   Lab Results  Component Value Date   HGBA1C 5.5 02/05/2018      Assessment & Plan:   Problem List Items Addressed This Visit   None   Visit Diagnoses    Palpitations    -  Primary     Palpitations:  -Discussed with patient cardiac and non-cardiac etiologies for palpitations. Reviewed ED labs and no signs of anemia or thyroid disease. Renal function stable and troponin negative. CXR revealed no active cardiopulmonary disease. -Recommend to continue with cardiology treatment plan with Holter Monitor and echocardiogram. Discussed with patient if pending diagnostic studies reveal no clear cause of symptoms recommend further evaluation with cardiac testing given family history as well patient's elevated LDL. Plan to repeat lipid panel with CPE in a few weeks. -Patient has a history of subclinical hypothyroidism so will also repeat TSH. -Discussed with  patient if cardiology work-up is negative, then recommend to consider management options for palpitations or possible anxiety. Patient verbalized understanding.   No orders of the defined types were placed in this encounter.   Follow-up: Return for CPE and FBW/Vit D few days prior in 4-6 weeks.   Note:  This note was prepared with assistance of Dragon voice recognition software. Occasional wrong-word or sound-a-like substitutions may have occurred due to the inherent limitations of  voice recognition software.  Lorrene Reid, PA-C

## 2020-03-16 ENCOUNTER — Telehealth: Payer: Self-pay

## 2020-03-16 ENCOUNTER — Ambulatory Visit (HOSPITAL_COMMUNITY): Payer: BC Managed Care – PPO | Attending: Internal Medicine

## 2020-03-16 ENCOUNTER — Other Ambulatory Visit: Payer: Self-pay

## 2020-03-16 DIAGNOSIS — R0602 Shortness of breath: Secondary | ICD-10-CM | POA: Insufficient documentation

## 2020-03-16 DIAGNOSIS — R002 Palpitations: Secondary | ICD-10-CM | POA: Insufficient documentation

## 2020-03-16 LAB — ECHOCARDIOGRAM COMPLETE
Area-P 1/2: 2.87 cm2
S' Lateral: 3.1 cm

## 2020-03-16 NOTE — Telephone Encounter (Signed)
-----   Message from Jake Bathe, MD sent at 03/16/2020  2:39 PM EST ----- Normal pump function.  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 Overall reassuring echocardiogram.  Repeat echocardiogram in 1 year to demonstrate stability of aortic root size.  Donato Schultz, MD

## 2020-03-16 NOTE — Telephone Encounter (Signed)
Attempted to call patient with results, no answer and unable to leave a message due to mailbox being full.

## 2020-04-01 ENCOUNTER — Other Ambulatory Visit: Payer: Self-pay | Admitting: Physician Assistant

## 2020-04-01 DIAGNOSIS — Z0000 Encounter for general adult medical examination without abnormal findings: Secondary | ICD-10-CM

## 2020-04-01 DIAGNOSIS — R002 Palpitations: Secondary | ICD-10-CM

## 2020-04-01 DIAGNOSIS — E038 Other specified hypothyroidism: Secondary | ICD-10-CM

## 2020-04-01 DIAGNOSIS — E559 Vitamin D deficiency, unspecified: Secondary | ICD-10-CM

## 2020-04-07 ENCOUNTER — Other Ambulatory Visit: Payer: Self-pay

## 2020-04-07 DIAGNOSIS — R002 Palpitations: Secondary | ICD-10-CM

## 2020-04-07 DIAGNOSIS — Z0000 Encounter for general adult medical examination without abnormal findings: Secondary | ICD-10-CM

## 2020-04-07 DIAGNOSIS — E559 Vitamin D deficiency, unspecified: Secondary | ICD-10-CM

## 2020-04-07 DIAGNOSIS — E038 Other specified hypothyroidism: Secondary | ICD-10-CM

## 2020-04-08 LAB — COMPREHENSIVE METABOLIC PANEL
ALT: 17 IU/L (ref 0–44)
AST: 13 IU/L (ref 0–40)
Albumin/Globulin Ratio: 1.6 (ref 1.2–2.2)
Albumin: 4.4 g/dL (ref 3.8–4.9)
Alkaline Phosphatase: 79 IU/L (ref 44–121)
BUN/Creatinine Ratio: 15 (ref 9–20)
BUN: 21 mg/dL (ref 6–24)
Bilirubin Total: 0.6 mg/dL (ref 0.0–1.2)
CO2: 23 mmol/L (ref 20–29)
Calcium: 9.4 mg/dL (ref 8.7–10.2)
Chloride: 103 mmol/L (ref 96–106)
Creatinine, Ser: 1.42 mg/dL — ABNORMAL HIGH (ref 0.76–1.27)
GFR calc Af Amer: 63 mL/min/{1.73_m2} (ref >59)
GFR calc non Af Amer: 55 mL/min/{1.73_m2} — ABNORMAL LOW (ref >59)
Globulin, Total: 2.7 g/dL (ref 1.5–4.5)
Glucose: 95 mg/dL (ref 65–99)
Potassium: 4.8 mmol/L (ref 3.5–5.2)
Sodium: 139 mmol/L (ref 134–144)
Total Protein: 7.1 g/dL (ref 6.0–8.5)

## 2020-04-08 LAB — LIPID PANEL
Chol/HDL Ratio: 5 ratio (ref 0.0–5.0)
Cholesterol, Total: 205 mg/dL — ABNORMAL HIGH (ref 100–199)
HDL: 41 mg/dL (ref >39)
LDL Chol Calc (NIH): 143 mg/dL — ABNORMAL HIGH (ref 0–99)
Triglycerides: 116 mg/dL (ref 0–149)
VLDL Cholesterol Cal: 21 mg/dL (ref 5–40)

## 2020-04-08 LAB — CBC
Hematocrit: 45.6 % (ref 37.5–51.0)
Hemoglobin: 15.4 g/dL (ref 13.0–17.7)
MCH: 30.4 pg (ref 26.6–33.0)
MCHC: 33.8 g/dL (ref 31.5–35.7)
MCV: 90 fL (ref 79–97)
Platelets: 273 10*3/uL (ref 150–450)
RBC: 5.06 x10E6/uL (ref 4.14–5.80)
RDW: 12.1 % (ref 11.6–15.4)
WBC: 10.1 10*3/uL (ref 3.4–10.8)

## 2020-04-08 LAB — HEMOGLOBIN A1C
Est. average glucose Bld gHb Est-mCnc: 105 mg/dL
Hgb A1c MFr Bld: 5.3 % (ref 4.8–5.6)

## 2020-04-08 LAB — VITAMIN D 25 HYDROXY (VIT D DEFICIENCY, FRACTURES): Vit D, 25-Hydroxy: 41.6 ng/mL (ref 30.0–100.0)

## 2020-04-08 LAB — TSH: TSH: 6.5 u[IU]/mL — ABNORMAL HIGH (ref 0.450–4.500)

## 2020-04-14 ENCOUNTER — Ambulatory Visit (INDEPENDENT_AMBULATORY_CARE_PROVIDER_SITE_OTHER): Payer: BC Managed Care – PPO | Admitting: Physician Assistant

## 2020-04-14 ENCOUNTER — Other Ambulatory Visit: Payer: Self-pay

## 2020-04-14 ENCOUNTER — Encounter: Payer: Self-pay | Admitting: Physician Assistant

## 2020-04-14 VITALS — BP 112/70 | HR 83 | Ht 72.0 in | Wt 226.8 lb

## 2020-04-14 DIAGNOSIS — Z1211 Encounter for screening for malignant neoplasm of colon: Secondary | ICD-10-CM

## 2020-04-14 DIAGNOSIS — E038 Other specified hypothyroidism: Secondary | ICD-10-CM

## 2020-04-14 DIAGNOSIS — N1831 Chronic kidney disease, stage 3a: Secondary | ICD-10-CM

## 2020-04-14 DIAGNOSIS — Z23 Encounter for immunization: Secondary | ICD-10-CM

## 2020-04-14 DIAGNOSIS — R5383 Other fatigue: Secondary | ICD-10-CM

## 2020-04-14 DIAGNOSIS — E785 Hyperlipidemia, unspecified: Secondary | ICD-10-CM

## 2020-04-14 DIAGNOSIS — R0602 Shortness of breath: Secondary | ICD-10-CM | POA: Diagnosis not present

## 2020-04-14 DIAGNOSIS — R059 Cough, unspecified: Secondary | ICD-10-CM

## 2020-04-14 DIAGNOSIS — L299 Pruritus, unspecified: Secondary | ICD-10-CM

## 2020-04-14 DIAGNOSIS — Z0000 Encounter for general adult medical examination without abnormal findings: Secondary | ICD-10-CM

## 2020-04-14 MED ORDER — CAMPHOR-MENTHOL 0.5-0.5 % EX LOTN: 1.0000 "application " | TOPICAL_LOTION | CUTANEOUS | 0 refills | Status: DC | PRN

## 2020-04-14 MED ORDER — ROSUVASTATIN CALCIUM 5 MG PO TABS: 5.0000 mg | ORAL_TABLET | Freq: Every day | ORAL | 0 refills | Status: DC

## 2020-04-14 MED ORDER — LEVOTHYROXINE SODIUM 25 MCG PO TABS: 25.0000 ug | ORAL_TABLET | Freq: Every day | ORAL | 0 refills | Status: DC

## 2020-04-14 NOTE — Progress Notes (Signed)
Male physical   Impression and Recommendations:    1. Healthcare maintenance   2. Subclinical hypothyroidism   3. SOB (shortness of breath)   4. Cough   5. Itchy skin   6. Screening for colon cancer   7. Need for Tdap vaccination   8. Need for shingles vaccine   9. Hyperlipidemia, unspecified hyperlipidemia type   10. Stage 3a chronic kidney disease ([place])   11. Fatigue, unspecified type      1) Anticipatory Guidance: Skin CA prevention- recommend to use sunscreen when outside along with skin surveillance; eating a balanced and modest diet; physical activity at least 25 minutes per day or minimum of 150 min/ week moderate to intense activity.  2) Immunizations / Screenings / Labs:   All immunizations are up-to-date per recommendations or will be updated today if pt allows.    - Patient understands with dental and vision screens they will schedule independently.  - Obtained CBC, CMP, HgA1c, Lipid panel, TSH and vit D when fasting, if not already done past 12 mo/ recently.  Most labs are essentially within normal limits or stable from prior.  The 10-year ASCVD risk score Ryan [name]) is: 5.9%   Values used to calculate the score:     Age: 55 years     Sex: Male     Is Non-Hispanic African American: No     Diabetic: No     Tobacco smoker: No     Systolic Blood Pressure: 725 mmHg     Is BP treated: No     HDL Cholesterol: 41 mg/dL     Total Cholesterol: 205 mg/dL -Agreeable to colonoscopy, Tdap, Shingrix, hep C and HIV screenings. -UTD on influenza and Covid vaccines.  3) Weight: Recommend to improve diet habits to improve overall feelings of well being and objective health data. Improve nutrient density of diet through increasing intake of fruits and vegetables and decreasing saturated fats, white flour products and refined sugars.  -Follow a heart healthy diet.  4) Healthcare maintenance: -Will place referral to allergy and asthma for further evaluation  of shortness of breath/cough. Patient has history of exercise-induced asthma during childhood. Reviewed recent CXR 02/08/20 which shows no active cardiopulmonary disease (lungs are clear). Recent echocardiogram shows LVEF 55-60%. -Discussed with patient management options for hyperlipidemia such as medication and potential side effects and is agreeable to starting Crestor 5 mg.  Will repeat lipid panel and hepatic function in 6 weeks. -Patient continues to have intermittent palpitations which have slightly improved the past week and TSH is mildly elevated so will start levothyroxine 25 mcg. Will repeat thyroid labs in 6 weeks. -Will send topical nonsteroidal for itchy skin. -Will place lab orders for testosterone to evaluate fatigue. -Recommend to avoid NSAIDs. Increase hydration. -Follow up in 3 months for thyroid, palpitations, HLD. Schedule lab visit in 6 weeks.    Orders Placed This Encounter  Procedures  . Varicella-zoster vaccine subcutaneous  . Tdap vaccine greater than or equal to 7yo IM  . Ambulatory referral to Allergy    Referral Priority:   Routine    Referral Type:   Allergy Testing    Referral Reason:   Specialty Services Required    Requested Specialty:   Allergy    Number of Visits Requested:   1  . Ambulatory referral to Gastroenterology    Referral Priority:   Routine    Referral Type:   Consultation    Referral Reason:  Specialty Services Required    Number of Visits Requested:   1    Meds ordered this encounter  Medications  . levothyroxine (SYNTHROID) 25 MCG tablet    Sig: Take 1 tablet (25 mcg total) by mouth daily before breakfast.    Dispense:  60 tablet    Refill:  0  . camphor-menthol (SARNA) lotion    Sig: Apply 1 application topically as needed for itching.    Dispense:  222 mL    Refill:  0  . rosuvastatin (CRESTOR) 5 MG tablet    Sig: Take 1 tablet (5 mg total) by mouth daily.    Dispense:  90 tablet    Refill:  0     Return in about 3 months  (around 07/13/2020) for Thyroid, Palpitations with FBW in 6 wks (TSH, Free T4, T3, Hep c, HIV, PSA, Testosterone,CMP,lipid).     Gross side effects, risk and benefits, and alternatives of medications discussed with patient.  Patient is aware that all medications have potential side effects and we are unable to predict every side effect or drug-drug interaction that may occur.  Expresses verbal understanding and consents to current therapy plan and treatment regimen.  Please see AVS handed out to patient at the end of our visit for further patient instructions/ counseling done pertaining to today's office visit.       Subjective:        CC: CPE   HPI: Ryan [name] is a 55 y.o. male who presents to [place] at [facility] today for a yearly health maintenance exam.     Health Maintenance Summary  - Reviewed and updated, unless pt declines services.  Last Cologuard or Colonoscopy:   Placed order for screening colonoscopy. Family history of Colon CA: N Tobacco History Reviewed:  Y, never a smoker Alcohol / drug use:    No concerns, no excessive use / no use Dental Home: Y  Male history: STD concerns:   none Additional penile/ urinary concerns: none   Additional concerns beyond Health Maintenance issues:   Productive cough that started in December.  Constantly has to be clearing his throat.  Denies headache, nasal congestion, otalgia, sinus pressure or recent illness. Has also experienced shortness of breath, especially with activity. Had recent echocardiogram which was essentially normal.   Immunization History  Administered Date(s) Administered  . Hepatitis B, ped/adol 05/14/2012, 06/18/2012  . Influenza,inj,Quad PF,6+ Mos 03/02/2019  . Influenza-Unspecified 02/25/2020  . Moderna Sars-Covid-2 Vaccination 02/25/2020  . Tdap 03/20/2009     Health Maintenance  Topic Date Due  . Hepatitis C Screening  Never done  . HIV Screening  Never done  .  COLONOSCOPY (Pts 45-82yrs Insurance coverage will need to be confirmed)  Never done  . TETANUS/TDAP  03/21/2019  . COVID-19 Vaccine (2 - Moderna 3-dose series) 03/24/2020  . INFLUENZA VACCINE  Completed       Wt Readings from Last 3 Encounters:  04/14/20 226 lb 12.8 oz (102.9 kg)  03/03/20 226 lb 4.8 oz (102.6 kg)  02/19/20 223 lb (101.2 kg)   BP Readings from Last 3 Encounters:  04/14/20 112/70  03/03/20 116/75  02/19/20 110/70   Pulse Readings from Last 3 Encounters:  04/14/20 83  03/03/20 77  02/19/20 80    Patient Active Problem List   Diagnosis Date Noted  . Tachycardia 02/17/2018  . Bronchitis 02/17/2018  . Subclinical hypothyroidism 08/06/2017  . Elevated low density lipoprotein (LDL) cholesterol level 08/06/2017  .  Obesity, Class I, BMI 30-34.9 08/06/2017  . Vitamin D insufficiency 03/05/2017  . S/P appendectomy 1985 or so 02/05/2017  . Family history of diverticulitis of colon 02/05/2017  . Family history of diabetes mellitus in maternal grandmother 02/05/2017  . Family history of heart disease in male family member before age 58 02/05/2017  . Environmental and seasonal allergies 02/05/2017  . Nephritis- 1972 03/19/1970    Past Medical History:  Diagnosis Date  . Allergy    mild  . Asthma    athletic induced asthma age 69   . Bronchitis    with viral infection nov/Dec 2019  . Deviated septum   . Nephritis 1972    Past Surgical History:  Procedure Laterality Date  . APPENDECTOMY  1984    Family History  Problem Relation Age of Onset  . Alcohol abuse Mother   . Heart attack Father   . Diabetes Maternal Grandfather   . Pancreatic cancer Maternal Aunt   . Colon cancer Neg Hx   . Colon polyps Neg Hx   . Esophageal cancer Neg Hx   . Rectal cancer Neg Hx   . Stomach cancer Neg Hx     Social History   Substance and Sexual Activity  Drug Use No  ,  Social History   Substance and Sexual Activity  Alcohol Use Yes  . Alcohol/week: 1.0  standard drink  . Types: 1 Standard drinks or equivalent per week   Comment: social  ,  Social History   Tobacco Use  Smoking Status Never Smoker  Smokeless Tobacco Never Used  ,  Social History   Substance and Sexual Activity  Sexual Activity Yes  . Birth control/protection: None    Patient's Medications  New Prescriptions   CAMPHOR-MENTHOL (SARNA) LOTION    Apply 1 application topically as needed for itching.   LEVOTHYROXINE (SYNTHROID) 25 MCG TABLET    Take 1 tablet (25 mcg total) by mouth daily before breakfast.   ROSUVASTATIN (CRESTOR) 5 MG TABLET    Take 1 tablet (5 mg total) by mouth daily.  Previous Medications   No medications on file  Modified Medications   No medications on file  Discontinued Medications   No medications on file    Patient has no known allergies.  Review of Systems: General:   Denies fever, chills, unexplained weight loss, +fatigue Optho/Auditory:   Denies visual changes, blurred vision/LOV Respiratory:   +cough, +SOB, denies pleuritic chest pain, hemoptysis  Cardiovascular:   Denies chest pain, +palpitations, +edema Gastrointestinal:   Denies nausea, vomiting, diarrhea.  Genitourinary: Denies dysuria, freq/ urgency, flank pain Endocrine:     Denies hot or cold intolerance, polyuria, polydipsia. Musculoskeletal:   Denies unexplained myalgias, joint swelling, unexplained arthralgias, gait problems.  Skin:  Denies rash, suspicious lesions Neurological:     Denies dizziness, unexplained weakness, numbness  Psychiatric/Behavioral:   Denies mood changes, suicidal or homicidal ideations, hallucinations    Objective:     Blood pressure 112/70, pulse 83, height 6' (1.829 m), weight 226 lb 12.8 oz (102.9 kg), SpO2 98 %. Body mass index is 30.76 kg/m. General Appearance:    Alert, cooperative, no distress, appears stated age  Head:    Normocephalic, without obvious abnormality, atraumatic  Eyes:    PERRL, conjunctiva/corneas clear, EOM's  intact, both eyes  Ears:    Normal TM's and external ear canals, both ears  Nose:   Nares normal, mild septal deviation, mucosa normal, no drainage    or sinus tenderness  Throat:   Lips w/o lesion, mucosa moist, and tongue normal; teeth and gums normal  Neck:   Supple, symmetrical, trachea midline, +submandibular enlargement;    thyroid:  no enlargement/tenderness/nodules  Back:     Symmetric, no curvature, ROM normal, no CVA tenderness  Lungs:     Mild expiratory wheezing noted, no crackles/rales, respirations unlabored  Chest Wall:    No tenderness or gross deformity; normal excursion   Heart:    Regular rate and rhythm, S1 and S2 normal, no murmur, rub   or gallop  Abdomen:     Soft, non-tender, bowel sounds active all four quadrants, No G/R/R, no masses, no organomegaly  Genitalia:    Deferred.   Rectal:    Placed PSA lab.  Extremities:   Extremities normal, atraumatic, no cyanosis, trace edema  Pulses:   2+ and symmetric all extremities  Skin:   Warm, dry, Skin color, texture, turgor normal, no obvious rashes or lesions  M-Sk:   Ambulates * 4 w/o difficulty, no gross deformities, tone WNL  Neurologic:   CNII-XII grossly intact, normal strength, sensation and reflexes    Throughout Psych:  No HI/SI, judgement and insight good, Euthymic mood. Full Affect.

## 2020-04-14 NOTE — Patient Instructions (Signed)

## 2020-05-05 ENCOUNTER — Ambulatory Visit (INDEPENDENT_AMBULATORY_CARE_PROVIDER_SITE_OTHER): Payer: BC Managed Care – PPO | Admitting: Allergy & Immunology

## 2020-05-05 ENCOUNTER — Encounter: Payer: Self-pay | Admitting: Allergy & Immunology

## 2020-05-05 ENCOUNTER — Other Ambulatory Visit: Payer: Self-pay

## 2020-05-05 VITALS — BP 118/70 | HR 91 | Temp 96.7°F | Resp 16 | Ht 70.2 in | Wt 226.2 lb

## 2020-05-05 DIAGNOSIS — R0982 Postnasal drip: Secondary | ICD-10-CM

## 2020-05-05 DIAGNOSIS — R0602 Shortness of breath: Secondary | ICD-10-CM | POA: Diagnosis not present

## 2020-05-05 DIAGNOSIS — J310 Chronic rhinitis: Secondary | ICD-10-CM | POA: Diagnosis not present

## 2020-05-05 DIAGNOSIS — R6889 Other general symptoms and signs: Secondary | ICD-10-CM

## 2020-05-05 DIAGNOSIS — R0989 Other specified symptoms and signs involving the circulatory and respiratory systems: Secondary | ICD-10-CM | POA: Insufficient documentation

## 2020-05-05 MED ORDER — OMEPRAZOLE 20 MG PO CPDR: 20.0000 mg | DELAYED_RELEASE_CAPSULE | Freq: Every day | ORAL | 5 refills | Status: DC

## 2020-05-05 MED ORDER — ALVESCO 160 MCG/ACT IN AERS: 2.0000 | INHALATION_SPRAY | Freq: Two times a day (BID) | RESPIRATORY_TRACT | 5 refills | Status: DC

## 2020-05-05 MED ORDER — ALBUTEROL SULFATE HFA 108 (90 BASE) MCG/ACT IN AERS: 2.0000 | INHALATION_SPRAY | RESPIRATORY_TRACT | 0 refills | Status: DC | PRN

## 2020-05-05 NOTE — Patient Instructions (Addendum)
1. SOB (shortness of breath) - I am not sure that this is asthma at all, but I think it is worth it to treat it as such for a month to see how you do. - Spacer teaching and sample provided. - We are going to start you on Alvesco (inhaled steroid) two puffs twice daily with the spacer to see if this helps. - We are also sending in a rescue medication (albuterol) to use two puffs twice daily every 4-6 hours as needed.   2. Chronic rhinitis - Testing was negative to the entire panel. - Copy of the testing provided. - Start Zyrtec (cetirizine) 10mg  daily to see if this can help.   3. Throat clearing - I think we should start a reflux medicine to see if this helps at all.  - Start omeprazole 20mg  at night.   4. Return in about 4 weeks (around 06/02/2020).    Please inform us of any Emergency Department visits, hospitalizations, or changes in symptoms. Call us before going to the ED for breathing or allergy symptoms since we might be able to fit you in for a sick visit. Feel free to contact us anytime with any questions, problems, or concerns.  It was a pleasure to meet you today!  Websites that have reliable patient information: 1. American Academy of Asthma, Allergy, and Immunology: www.aaaai.org 2. Food Allergy Research and Education (FARE): foodallergy.org 3. Mothers of Asthmatics: http://www.asthmacommunitynetwork.org 4. American College of Allergy, Asthma, and Immunology: www.acaai.org   COVID-19 Vaccine Information can be found at: ShippingScam.co.uk For questions related to vaccine distribution or appointments, please email [email or web address] or call [phone].   We realize that you might be concerned about having an allergic reaction to the COVID19 vaccines. To help with that concern, WE ARE OFFERING THE COVID19 VACCINES IN OUR OFFICE! Ask the front desk for dates!     "Like" [place] on Facebook and Instagram for our  latest updates!      A healthy democracy works best when [employer] participate! Make sure you are registered to vote! If you have moved or changed any of your contact information, you will need to get this updated before voting!  In some cases, you MAY be able to register to vote online: [email or web address]

## 2020-05-05 NOTE — Progress Notes (Signed)
NEW PATIENT  Date of Service/Encounter:  05/05/20  Referring provider: Lorrene Reid, PA-C   Assessment:   SOB (shortness of breath)  Chronic non-allergic rhinitis - consider intradermal testing in the future  Throat clearing - ? GERD  Plan/Recommendations:   1. SOB (shortness of breath) - I am not sure that this is asthma at all, but I think it is worth it to treat it as such for a month to see how you do. - Spacer teaching and sample provided. - We are going to start you on Alvesco (inhaled steroid) two puffs twice daily with the spacer to see if this helps. - We are also sending in a rescue medication (albuterol) to use two puffs twice daily every 4-6 hours as needed.   2. Chronic rhinitis - Testing was negative to the entire panel. - Copy of the testing provided. - Start Zyrtec (cetirizine) 10mg  daily to see if this can help.   3. Throat clearing - I think we should start a reflux medicine to see if this helps at all.  - Start omeprazole 20mg  at night.   4. Return in about 4 weeks (around 06/02/2020).   Subjective:   Ryan [name] is a 55 y.o. male presenting today for evaluation of  Chief Complaint  Patient presents with  . Asthma    Ryan [name] has a history of the following: Patient Active Problem List   Diagnosis Date Noted  . SOB (shortness of breath) 05/05/2020  . Non-allergic rhinitis 05/05/2020  . Throat clearing 05/05/2020  . Tachycardia 02/17/2018  . Bronchitis 02/17/2018  . Subclinical hypothyroidism 08/06/2017  . Elevated low density lipoprotein (LDL) cholesterol level 08/06/2017  . Obesity, Class I, BMI 30-34.9 08/06/2017  . Vitamin D insufficiency 03/05/2017  . S/P appendectomy 1985 or so 02/05/2017  . Family history of diverticulitis of colon 02/05/2017  . Family history of diabetes mellitus in maternal grandmother 02/05/2017  . Family history of heart disease in male family member before age 37 02/05/2017  . Environmental and  seasonal allergies 02/05/2017  . Nephritis- 1972 03/19/1970    History obtained from: chart review and patient.  Ryan [name] was referred by Lorrene Reid, PA-C.     Ryan [name] is a 55 y.o. male presenting for an evaluation of shortness of breath.   Asthma/Respiratory Symptom History: He has not diagnosis of asthma. He started having issues in 2019 with breathing difficulties. He was breathing what he thought was fine, but he felt that he was smothering. He went to see his PCP. He was given Dimetapp. He was not coughing really but he has a tendency to clear his throat. He was not having acute coughing. The Dimetapp was taken for 2-3 days in total. Then last winter 2020-2021, he had no issues at all. Then this year it got worse. He had a situation where he would go to the top of the stairs and have difficulty breathing, almost like a panic attack. This situation was happening to the point where he had to be careful how fast he walked around. He denies coughing. He was never given prednisone and has never taken OTC medications. He did grab the Dimetapp again and this seemed to help. He is now taking that more regularly.   Interestingly, he does report that his symptoms worsen when he goes to see family members in the [place].  In retrospect, he remembers that his breathing was a little heavier when he lived in the [place], specifically in  the [place] area.  He felt a lot better after moving to [place] and then finally to this area.  He does have a history of heart issues. He saw a Film/video editor and was told that he was fine. He has no smoking history aside from once when he was 55 years old.   He has never been on reflux medication. He never feels any heart burn. He denies having this at all. He has not tried using any reflux medications to treat this.  He does have a history of a right deviated septum.  He does not see an otolaryngologist.  It does not seem to bother him at  all.  Otherwise, there is no history of other atopic diseases, including food allergies, drug allergies, stinging insect allergies, eczema, urticaria or contact dermatitis. There is no significant infectious history. Vaccinations are up to date.    Past Medical History: Patient Active Problem List   Diagnosis Date Noted  . SOB (shortness of breath) 05/05/2020  . Non-allergic rhinitis 05/05/2020  . Throat clearing 05/05/2020  . Tachycardia 02/17/2018  . Bronchitis 02/17/2018  . Subclinical hypothyroidism 08/06/2017  . Elevated low density lipoprotein (LDL) cholesterol level 08/06/2017  . Obesity, Class I, BMI 30-34.9 08/06/2017  . Vitamin D insufficiency 03/05/2017  . S/P appendectomy 1985 or so 02/05/2017  . Family history of diverticulitis of colon 02/05/2017  . Family history of diabetes mellitus in maternal grandmother 02/05/2017  . Family history of heart disease in male family member before age 67 02/05/2017  . Environmental and seasonal allergies 02/05/2017  . Nephritis- 1972 03/19/1970    Medication List:  Allergies as of 05/05/2020   No Known Allergies     Medication List       Accurate as of May 05, 2020 11:05 AM. If you have any questions, ask your nurse or doctor.        STOP taking these medications   camphor-menthol lotion Commonly known as: SARNA Stopped by: Valentina Shaggy, MD     TAKE these medications   albuterol 108 (90 Base) MCG/ACT inhaler Commonly known as: VENTOLIN HFA Inhale 2 puffs into the lungs every 4 (four) hours as needed for wheezing or shortness of breath. Started by: Valentina Shaggy, MD   Alvesco 160 MCG/ACT inhaler Generic drug: ciclesonide Inhale 2 puffs into the lungs 2 (two) times daily. Started by: Valentina Shaggy, MD   levothyroxine 25 MCG tablet Commonly known as: SYNTHROID Take 1 tablet (25 mcg total) by mouth daily before breakfast.   omeprazole 20 MG capsule Commonly known as: PRILOSEC Take 1  capsule (20 mg total) by mouth daily. Started by: Valentina Shaggy, MD   rosuvastatin 5 MG tablet Commonly known as: Crestor Take 1 tablet (5 mg total) by mouth daily.       Birth History: non-contributory  Developmental History: non-contributory  Past Surgical History: Past Surgical History:  Procedure Laterality Date  . APPENDECTOMY  1984     Family History: Family History  Problem Relation Age of Onset  . Alcohol abuse Mother   . Heart attack Father   . Diabetes Maternal Grandfather   . Pancreatic cancer Maternal Aunt   . Colon cancer Neg Hx   . Colon polyps Neg Hx   . Esophageal cancer Neg Hx   . Rectal cancer Neg Hx   . Stomach cancer Neg Hx   . Allergic rhinitis Neg Hx   . Angioedema Neg Hx   . Asthma Neg Hx   .  Atopy Neg Hx   . Eczema Neg Hx   . Immunodeficiency Neg Hx   . Urticaria Neg Hx      Social History: Bela lives at home with his family.  He lives in a house that was built in 1978.  There is carpeting throughout the home.  There is gas heating and central cooling.  There is one indoor dog.  There are no dust mite covers on the bedding.  There is no tobacco exposure. 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   Review of Systems  Constitutional: Negative.  Negative for fever, malaise/fatigue and weight loss.  HENT: Negative.  Negative for congestion, ear discharge and ear pain.   Eyes: Negative for pain, discharge and redness.  Respiratory: Positive for cough and shortness of breath. Negative for sputum production and wheezing.   Cardiovascular: Negative.  Negative for chest pain and palpitations.  Gastrointestinal: Negative for abdominal pain, constipation, diarrhea, heartburn, nausea and vomiting.  Skin: Negative.  Negative for itching and rash.  Neurological: Negative for dizziness and headaches.  Endo/Heme/Allergies: Negative for environmental allergies. Does not bruise/bleed easily.       Objective:    Blood pressure 118/70, pulse 91, temperature (!) 96.7 F (35.9 C), temperature source Tympanic, resp. rate 16, height 5' 10.2" (1.783 m), weight 226 lb 3.2 oz (102.6 kg), SpO2 98 %. Body mass index is 32.27 kg/m.   Physical Exam:   Physical Exam Constitutional:      Appearance: He is well-developed.  HENT:     Head: Normocephalic and atraumatic.     Right Ear: Tympanic membrane, ear canal and external ear normal. No drainage, swelling or tenderness. Tympanic membrane is not injected, scarred, erythematous, retracted or bulging.     Left Ear: Tympanic membrane, ear canal and external ear normal. No drainage, swelling or tenderness. Tympanic membrane is not injected, scarred, erythematous, retracted or bulging.     Nose: No nasal deformity, septal deviation, mucosal edema, rhinorrhea or epistaxis.     Right Sinus: No maxillary sinus tenderness or frontal sinus tenderness.     Left Sinus: No maxillary sinus tenderness or frontal sinus tenderness.     Mouth/Throat:     Mouth: Oropharynx is clear and moist. Mucous membranes are not pale and not dry.     Pharynx: Uvula midline.  Eyes:     General:        Right eye: No discharge.        Left eye: No discharge.     Extraocular Movements: EOM normal.     Conjunctiva/sclera: Conjunctivae normal.     Right eye: Right conjunctiva is not injected. No chemosis.    Left eye: Left conjunctiva is not injected. No chemosis.    Pupils: Pupils are equal, round, and reactive to light.  Cardiovascular:     Rate and Rhythm: Normal rate and regular rhythm.     Heart sounds: Normal heart sounds.  Pulmonary:     Effort: Pulmonary effort is normal. No tachypnea, accessory muscle usage or respiratory distress.     Breath sounds: Normal breath sounds. No wheezing, rhonchi or rales.  Chest:     Chest wall: No tenderness.  Abdominal:     Tenderness: There is no abdominal tenderness. There is no guarding or rebound.  Lymphadenopathy:     Head:      Right side of head: No submandibular, tonsillar or occipital adenopathy.     Left side of head: No submandibular,  tonsillar or occipital adenopathy.     Cervical: No cervical adenopathy.  Skin:    Coloration: Skin is not pale.     Findings: No abrasion, erythema, petechiae or rash. Rash is not papular, urticarial or vesicular.  Neurological:     Mental Status: He is alert.  Psychiatric:        Mood and Affect: Mood and affect normal.        Behavior: Behavior is cooperative.      Diagnostic studies:    Spirometry: results abnormal (FEV1: 3.66/76%, FVC: 4.38/69%, FEV1/FVC: 84%).    Spirometry consistent with possible restrictive disease. Xopenex 4 puffs given in clinic with no improvement.  Allergy Studies:     Airborne Adult Perc - 05/05/20 0936    Time Antigen Placed 0930    Allergen Manufacturer Lavella Hammock    Location Back    Number of Test 59    1. Control-Buffer 50% Glycerol Negative    2. Control-Histamine 1 mg/ml 2+    3. Albumin saline Negative    4. [place] Negative    5. [place] Negative    6. Johnson Negative    7. Ridgeway Blue Negative    8. Meadow Fescue Negative    9. Perennial Rye Negative    10. Sweet Vernal Negative    11. Timothy Negative    12. Cocklebur Negative    13. Burweed Marshelder Negative    14. Ragweed, short Negative    15. Ragweed, Giant Negative    16. Plantain,  English Negative    17. Lamb's Quarters Negative    18. Sheep Sorrell Negative    19. Rough Pigweed Negative    20. Marsh Elder, Rough Negative    21. Mugwort, Common Negative    22. Ash mix Negative    23. Birch mix Negative    24. Beech American Negative    25. Box, Elder Negative    26. Cedar, red Negative    27. Cottonwood, [place] Negative    28. Elm mix Negative    29. Hickory Negative    30. Maple mix Negative    31. Oak, [place] mix Negative    32. Pecan Pollen Negative    33. Pine mix Negative    34. Sycamore Eastern Negative    35. [place], Black Pollen Negative     36. Alternaria alternata Negative    37. Cladosporium Herbarum Negative    38. Aspergillus mix Negative    39. Penicillium mix Negative    40. Bipolaris sorokiniana (Helminthosporium) Negative    41. Drechslera spicifera (Curvularia) Negative    42. Mucor plumbeus Negative    43. Fusarium moniliforme Negative    44. Aureobasidium pullulans (pullulara) Negative    45. Rhizopus oryzae Negative    46. Botrytis cinera Negative    47. Epicoccum nigrum Negative    48. Phoma betae Negative    49. Candida Albicans Negative    50. Trichophyton mentagrophytes Negative    51. Mite, D Farinae  5,000 AU/ml Negative    52. Mite, D Pteronyssinus  5,000 AU/ml Negative    53. Cat Hair 10,000 BAU/ml Negative    54.  Dog Epithelia Negative    55. Mixed Feathers Negative    56. Horse Epithelia Negative    57. Cockroach, German Negative    58. Mouse Negative    59. Tobacco Leaf Negative           Allergy testing results were read and interpreted by myself, documented  by clinical staff.         Salvatore Marvel, MD [facility]

## 2020-05-20 ENCOUNTER — Other Ambulatory Visit: Payer: Self-pay | Admitting: Physician Assistant

## 2020-05-20 DIAGNOSIS — R5383 Other fatigue: Secondary | ICD-10-CM

## 2020-05-20 DIAGNOSIS — E038 Other specified hypothyroidism: Secondary | ICD-10-CM

## 2020-05-20 DIAGNOSIS — Z5320 Procedure and treatment not carried out because of patient's decision for unspecified reasons: Secondary | ICD-10-CM

## 2020-05-20 DIAGNOSIS — Z1159 Encounter for screening for other viral diseases: Secondary | ICD-10-CM

## 2020-05-20 DIAGNOSIS — Z114 Encounter for screening for human immunodeficiency virus [HIV]: Secondary | ICD-10-CM

## 2020-05-20 DIAGNOSIS — Z0000 Encounter for general adult medical examination without abnormal findings: Secondary | ICD-10-CM

## 2020-05-20 DIAGNOSIS — Z125 Encounter for screening for malignant neoplasm of prostate: Secondary | ICD-10-CM

## 2020-05-27 ENCOUNTER — Other Ambulatory Visit: Payer: Self-pay

## 2020-05-27 ENCOUNTER — Other Ambulatory Visit: Payer: BC Managed Care – PPO

## 2020-05-27 DIAGNOSIS — E038 Other specified hypothyroidism: Secondary | ICD-10-CM

## 2020-05-27 DIAGNOSIS — Z0000 Encounter for general adult medical examination without abnormal findings: Secondary | ICD-10-CM

## 2020-05-27 DIAGNOSIS — Z114 Encounter for screening for human immunodeficiency virus [HIV]: Secondary | ICD-10-CM

## 2020-05-27 DIAGNOSIS — Z125 Encounter for screening for malignant neoplasm of prostate: Secondary | ICD-10-CM

## 2020-05-27 DIAGNOSIS — Z1159 Encounter for screening for other viral diseases: Secondary | ICD-10-CM

## 2020-05-27 DIAGNOSIS — R5383 Other fatigue: Secondary | ICD-10-CM

## 2020-05-28 LAB — COMPREHENSIVE METABOLIC PANEL
ALT: 32 IU/L (ref 0–44)
AST: 19 IU/L (ref 0–40)
Albumin/Globulin Ratio: 1.8 (ref 1.2–2.2)
Albumin: 4.2 g/dL (ref 3.8–4.9)
Alkaline Phosphatase: 58 IU/L (ref 44–121)
BUN/Creatinine Ratio: 16 (ref 9–20)
BUN: 20 mg/dL (ref 6–24)
Bilirubin Total: 0.4 mg/dL (ref 0.0–1.2)
CO2: 21 mmol/L (ref 20–29)
Calcium: 9.1 mg/dL (ref 8.7–10.2)
Chloride: 104 mmol/L (ref 96–106)
Creatinine, Ser: 1.24 mg/dL (ref 0.76–1.27)
Globulin, Total: 2.4 g/dL (ref 1.5–4.5)
Glucose: 89 mg/dL (ref 65–99)
Potassium: 4.4 mmol/L (ref 3.5–5.2)
Sodium: 140 mmol/L (ref 134–144)
Total Protein: 6.6 g/dL (ref 6.0–8.5)
eGFR: 68 mL/min/{1.73_m2} (ref >59)

## 2020-05-28 LAB — LIPID PANEL
Chol/HDL Ratio: 2.9 ratio (ref 0.0–5.0)
Cholesterol, Total: 144 mg/dL (ref 100–199)
HDL: 49 mg/dL (ref >39)
LDL Chol Calc (NIH): 83 mg/dL (ref 0–99)
Triglycerides: 56 mg/dL (ref 0–149)
VLDL Cholesterol Cal: 12 mg/dL (ref 5–40)

## 2020-05-28 LAB — HIV ANTIBODY (ROUTINE TESTING W REFLEX): HIV Screen 4th Generation wRfx: NONREACTIVE

## 2020-05-28 LAB — HEPATITIS C ANTIBODY: Hep C Virus Ab: <0.1 s/co ratio (ref 0.0–0.9)

## 2020-05-28 LAB — PSA: Prostate Specific Ag, Serum: 0.8 ng/mL (ref 0.0–4.0)

## 2020-05-28 LAB — TSH: TSH: 3.39 u[IU]/mL (ref 0.450–4.500)

## 2020-05-28 LAB — T4, FREE: Free T4: 1.07 ng/dL (ref 0.82–1.77)

## 2020-05-28 LAB — T3: T3, Total: 101 ng/dL (ref 71–180)

## 2020-05-28 LAB — TESTOSTERONE: Testosterone: 451 ng/dL (ref 264–916)

## 2020-05-30 ENCOUNTER — Other Ambulatory Visit: Payer: Self-pay | Admitting: Physician Assistant

## 2020-05-30 DIAGNOSIS — E038 Other specified hypothyroidism: Secondary | ICD-10-CM

## 2020-05-30 MED ORDER — LEVOTHYROXINE SODIUM 25 MCG PO TABS: 25.0000 ug | ORAL_TABLET | Freq: Every day | ORAL | 0 refills | Status: DC

## 2020-06-02 ENCOUNTER — Ambulatory Visit: Payer: BC Managed Care – PPO | Admitting: Allergy & Immunology

## 2020-06-02 ENCOUNTER — Telehealth: Payer: Self-pay | Admitting: Allergy & Immunology

## 2020-06-02 ENCOUNTER — Encounter: Payer: Self-pay | Admitting: Allergy & Immunology

## 2020-06-02 ENCOUNTER — Other Ambulatory Visit: Payer: Self-pay

## 2020-06-02 VITALS — BP 112/62 | HR 78 | Temp 98.1°F | Resp 20

## 2020-06-02 DIAGNOSIS — J310 Chronic rhinitis: Secondary | ICD-10-CM

## 2020-06-02 DIAGNOSIS — J342 Deviated nasal septum: Secondary | ICD-10-CM | POA: Diagnosis not present

## 2020-06-02 DIAGNOSIS — J4530 Mild persistent asthma, uncomplicated: Secondary | ICD-10-CM

## 2020-06-02 NOTE — Patient Instructions (Addendum)
1. Mild persistent asthma, uncomplicated - Ryan [name] looks better today. - It looks like this asthma diagnosis is it. - Daily controller medication(s): Alvesco 160mg  two puffs twice daily - Prior to physical activity: albuterol 2 puffs 10-15 minutes before physical activity. - Rescue medications: albuterol 4 puffs every 4-6 hours as needed - Changes during respiratory infections or worsening symptoms: Increase Alvesco to 4 puffs twice daily for TWO WEEKS. - Asthma control goals:  * Full participation in all desired activities (may need albuterol before activity) * Albuterol use two time or less a week on average (not counting use with activity) * Cough interfering with sleep two time or less a month * Oral steroids no more than once a year * No hospitalizations  2. Non-allergic rhinitis - Continue with cetirizine 10mg  as needed.  3. Return in about 6 months (around 12/03/2020).    Please inform us of any Emergency Department visits, hospitalizations, or changes in symptoms. Call us before going to the ED for breathing or allergy symptoms since we might be able to fit you in for a sick visit. Feel free to contact us anytime with any questions, problems, or concerns.  It was a pleasure to see you again today!  Websites that have reliable patient information: 1. American Academy of Asthma, Allergy, and Immunology: www.aaaai.org 2. Food Allergy Research and Education (FARE): foodallergy.org 3. Mothers of Asthmatics: http://www.asthmacommunitynetwork.org 4. American College of Allergy, Asthma, and Immunology: www.acaai.org   COVID-19 Vaccine Information can be found at: [email or web address].[email or web address].uk For questions related to vaccine distribution or appointments, please email [email or web address] or call [phone].   We realize that you might be concerned about having an allergic reaction to the COVID19 vaccines. To help with that concern, WE  ARE OFFERING THE COVID19 VACCINES IN OUR OFFICE! Ask the front desk for dates!     "Like" [place] on Facebook and Instagram for our latest updates!      A healthy democracy works best when [employer] participate! Make sure you are registered to vote! If you have moved or changed any of your contact information, you will need to get this updated before voting!  In some cases, you MAY be able to register to vote online: [email or web address]

## 2020-06-02 NOTE — Telephone Encounter (Signed)
Referral faxed to Dr. Benjamine Mola at [phone] along with notes and demographics. Informed patient that Dr. Deeann Saint office would reach out to schedule.

## 2020-06-02 NOTE — Addendum Note (Signed)
Addended by: Valentina Shaggy on: 06/02/2020 10:47 AM   Modules accepted: Orders

## 2020-06-02 NOTE — Progress Notes (Addendum)
FOLLOW UP  Date of Service/Encounter:  06/02/20   Assessment:   Mild persistent asthma, uncomplicated  Chronic non-allergic rhinitis - consider intradermal testing in the future   Jet presents for follow-up visit.  He is doing much better on the Alvesco twice daily.  This clearly has helped his shortness of breath and he does report increased energy.  I think it was reasonable to not do the omeprazole if there was no improvement.  The same thing with his Zyrtec.  Obviously I would not want him taking something that is not providing any relief.  We will see him again in 6 months and consider stepping down his therapy to the least effective dose for the most effective clinical improvement.   Plan/Recommendations:   1. Mild persistent asthma, uncomplicated - Arlyce Harman looks better today. - It looks like this asthma diagnosis is it. - Daily controller medication(s): Alvesco 160mg  two puffs twice daily - Prior to physical activity: albuterol 2 puffs 10-15 minutes before physical activity. - Rescue medications: albuterol 4 puffs every 4-6 hours as needed - Changes during respiratory infections or worsening symptoms: Increase Alvesco to 4 puffs twice daily for TWO WEEKS. - Asthma control goals:  * Full participation in all desired activities (may need albuterol before activity) * Albuterol use two time or less a week on average (not counting use with activity) * Cough interfering with sleep two time or less a month * Oral steroids no more than once a year * No hospitalizations  2. Non-allergic rhinitis - Continue with cetirizine 10mg  as needed.  3. Return in about 6 months (around 12/03/2020).   Subjective:   Ryan [name] is a 55 y.o. male presenting today for follow up of  Chief Complaint  Patient presents with  . Breathing Problem    SOB is getting better    Ryan [name] has a history of the following: Patient Active Problem List   Diagnosis Date Noted  . SOB  (shortness of breath) 05/05/2020  . Non-allergic rhinitis 05/05/2020  . Throat clearing 05/05/2020  . Tachycardia 02/17/2018  . Bronchitis 02/17/2018  . Subclinical hypothyroidism 08/06/2017  . Elevated low density lipoprotein (LDL) cholesterol level 08/06/2017  . Obesity, Class I, BMI 30-34.9 08/06/2017  . Vitamin D insufficiency 03/05/2017  . S/P appendectomy 1985 or so 02/05/2017  . Family history of diverticulitis of colon 02/05/2017  . Family history of diabetes mellitus in maternal grandmother 02/05/2017  . Family history of heart disease in male family member before age 70 02/05/2017  . Environmental and seasonal allergies 02/05/2017  . Nephritis- 1972 03/19/1970    History obtained from: chart review and patient.  Ryan [name] is a 55 y.o. male presenting for a follow up visit.  He was last seen in February 2022 as a new patient.  At that time, he was endorsing shortness of breath.  His spirometry was normal.  We started him on Alvesco 2 puffs twice daily to see if it helps with his shortness of breath.  We also sent in a prescription for albuterol.  He was negative to the entire environmental panel.  We started Zyrtec 10 mg daily.  We also started omeprazole due to throat clearing.  Since last visit, he has done well. He did take the omeprazole for a week or so and did not notice any improvement.  He did not use the Zyrtec on a regular basis.  However, he felt very good with the Alvesco.    Asthma/Respiratory Symptom History:  He has been using the Alvesco two puffs twice daily and he continued to improve. He was able to climb stairs without SOB. He climbed stairs and was able to go up quickly. He was not breathing heavy at all.  He did this the next day and he noticed that he could go even quicker. ACT score is 25, indicating excellent asthma control.  He has not been to the emergency room.  He denies any nighttime symptoms.  He has been able to breathe quite a lot better.  Allergic  Rhinitis Symptom History: He is not using the Zyrtec every day.  He wanted to figure out which one worked before adding all of the them on together.   Otherwise, there have been no changes to his past medical history, surgical history, family history, or social history.    Review of Systems  Constitutional: Negative.  Negative for chills, fever, malaise/fatigue and weight loss.  HENT: Negative.  Negative for congestion, ear discharge, ear pain and sore throat.   Eyes: Negative for pain, discharge and redness.  Respiratory: Negative for cough, sputum production, shortness of breath and wheezing.   Cardiovascular: Negative.  Negative for chest pain and palpitations.  Gastrointestinal: Negative for abdominal pain, constipation, diarrhea, heartburn, nausea and vomiting.  Skin: Negative.  Negative for itching and rash.  Neurological: Negative for dizziness and headaches.  Endo/Heme/Allergies: Negative for environmental allergies. Does not bruise/bleed easily.       Objective:   Blood pressure 112/62, pulse 78, temperature 98.1 F (36.7 C), temperature source Temporal, resp. rate 20, SpO2 97 %. There is no height or weight on file to calculate BMI.   Physical Exam:  Physical Exam Constitutional:      Appearance: He is well-developed.     Comments: Friendly. Talkative.  HENT:     Head: Normocephalic and atraumatic. No raccoon eyes.     Right Ear: Tympanic membrane, ear canal and external ear normal.     Left Ear: Tympanic membrane, ear canal and external ear normal.     Nose: Septal deviation present. No nasal deformity, mucosal edema or rhinorrhea.     Right Turbinates: Enlarged, swollen and pale.     Left Turbinates: Enlarged, swollen and pale.     Right Sinus: No maxillary sinus tenderness or frontal sinus tenderness.     Left Sinus: No maxillary sinus tenderness or frontal sinus tenderness.     Mouth/Throat:     Lips: Pink.     Mouth: Mucous membranes are moist. Mucous  membranes are not pale and not dry.     Pharynx: Uvula midline.     Comments: Cobblestoning present in the posterior oropharynx.  Eyes:     General:        Right eye: No discharge.        Left eye: No discharge.     Conjunctiva/sclera: Conjunctivae normal.     Right eye: Right conjunctiva is not injected. No chemosis.    Left eye: Left conjunctiva is not injected. No chemosis.    Pupils: Pupils are equal, round, and reactive to light.  Cardiovascular:     Rate and Rhythm: Normal rate and regular rhythm.     Heart sounds: Normal heart sounds.  Pulmonary:     Effort: Pulmonary effort is normal. No tachypnea, accessory muscle usage or respiratory distress.     Breath sounds: Normal breath sounds. No wheezing, rhonchi or rales.     Comments: Moving air well in all lung fields. Chest:  Chest wall: No tenderness.  Lymphadenopathy:     Cervical: No cervical adenopathy.  Skin:    Coloration: Skin is not pale.     Findings: No abrasion, erythema, petechiae or rash. Rash is not papular, urticarial or vesicular.  Neurological:     Mental Status: He is alert.  Psychiatric:        Behavior: Behavior is cooperative.      Diagnostic studies:    Spirometry: results normal (FEV1: 3.75/78%, FVC: 4.56/72%, FEV1/FVC: 82%).    Spirometry consistent with possible restrictive disease.  Overall, values are much better than they were before.  Allergy Studies: none        Salvatore Marvel, MD  [facility] and [facility]

## 2020-06-02 NOTE — Telephone Encounter (Signed)
-----   Message from Valentina Shaggy, MD sent at 06/02/2020 10:46 AM EDT ----- ENT referral placed.

## 2020-06-07 ENCOUNTER — Other Ambulatory Visit: Payer: Self-pay | Admitting: Physician Assistant

## 2020-06-07 DIAGNOSIS — E038 Other specified hypothyroidism: Secondary | ICD-10-CM

## 2020-07-08 ENCOUNTER — Telehealth: Payer: Self-pay | Admitting: Family

## 2020-07-08 MED ORDER — ALVESCO 160 MCG/ACT IN AERS: 2.0000 | INHALATION_SPRAY | Freq: Two times a day (BID) | RESPIRATORY_TRACT | 5 refills | Status: DC

## 2020-07-08 NOTE — Telephone Encounter (Signed)
Pt request refill for alvesco, pt states [facility] is giving him a difficult time with getting this prescription and would like confirmation by phone call when meds have been sent to the pharmacy.

## 2020-07-08 NOTE — Telephone Encounter (Signed)
Called [facility] and unable to get through to the pharmacy but I did resend RX.

## 2020-07-11 NOTE — Telephone Encounter (Signed)
Received a fax from Dr Deeann Saint office. They tried to contact the patient multiple time to get a new patient appointment scheduled & their office has closed out this referral.  I called and left a detailed voicemail for the patient to see if he is interested in being seen with their office. Im going to go ahead and close the referral on our end. If the patient calls back, we can open it back up and send it back to Dr Benjamine Mola.

## 2020-07-12 ENCOUNTER — Other Ambulatory Visit: Payer: Self-pay | Admitting: Physician Assistant

## 2020-07-12 ENCOUNTER — Other Ambulatory Visit: Payer: Self-pay

## 2020-07-12 DIAGNOSIS — E785 Hyperlipidemia, unspecified: Secondary | ICD-10-CM

## 2020-07-12 MED ORDER — ALVESCO 160 MCG/ACT IN AERS: 2.0000 | INHALATION_SPRAY | Freq: Two times a day (BID) | RESPIRATORY_TRACT | 5 refills | Status: DC

## 2020-07-15 ENCOUNTER — Encounter: Payer: Self-pay | Admitting: Physician Assistant

## 2020-07-15 ENCOUNTER — Ambulatory Visit: Payer: BC Managed Care – PPO | Admitting: Physician Assistant

## 2020-07-15 VITALS — Ht 71.5 in | Wt 226.0 lb

## 2020-07-15 DIAGNOSIS — E785 Hyperlipidemia, unspecified: Secondary | ICD-10-CM | POA: Diagnosis not present

## 2020-07-15 DIAGNOSIS — J4530 Mild persistent asthma, uncomplicated: Secondary | ICD-10-CM | POA: Diagnosis not present

## 2020-07-15 DIAGNOSIS — Z1211 Encounter for screening for malignant neoplasm of colon: Secondary | ICD-10-CM

## 2020-07-15 DIAGNOSIS — E038 Other specified hypothyroidism: Secondary | ICD-10-CM | POA: Diagnosis not present

## 2020-07-15 MED ORDER — ROSUVASTATIN CALCIUM 5 MG PO TABS: 5.0000 mg | ORAL_TABLET | Freq: Every day | ORAL | 1 refills | Status: DC

## 2020-07-15 NOTE — Patient Instructions (Signed)

## 2020-07-15 NOTE — Progress Notes (Signed)
Telehealth office visit note for Ryan Reid, PA-C- at Primary Care at [facility]   I connected with current patient today by telephone and verified that I am speaking with the correct person   . Location of the patient: Home . Location of the provider: Office (home) - This visit type was conducted due to national recommendations for restrictions regarding the COVID-19 Pandemic (e.g. social distancing) in an effort to limit this patient's exposure and mitigate transmission in our community.    - No physical exam could be performed with this format, beyond that communicated to [place] by the patient/ family members as noted.   - Additionally my office staff/ schedulers were to discuss with the patient that there may be a monetary charge related to this service, depending on their medical insurance.  My understanding is that patient understood and consented to proceed.     _________________________________________________________________________________   History of Present Illness: Patient calls in to follow up on hypothyroidism and hyperlipidemia. Has not acute concerns today.  Hypothyroidism: Reports since starting thyroid medication symptoms of fatigue and palpitations have improved. Tolerating medication without issues.   HLD: Pt was started on rosuvastatin last office visit and is taking medication as directed without issues. Denies side effects including myalgias and RUQ pain. Reports is more active with doing more yard work.   Asthma: Patient reports symptoms (SOB) are controlled with daily inhaler (Alvesco). States has not needed to use Albuterol. Was started on omeprazole for throat clearing sensation to evaluate if GERD related and states medication did not make a difference so was discontinued.      No flowsheet data found.  Depression screen [facility] 2/9 07/15/2020 04/14/2020 03/03/2020 05/01/2018 02/17/2018  Decreased Interest 0 0 0 0 2  Down, Depressed, Hopeless 0 0 0 0 0   PHQ - 2 Score 0 0 0 0 2  Altered sleeping 0 0 0 0 1  Tired, decreased energy 0 0 0 0 2  Change in appetite 0 0 0 0 2  Feeling bad or failure about yourself  0 0 0 0 0  Trouble concentrating 0 0 0 0 0  Moving slowly or fidgety/restless 0 0 0 0 0  Suicidal thoughts 0 0 0 0 0  PHQ-9 Score 0 0 0 0 7  Difficult doing work/chores Not difficult at all - - Not difficult at all Somewhat difficult      Impression and Recommendations:     1. Subclinical hypothyroidism   2. Screening for colon cancer   3. Hyperlipidemia, unspecified hyperlipidemia type   4. Mild persistent asthma without complication     Subclinical hypothyroidism: -Last TSH 3.390, improved from 6.500 and patient reports improvement with symptoms so will continue current medication regimen. -Will continue to monitor and repeat thyroid labs at follow up visit.  Hyperlipidemia, unspecified hyperlipidemia type: -Last lipid panel: total cholesterol 144 (improved from 205), triglycerides 56, HDL 49, LDL 83 (improved from 143) -Continue current medication regimen. Last hepatic function normal. -Will continue to monitor.  Mild persistent asthma without complication: -Improved and controlled. -Followed by Allergy and Asthma. -On albuterol and ciclesonide inhaler.   - As part of my medical decision making, I reviewed the following data within the [place] History obtained from pt /family, CMA notes reviewed and incorporated if applicable, Labs reviewed, Radiograph/ tests reviewed if applicable and OV notes from prior OV's with me, as well as any other specialists she/he has seen since seeing me  last, were all reviewed and used in my medical decision making process today.    - Additionally, when appropriate, discussion had with patient regarding our treatment plan, and their biases/concerns about that plan were used in my medical decision making today.    - The patient agreed with the plan and demonstrated an  understanding of the instructions.   No barriers to understanding were identified.     - The patient was advised to call back or seek an in-person evaluation if the symptoms worsen or if the condition fails to improve as anticipated.   Return in about 6 months (around 01/14/2021) for HLD, thyroid and FBW (lipid panel, cmp, tsh, free t4).    Orders Placed This Encounter  Procedures  . Cologuard    Meds ordered this encounter  Medications  . rosuvastatin (CRESTOR) 5 MG tablet    Sig: Take 1 tablet (5 mg total) by mouth daily.    Dispense:  90 tablet    Refill:  1    Medications Discontinued During This Encounter  Medication Reason  . rosuvastatin (CRESTOR) 5 MG tablet Reorder       Time spent on telephone encounter was 6 minutes.      The [place] was signed into law in 2016 which includes the topic of electronic health records.  This provides immediate access to information in MyChart.  This includes consultation notes, operative notes, office notes, lab results and pathology reports.  If you have any questions about what you read please let us know at your next visit or call us at the office.  We are right here with you.   __________________________________________________________________________________     Patient Care Team    Relationship Specialty Notifications Start End  Ryan [name], [place] PCP - General   07/19/19   Vicenta Aly, [place] Nurse Practitioner Nurse Practitioner  02/05/17      -Vitals obtained; medications/ allergies reconciled;  personal medical, social, Sx etc.histories were updated by CMA, reviewed by me and are reflected in chart   Patient Active Problem List   Diagnosis Date Noted  . SOB (shortness of breath) 05/05/2020  . Non-allergic rhinitis 05/05/2020  . Throat clearing 05/05/2020  . Tachycardia 02/17/2018  . Bronchitis 02/17/2018  . Subclinical hypothyroidism 08/06/2017  . Elevated low density lipoprotein (LDL)  cholesterol level 08/06/2017  . Obesity, Class I, BMI 30-34.9 08/06/2017  . Vitamin D insufficiency 03/05/2017  . S/P appendectomy 1985 or so 02/05/2017  . Family history of diverticulitis of colon 02/05/2017  . Family history of diabetes mellitus in maternal grandmother 02/05/2017  . Family history of heart disease in male family member before age 26 02/05/2017  . Environmental and seasonal allergies 02/05/2017  . Nephritis- 1972 03/19/1970     Current Meds  Medication Sig  . albuterol (VENTOLIN HFA) 108 (90 Base) MCG/ACT inhaler Inhale 2 puffs into the lungs every 4 (four) hours as needed for wheezing or shortness of breath.  . ciclesonide (ALVESCO) 160 MCG/ACT inhaler Inhale 2 puffs into the lungs 2 (two) times daily.  Marland Kitchen levothyroxine (SYNTHROID) 25 MCG tablet Take 1 tablet (25 mcg total) by mouth daily before breakfast.  . [DISCONTINUED] rosuvastatin (CRESTOR) 5 MG tablet TAKE 1 TABLET (5 MG TOTAL) BY MOUTH DAILY.     Allergies:  No Known Allergies   ROS:  See above HPI for pertinent positives and negatives   Objective:   Height 5' 11.5" (1.816 m), weight 226 lb (102.5 kg).  (if some vitals  are omitted, this means that patient was UNABLE to obtain them even. ) General: A & O * 3; sounds in no acute distress; in usual state of health.  Respiratory: speaking in full sentences, no conversational dyspnea Psych: insight appears good, mood- appears full

## 2020-08-05 ENCOUNTER — Ambulatory Visit: Payer: BC Managed Care – PPO | Attending: Critical Care Medicine

## 2020-08-05 ENCOUNTER — Ambulatory Visit: Payer: Self-pay

## 2020-08-05 DIAGNOSIS — Z20822 Contact with and (suspected) exposure to covid-19: Secondary | ICD-10-CM

## 2020-08-06 LAB — NOVEL CORONAVIRUS, NAA: SARS-CoV-2, NAA: DETECTED — AB

## 2020-08-06 LAB — SARS-COV-2, NAA 2 DAY TAT

## 2020-08-07 ENCOUNTER — Telehealth: Payer: Self-pay | Admitting: Nurse Practitioner

## 2020-08-07 NOTE — Telephone Encounter (Signed)
Called to discuss with patient about COVID-19 symptoms and the use of one of the available treatments for those with mild to moderate Covid symptoms and at a high risk of hospitalization.  Pt appears to qualify for outpatient treatment due to co-morbid conditions and/or a member of an at-risk group in accordance with the FDA Emergency Use Authorization.    Symptom onset: unknown Vaccinated: only 1 dose on file (moderna) Booster? Not on file Immunocompromised? no Qualifiers: obesity NIH Criteria: 4  Unable to reach pt - Voicemail and Mychart message sent.   Recent labs from 05/27/20 shows GFR >60. Patient would be candidate for Paxlovid.   Alda Lea, NP [place] Treatment Team [phone]

## 2020-08-08 NOTE — Addendum Note (Signed)
Addended by: Mickel Crow on: 08/08/2020 10:42 AM   Modules accepted: Orders

## 2020-08-08 NOTE — Telephone Encounter (Signed)
Pt is out of treatment window for orals.   Angelena Form PA-C  [facility]

## 2020-08-10 ENCOUNTER — Encounter: Payer: Self-pay | Admitting: Physician Assistant

## 2020-08-19 ENCOUNTER — Emergency Department (HOSPITAL_BASED_OUTPATIENT_CLINIC_OR_DEPARTMENT_OTHER)
Admission: EM | Admit: 2020-08-19 | Discharge: 2020-08-20 | Disposition: A | Discharge status: Home / Self Care | Payer: BC Managed Care – PPO | Attending: Emergency Medicine | Admitting: Emergency Medicine

## 2020-08-19 ENCOUNTER — Encounter (HOSPITAL_BASED_OUTPATIENT_CLINIC_OR_DEPARTMENT_OTHER): Payer: Self-pay | Admitting: *Deleted

## 2020-08-19 ENCOUNTER — Other Ambulatory Visit: Payer: Self-pay

## 2020-08-19 DIAGNOSIS — Z8616 Personal history of COVID-19: Secondary | ICD-10-CM | POA: Diagnosis not present

## 2020-08-19 DIAGNOSIS — J45909 Unspecified asthma, uncomplicated: Secondary | ICD-10-CM | POA: Insufficient documentation

## 2020-08-19 DIAGNOSIS — E039 Hypothyroidism, unspecified: Secondary | ICD-10-CM | POA: Insufficient documentation

## 2020-08-19 DIAGNOSIS — Z79899 Other long term (current) drug therapy: Secondary | ICD-10-CM | POA: Insufficient documentation

## 2020-08-19 DIAGNOSIS — M7989 Other specified soft tissue disorders: Secondary | ICD-10-CM | POA: Diagnosis not present

## 2020-08-19 DIAGNOSIS — I824Y9 Acute embolism and thrombosis of unspecified deep veins of unspecified proximal lower extremity: Secondary | ICD-10-CM

## 2020-08-19 HISTORY — DX: COVID-19: U07.1

## 2020-08-19 NOTE — ED Triage Notes (Signed)
C/o left lower leg swelling x 1 day

## 2020-08-20 ENCOUNTER — Ambulatory Visit (HOSPITAL_BASED_OUTPATIENT_CLINIC_OR_DEPARTMENT_OTHER)
Admission: RE | Admit: 2020-08-20 | Discharge: 2020-08-20 | Disposition: A | Discharge status: Home / Self Care | Payer: BC Managed Care – PPO | Source: Ambulatory Visit | Attending: Emergency Medicine | Admitting: Emergency Medicine

## 2020-08-20 ENCOUNTER — Telehealth (HOSPITAL_BASED_OUTPATIENT_CLINIC_OR_DEPARTMENT_OTHER): Payer: Self-pay | Admitting: Emergency Medicine

## 2020-08-20 DIAGNOSIS — I82432 Acute embolism and thrombosis of left popliteal vein: Secondary | ICD-10-CM | POA: Insufficient documentation

## 2020-08-20 DIAGNOSIS — I82412 Acute embolism and thrombosis of left femoral vein: Secondary | ICD-10-CM | POA: Insufficient documentation

## 2020-08-20 DIAGNOSIS — M7989 Other specified soft tissue disorders: Secondary | ICD-10-CM | POA: Insufficient documentation

## 2020-08-20 DIAGNOSIS — I82452 Acute embolism and thrombosis of left peroneal vein: Secondary | ICD-10-CM | POA: Insufficient documentation

## 2020-08-20 LAB — BASIC METABOLIC PANEL
Anion gap: 6 (ref 5–15)
BUN: 19 mg/dL (ref 6–20)
CO2: 27 mmol/L (ref 22–32)
Calcium: 8.7 mg/dL — ABNORMAL LOW (ref 8.9–10.3)
Chloride: 106 mmol/L (ref 98–111)
Creatinine, Ser: 1.34 mg/dL — ABNORMAL HIGH (ref 0.61–1.24)
GFR, Estimated: >60 mL/min (ref >60)
Glucose, Bld: 108 mg/dL — ABNORMAL HIGH (ref 70–99)
Potassium: 4 mmol/L (ref 3.5–5.1)
Sodium: 139 mmol/L (ref 135–145)

## 2020-08-20 LAB — CBC WITH DIFFERENTIAL/PLATELET
Abs Immature Granulocytes: 0.02 10*3/uL (ref 0.00–0.07)
Basophils Absolute: 0.1 10*3/uL (ref 0.0–0.1)
Basophils Relative: 1 %
Eosinophils Absolute: 0.2 10*3/uL (ref 0.0–0.5)
Eosinophils Relative: 3 %
HCT: 43.1 % (ref 39.0–52.0)
Hemoglobin: 14.5 g/dL (ref 13.0–17.0)
Immature Granulocytes: 0 %
Lymphocytes Relative: 35 %
Lymphs Abs: 3.1 10*3/uL (ref 0.7–4.0)
MCH: 30.6 pg (ref 26.0–34.0)
MCHC: 33.6 g/dL (ref 30.0–36.0)
MCV: 90.9 fL (ref 80.0–100.0)
Monocytes Absolute: 0.7 10*3/uL (ref 0.1–1.0)
Monocytes Relative: 9 %
Neutro Abs: 4.6 10*3/uL (ref 1.7–7.7)
Neutrophils Relative %: 52 %
Platelets: 241 10*3/uL (ref 150–400)
RBC: 4.74 MIL/uL (ref 4.22–5.81)
RDW: 12.9 % (ref 11.5–15.5)
WBC: 8.8 10*3/uL (ref 4.0–10.5)
nRBC: 0 % (ref 0.0–0.2)

## 2020-08-20 LAB — D-DIMER, QUANTITATIVE: D-Dimer, Quant: 2.03 ug/mL-FEU — ABNORMAL HIGH (ref 0.00–0.50)

## 2020-08-20 MED ORDER — ENOXAPARIN SODIUM 100 MG/ML IJ SOSY
100.0000 mg | PREFILLED_SYRINGE | Freq: Once | INTRAMUSCULAR | Status: AC
  Administered 2020-08-20: 100 mg via SUBCUTANEOUS
  Filled 2020-08-20: qty 1

## 2020-08-20 MED ORDER — RIVAROXABAN (XARELTO) VTE STARTER PACK (15 & 20 MG): ORAL_TABLET | ORAL | 0 refills | Status: DC

## 2020-08-20 NOTE — ED Provider Notes (Addendum)
Patient returns on 08/20/2020 for an ultrasound of his lower extremity which was found to be positive for DVT on the left side.  Given a prescription of xarelto to go home with.  Advise follow-up with his doctor within the week.    Luna Fuse, MD 08/20/20 1350    Luna Fuse, MD 08/21/20 [phone]

## 2020-08-20 NOTE — ED Provider Notes (Signed)
[facility] Provider Note   CSN: [id number] Arrival date & time: 08/19/20  2129     History Chief Complaint  Patient presents with  . Leg Swelling    Ryan [name] is a 55 y.o. male.  Patient is a 55 year old male with history of hyperlipidemia, hypothyroidism, and recent diagnosis of COVID 2 weeks ago.  He presents today for evaluation of left leg swelling.  He describes swelling from his knee down to his ankle that began in the absence of any injury or trauma.  He noticed this yesterday.  He denies any pain, fever, chest pain, or shortness of breath.  He denies any recent travel.  There are no aggravating or alleviating factors.  The history is provided by the patient.       Past Medical History:  Diagnosis Date  . Allergy    mild  . Asthma    athletic induced asthma age 69   . Bronchitis    with viral infection nov/Dec 2019  . COVID-19   . Deviated septum   . Nephritis 1972    Patient Active Problem List   Diagnosis Date Noted  . SOB (shortness of breath) 05/05/2020  . Non-allergic rhinitis 05/05/2020  . Throat clearing 05/05/2020  . Tachycardia 02/17/2018  . Bronchitis 02/17/2018  . Subclinical hypothyroidism 08/06/2017  . Elevated low density lipoprotein (LDL) cholesterol level 08/06/2017  . Obesity, Class I, BMI 30-34.9 08/06/2017  . Vitamin D insufficiency 03/05/2017  . S/P appendectomy 1985 or so 02/05/2017  . Family history of diverticulitis of colon 02/05/2017  . Family history of diabetes mellitus in maternal grandmother 02/05/2017  . Family history of heart disease in male family member before age 86 02/05/2017  . Environmental and seasonal allergies 02/05/2017  . Nephritis- 1972 03/19/1970    Past Surgical History:  Procedure Laterality Date  . APPENDECTOMY  1984       Family History  Problem Relation Age of Onset  . Alcohol abuse Mother   . Heart attack Father   . Diabetes Maternal Grandfather   .  Pancreatic cancer Maternal Aunt   . Colon cancer Neg Hx   . Colon polyps Neg Hx   . Esophageal cancer Neg Hx   . Rectal cancer Neg Hx   . Stomach cancer Neg Hx   . Allergic rhinitis Neg Hx   . Angioedema Neg Hx   . Asthma Neg Hx   . Atopy Neg Hx   . Eczema Neg Hx   . Immunodeficiency Neg Hx   . Urticaria Neg Hx     Social History   Tobacco Use  . Smoking status: Never Smoker  . Smokeless tobacco: Never Used  Vaping Use  . Vaping Use: Never used  Substance Use Topics  . Alcohol use: Yes    Alcohol/week: 1.0 standard drink    Types: 1 Standard drinks or equivalent per week    Comment: social  . Drug use: No    Home Medications Prior to Admission medications   Medication Sig Start Date End Date Taking? Authorizing Provider  albuterol (VENTOLIN HFA) 108 (90 Base) MCG/ACT inhaler Inhale 2 puffs into the lungs every 4 (four) hours as needed for wheezing or shortness of breath. 05/05/20   Valentina Shaggy, MD  ciclesonide (ALVESCO) 160 MCG/ACT inhaler Inhale 2 puffs into the lungs 2 (two) times daily. 07/12/20   Valentina Shaggy, MD  levothyroxine (SYNTHROID) 25 MCG tablet Take 1 tablet (25 mcg total) by mouth  daily before breakfast. 05/30/20   Lorrene Reid, PA-C  omeprazole (PRILOSEC) 20 MG capsule Take 1 capsule (20 mg total) by mouth daily. Patient not taking: Reported on 06/02/2020 05/05/20 06/04/20  Valentina Shaggy, MD  rosuvastatin (CRESTOR) 5 MG tablet Take 1 tablet (5 mg total) by mouth daily. 07/15/20   Lorrene Reid, PA-C    Allergies    Patient has no known allergies.  Review of Systems   Review of Systems  All other systems reviewed and are negative.   Physical Exam Updated Vital Signs BP 133/86 (BP Location: Left Arm)   Pulse 93   Temp 98 F (36.7 C) (Oral)   Resp 16   Ht 5\' 11"  (1.803 m)   Wt 103 kg   SpO2 97%   BMI 31.66 kg/m   Physical Exam Vitals and nursing note reviewed.  Constitutional:      General: He is not in acute  distress.    Appearance: He is well-developed. He is not diaphoretic.  HENT:     Head: Normocephalic and atraumatic.  Cardiovascular:     Rate and Rhythm: Normal rate and regular rhythm.     Heart sounds: No murmur heard. No friction rub.  Pulmonary:     Effort: Pulmonary effort is normal. No respiratory distress.     Breath sounds: Normal breath sounds. No wheezing or rales.  Abdominal:     General: Bowel sounds are normal. There is no distension.     Palpations: Abdomen is soft.     Tenderness: There is no abdominal tenderness.  Musculoskeletal:        General: Normal range of motion.     Cervical back: Normal range of motion and neck supple.     Right lower leg: No edema.     Comments: There is slight, nonpitting edema of the left lower leg.  There is no calf tenderness and Homans' sign is absent.  DP pulses are easily palpable.  Skin:    General: Skin is warm and dry.  Neurological:     Mental Status: He is alert and oriented to person, place, and time.     Coordination: Coordination normal.     ED Results / Procedures / Treatments   Labs (all labs ordered are listed, but only abnormal results are displayed) Labs Reviewed  BASIC METABOLIC PANEL  CBC WITH DIFFERENTIAL/PLATELET  D-DIMER, QUANTITATIVE    EKG None  Radiology No results found.  Procedures Procedures   Medications Ordered in ED Medications  enoxaparin (LOVENOX) injection 100 mg (has no administration in time range)    ED Course  I have reviewed the triage vital signs and the nursing notes.  Pertinent labs & imaging results that were available during my care of the patient were reviewed by me and considered in my medical decision making (see chart for details).    MDM Rules/Calculators/A&P  Patient presenting here with complaints of swelling to his left lower leg in the absence of any injury or trauma.  Patient did have COVID 2 weeks ago.  My concern is for DVT.  His D-dimer is elevated at  2.03.  As ultrasound has left for the evening patient will return in the morning for the ultrasound.  He will be given Lovenox this evening until the results of the ultrasound are known.  Final Clinical Impression(s) / ED Diagnoses Final diagnoses:  None    Rx / DC Orders ED Discharge Orders    None  Veryl Speak, MD 08/20/20 [phone]

## 2020-08-20 NOTE — Discharge Instructions (Addendum)
Return tomorrow at the given time for an ultrasound of your leg to rule out a blood clot.

## 2020-08-23 ENCOUNTER — Ambulatory Visit: Payer: BC Managed Care – PPO | Admitting: Cardiology

## 2020-08-23 ENCOUNTER — Encounter: Payer: Self-pay | Admitting: Cardiology

## 2020-08-23 ENCOUNTER — Other Ambulatory Visit: Payer: Self-pay

## 2020-08-23 VITALS — BP 84/50 | HR 105 | Ht 71.0 in | Wt 235.0 lb

## 2020-08-23 DIAGNOSIS — R002 Palpitations: Secondary | ICD-10-CM | POA: Diagnosis not present

## 2020-08-23 DIAGNOSIS — I82402 Acute embolism and thrombosis of unspecified deep veins of left lower extremity: Secondary | ICD-10-CM | POA: Diagnosis not present

## 2020-08-23 MED ORDER — RIVAROXABAN 20 MG PO TABS: 20.0000 mg | ORAL_TABLET | Freq: Every day | ORAL | 4 refills | Status: DC

## 2020-08-23 NOTE — Progress Notes (Signed)
Cardiology Office Note:    Date:  08/23/2020   ID:  Ryan [name], DOB 1965-05-21, MRN [id number]  PCP:  Lorrene Reid, PA-C   [facility] Providers Cardiologist:  None     Referring MD: Lorrene Reid, PA-C    History of Present Illness:    Ryan [name] is a 55 y.o. male here for follow up of palpitations. Also status post from ED of DVT.  Today, Ryan [name] recently visited the ED. A DVT was found in his left leg with minimal swelling. He was prescribed xarelto  He reports his normal routine has not changed but he is experiencing polyuria. He has a cup of coffee in the morning and water throughout the day. He questions if these symptoms can be associated with xarelto.  Also he has felt more fatigue towards the evening with a sore throat and dry cough. He was diagnosed at [facility] on the 18th of May with COVID.  He denies having any exertional shortness of breath, chest pain, tightness, or pressure. He denies PND, orthopnea, lightheadedness, or syncopal episodes.   He reports that he typically averages 110 for his HR.  BP Readings from Last 3 Encounters:  08/23/20 (!) 84/50  08/20/20 (!) 131/95  06/02/20 112/62     Past Medical History:  Diagnosis Date  . Allergy    mild  . Asthma    athletic induced asthma age 61   . Bronchitis    with viral infection nov/Dec 2019  . COVID-19   . Deviated septum   . Nephritis 1972    Past Surgical History:  Procedure Laterality Date  . APPENDECTOMY  1984    Current Medications: Current Meds  Medication Sig  . ciclesonide (ALVESCO) 160 MCG/ACT inhaler Inhale 2 puffs into the lungs 2 (two) times daily.  Marland Kitchen levothyroxine (SYNTHROID) 25 MCG tablet Take 1 tablet (25 mcg total) by mouth daily before breakfast.  . rivaroxaban (XARELTO) 20 MG TABS tablet Take 1 tablet (20 mg total) by mouth daily with supper.  . rosuvastatin (CRESTOR) 5 MG tablet Take 1 tablet (5 mg total) by mouth daily.  . [DISCONTINUED] RIVAROXABAN (XARELTO) VTE STARTER  PACK (15 & 20 MG) Follow package directions: Take one 15mg  tablet by mouth twice a day. On day 22, switch to one 20mg  tablet once a day. Take with food.     Allergies:   Patient has no known allergies.   Social History   Socioeconomic History  . Marital status: Married    Spouse name: Not on file  . Number of children: 4  . Years of education: Not on file  . Highest education level: Master's degree (e.g., MA, MS, MEng, MEd, MSW, MBA)  Occupational History  . Occupation: Engineer, agricultural: [employer]  Tobacco Use  . Smoking status: Never Smoker  . Smokeless tobacco: Never Used  Vaping Use  . Vaping Use: Never used  Substance and Sexual Activity  . Alcohol use: Yes    Alcohol/week: 1.0 standard drink    Types: 1 Standard drinks or equivalent per week    Comment: social  . Drug use: No  . Sexual activity: Yes    Birth control/protection: None  Other Topics Concern  . Not on file  Social History Narrative  . Not on file   Social Determinants of Health   Financial Resource Strain: Not on file  Food Insecurity: Not on file  Transportation Needs: Not on file  Physical Activity:  Not on file  Stress: Not on file  Social Connections: Not on file     Family History: The patient'sfamily history includes Alcohol abuse in his mother; Diabetes in his maternal grandfather; Heart attack in his father; Pancreatic cancer in his maternal aunt. There is no history of Colon cancer, Colon polyps, Esophageal cancer, Rectal cancer, Stomach cancer, Allergic rhinitis, Angioedema, Asthma, Atopy, Eczema, Immunodeficiency, or Urticaria.  ROS:   Please see the history of present illness. All other systems reviewed and are negative.  EKGs/Labs/Other Studies Reviewed:    The following studies were reviewed today: DVT Lower Unilateral Left(DVT) 08/23/20:  FINDINGS: VENOUS Nonocclusive hypoechoic thrombus is seen within distal LEFT femoral, popliteal, and peroneal  veins. The segments demonstrate impaired spontaneous venous flow and impaired compressibility. Common femoral, profundus femoral and proximal femoral veins appear patent. Saphenofemoral junction patent.  Contralateral RIGHT common femoral vein is patent and compressible with spontaneous flow. Echo 03/16/20  IMPRESSIONS  1. Left ventricular ejection fraction, by estimation, is 55 to 60%. The  left ventricle has normal function. The left ventricle has no regional  wall motion abnormalities. Left ventricular diastolic parameters are  consistent with Grade I diastolic  dysfunction (impaired relaxation). The average left ventricular global  longitudinal strain is -22.5 %. The global longitudinal strain is normal.  2. Right ventricular systolic function is normal. The right ventricular  size is normal.  3. The mitral valve is grossly normal. Trivial mitral valve  regurgitation.  4. The aortic valve is tricuspid. Aortic valve regurgitation is not  visualized. No aortic stenosis is present.  5. Aortic dilatation noted. There is mild dilatation of the aortic root,  measuring 41 mm. There is borderline dilatation of the ascending aorta,  measuring 39 mm.  6. The inferior vena cava is normal in size with greater than 50%  respiratory variability, suggesting right atrial pressure of 3 mmHg.   Comparison(s): No prior Echocardiogram.   EKG:   08/23/20-EKG is not ordered today  Recent Labs: 05/27/2020: ALT 32; TSH 3.390 08/20/2020: BUN 19; Creatinine, Ser 1.34; Hemoglobin 14.5; Platelets 241; Potassium 4.0; Sodium 139  Recent Lipid Panel    Component Value Date/Time   CHOL 144 05/27/2020 0838   TRIG 56 05/27/2020 0838   HDL 49 05/27/2020 0838   CHOLHDL 2.9 05/27/2020 0838   LDLCALC 83 05/27/2020 0838     Risk Assessment/Calculations:      Physical Exam:    VS:  BP (!) 84/50 (BP Location: Left Arm, Patient Position: Sitting, Cuff Size: Normal)   Pulse (!) 105   Ht 5\' 11"  (1.803 m)    Wt 235 lb (106.6 kg)   SpO2 95%   BMI 32.78 kg/m     Wt Readings from Last 3 Encounters:  08/23/20 235 lb (106.6 kg)  08/19/20 227 lb (103 kg)  07/15/20 226 lb (102.5 kg)     GEN: Well nourished, well developed in no acute distress HEENT: Normal NECK: No JVD; No carotid bruits LYMPHATICS: No lymphadenopathy CARDIAC: RRR, no murmurs, rubs, gallops RESPIRATORY:  Clear to auscultation without rales, wheezing or rhonchi  ABDOMEN: Soft, non-tender, non-distended MUSCULOSKELETAL: 2+ bilateral lower extremity edema; No deformity  SKIN: slightly diaphoretic NEUROLOGIC:  Alert and oriented x 3 PSYCHIATRIC:  Normal affect   ASSESSMENT:    1. Acute deep vein thrombosis (DVT) of left lower extremity, unspecified vein (HCC)   2. Palpitations    PLAN:    In order of problems listed above:  DVT - I will go ahead  and treat him for total of 6 months with Xarelto.  Could contemplate 3 months of treatment if necessary.  Palpitations -No current issues.  Chronic anticoagulation - He has gotten a Xarelto starter pack.  Treatment was started on 08/19/2020.  Continue for 6 months.  We will give him further prescription for Xarelto for 20 mg a day.  He will take this after he finishes the starter pack.  Recent COVID infection - Corresponds with deep vein thrombosis.  Also does have a mild dry cough.  With hypotension that is asymptomatic, encouraged hydration, salt liberalization.  Often he states that his blood pressure can be in the mid 90s to low 100s.  He will recheck his blood pressure at home.  His blood pressure in the emergency department was 133/86.  6 month follow up      Medication Adjustments/Labs and Tests Ordered: Current medicines are reviewed at length with the patient today.  Concerns regarding medicines are outlined above.  No orders of the defined types were placed in this encounter.  Meds ordered this encounter  Medications  . rivaroxaban (XARELTO) 20 MG TABS  tablet    Sig: Take 1 tablet (20 mg total) by mouth daily with supper.    Dispense:  30 tablet    Refill:  4    Patient Instructions  Medication Instructions:  The current medical regimen is effective;  continue present plan and medications.  *If you need a refill on your cardiac medications before your next appointment, please call your pharmacy*  Follow-Up: At [facility], you and your health needs are our priority.  As part of our continuing mission to provide you with exceptional heart care, we have created designated Provider Care Teams.  These Care Teams include your primary Cardiologist (physician) and Advanced Practice Providers (APPs -  Physician Assistants and Nurse Practitioners) who all work together to provide you with the care you need, when you need it.  We recommend signing up for the patient portal called "MyChart".  Sign up information is provided on this After Visit Summary.  MyChart is used to connect with patients for Virtual Visits (Telemedicine).  Patients are able to view lab/test results, encounter notes, upcoming appointments, etc.  Non-urgent messages can be sent to your provider as well.   To learn more about what you can do with MyChart, go to [email or web address].    Your next appointment:   6 month(s)  The format for your next appointment:   In Person  Provider:   Candee Furbish, MD  Thank you for choosing [facility]!!          I,Alexis Bryant,acting as a scribe for Candee Furbish, MD.,have documented all relevant documentation on the behalf of Candee Furbish, MD,as directed by  Candee Furbish, MD while in the presence of Candee Furbish, MD.  I, Candee Furbish, MD, have reviewed all documentation for this visit. The documentation on 08/23/20 for the exam, diagnosis, procedures, and orders are all accurate and complete. Signed, Candee Furbish, MD  08/23/2020 3:26 PM    [facility]

## 2020-08-23 NOTE — Patient Instructions (Signed)

## 2020-09-01 ENCOUNTER — Other Ambulatory Visit: Payer: Self-pay

## 2020-09-01 ENCOUNTER — Other Ambulatory Visit: Payer: Self-pay | Admitting: Physician Assistant

## 2020-09-01 ENCOUNTER — Ambulatory Visit (INDEPENDENT_AMBULATORY_CARE_PROVIDER_SITE_OTHER): Payer: BC Managed Care – PPO | Admitting: Nurse Practitioner

## 2020-09-01 ENCOUNTER — Encounter: Payer: Self-pay | Admitting: Nurse Practitioner

## 2020-09-01 VITALS — BP 108/66 | HR 77 | Temp 98.9°F | Ht 72.0 in | Wt 237.5 lb

## 2020-09-01 DIAGNOSIS — E785 Hyperlipidemia, unspecified: Secondary | ICD-10-CM

## 2020-09-01 DIAGNOSIS — Z8616 Personal history of COVID-19: Secondary | ICD-10-CM | POA: Diagnosis not present

## 2020-09-01 DIAGNOSIS — E038 Other specified hypothyroidism: Secondary | ICD-10-CM

## 2020-09-01 DIAGNOSIS — I824Y2 Acute embolism and thrombosis of unspecified deep veins of left proximal lower extremity: Secondary | ICD-10-CM

## 2020-09-01 MED ORDER — LEVOTHYROXINE SODIUM 25 MCG PO TABS: 25.0000 ug | ORAL_TABLET | Freq: Every day | ORAL | 1 refills | Status: DC

## 2020-09-01 MED ORDER — ROSUVASTATIN CALCIUM 5 MG PO TABS: 5.0000 mg | ORAL_TABLET | Freq: Every day | ORAL | 1 refills | Status: DC

## 2020-09-01 NOTE — Progress Notes (Signed)
Established Patient Office Visit  Subjective:  Patient ID: Ryan [name], male    DOB: 08-01-1965  Age: 55 y.o. MRN: [id number]  CC:  Chief Complaint  Patient presents with   Hospitalization Follow-up    HPI Ryan [name] presents for follow up of hospitalization. States that he has diagnosed with COVID 19 on 08/01/2020. Had symptoms including scratchy throat, body aches, fever, and chills. States that he really though symptoms were related to allergies until he developed fever which was three days into the symptoms.  The patient states he was too late for antiviral treatment.  States he was prescribed a rescue inhaler from the asthma center in [facility].  States that he started feeling better.  After a few days he developed swelling in the left leg.  He went to the urgent care on August 20, 2020.  He was diagnosed with a blood clot in the left leg.  He was started on Xarelto 20 mg.  He is taking this twice a day.  He states that overall he is feeling better.  His legs feel tired. The patient denies new concerns or complaints.  He denies chest pain, chest pressure, or shortness of breath. He denies headaches or visual disturbances. He denies abdominal pain, nausea, vomiting, or changes in bowel or bladder habits.   The patient states that he needs refills for his routine medications.  Past Medical History:  Diagnosis Date   Allergy    mild   Asthma    athletic induced asthma age 70    Bronchitis    with viral infection nov/Dec 2019   COVID-19    Deviated septum    Nephritis 1972    Past Surgical History:  Procedure Laterality Date   APPENDECTOMY  1984    Family History  Problem Relation Age of Onset   Alcohol abuse Mother    Heart attack Father    Diabetes Maternal Grandfather    Pancreatic cancer Maternal Aunt    Colon cancer Neg Hx    Colon polyps Neg Hx    Esophageal cancer Neg Hx    Rectal cancer Neg Hx    Stomach cancer Neg Hx    Allergic rhinitis Neg Hx     Angioedema Neg Hx    Asthma Neg Hx    Atopy Neg Hx    Eczema Neg Hx    Immunodeficiency Neg Hx    Urticaria Neg Hx     Social History   Socioeconomic History   Marital status: Married    Spouse name: Not on file   Number of children: 4   Years of education: Not on file   Highest education level: Master's degree (e.g., MA, MS, MEng, MEd, MSW, MBA)  Occupational History   Occupation: Engineer, agricultural: [employer]  Tobacco Use   Smoking status: Never   Smokeless tobacco: Never  Vaping Use   Vaping Use: Never used  Substance and Sexual Activity   Alcohol use: Yes    Alcohol/week: 1.0 standard drink    Types: 1 Standard drinks or equivalent per week    Comment: social   Drug use: No   Sexual activity: Yes    Birth control/protection: None  Other Topics Concern   Not on file  Social History Narrative   Not on file   Social Determinants of Health   Financial Resource Strain: Not on file  Food Insecurity: Not on file  Transportation Needs: Not  on file  Physical Activity: Not on file  Stress: Not on file  Social Connections: Not on file  Intimate Partner Violence: Not on file    Outpatient Medications Prior to Visit  Medication Sig Dispense Refill   ciclesonide (ALVESCO) 160 MCG/ACT inhaler Inhale 2 puffs into the lungs 2 (two) times daily. 1 each 5   rivaroxaban (XARELTO) 20 MG TABS tablet Take 1 tablet (20 mg total) by mouth daily with supper. 30 tablet 4   levothyroxine (SYNTHROID) 25 MCG tablet TAKE 1 TABLET BY MOUTH DAILY BEFORE BREAKFAST. 90 tablet 0   rosuvastatin (CRESTOR) 5 MG tablet Take 1 tablet (5 mg total) by mouth daily. 90 tablet 1   No facility-administered medications prior to visit.    No Known Allergies  ROS Review of Systems  Constitutional:  Positive for fatigue. Negative for activity change, chills and fever.  HENT:  Negative for congestion, sinus pressure, sinus pain, sneezing and sore throat.   Eyes:  Negative.   Respiratory:  Negative for cough, choking, chest tightness and wheezing.   Cardiovascular:  Positive for leg swelling. Negative for chest pain and palpitations.       Legs feel tired since diagnosis with DVT  Gastrointestinal:  Negative for constipation, diarrhea and vomiting.  Endocrine: Negative for cold intolerance, heat intolerance, polydipsia and polyuria.  Musculoskeletal:  Negative for arthralgias, back pain and myalgias.  Skin:  Negative for rash.  Allergic/Immunologic: Negative.   Neurological:  Negative for dizziness, weakness and headaches.  Hematological: Negative.   Psychiatric/Behavioral:  The patient is not nervous/anxious.      Objective:    Physical Exam Vitals and nursing note reviewed.  Constitutional:      Appearance: Normal appearance. He is well-developed.  HENT:     Head: Normocephalic and atraumatic.     Nose: Nose normal.     Mouth/Throat:     Mouth: Mucous membranes are moist.  Eyes:     Extraocular Movements: Extraocular movements intact.     Conjunctiva/sclera: Conjunctivae normal.     Pupils: Pupils are equal, round, and reactive to light.  Cardiovascular:     Rate and Rhythm: Normal rate and regular rhythm.     Pulses: Normal pulses.     Heart sounds: Normal heart sounds.  Pulmonary:     Effort: Pulmonary effort is normal.     Breath sounds: Normal breath sounds.  Abdominal:     Palpations: Abdomen is soft.  Musculoskeletal:        General: Normal range of motion.     Cervical back: Normal range of motion and neck supple.     Comments: Mild tenderness of left lower leg with palpation.  Lymphadenopathy:     Cervical: No cervical adenopathy.  Skin:    General: Skin is warm and dry.     Capillary Refill: Capillary refill takes less than 2 seconds.  Neurological:     General: No focal deficit present.     Mental Status: He is alert and oriented to person, place, and time.  Psychiatric:        Mood and Affect: Mood normal.         Behavior: Behavior normal.        Thought Content: Thought content normal.        Judgment: Judgment normal.    Today's Vitals   09/01/20 1506  BP: 108/66  Pulse: 77  Temp: 98.9 F (37.2 C)  SpO2: 97%  Weight: 237 lb 8 oz (107.7 kg)  Height: 6' (1.829 m)   Body mass index is 32.21 kg/m.   Wt Readings from Last 3 Encounters:  09/01/20 237 lb 8 oz (107.7 kg)  08/23/20 235 lb (106.6 kg)  08/19/20 227 lb (103 kg)     Health Maintenance Due  Topic Date Due   COLONOSCOPY (Pts 45-56yrs Insurance coverage will need to be confirmed)  Never done   COVID-19 Vaccine (2 - Moderna series) 03/24/2020   Zoster Vaccines- Shingrix (2 of 2) 06/09/2020    There are no preventive care reminders to display for this patient.  Lab Results  Component Value Date   TSH 3.390 05/27/2020   Lab Results  Component Value Date   WBC 8.8 08/20/2020   HGB 14.5 08/20/2020   HCT 43.1 08/20/2020   MCV 90.9 08/20/2020   PLT 241 08/20/2020   Lab Results  Component Value Date   NA 139 08/20/2020   K 4.0 08/20/2020   CO2 27 08/20/2020   GLUCOSE 108 (H) 08/20/2020   BUN 19 08/20/2020   CREATININE 1.34 (H) 08/20/2020   BILITOT 0.4 05/27/2020   ALKPHOS 58 05/27/2020   AST 19 05/27/2020   ALT 32 05/27/2020   PROT 6.6 05/27/2020   ALBUMIN 4.2 05/27/2020   CALCIUM 8.7 (L) 08/20/2020   ANIONGAP 6 08/20/2020   EGFR 68 05/27/2020   Lab Results  Component Value Date   CHOL 144 05/27/2020   Lab Results  Component Value Date   HDL 49 05/27/2020   Lab Results  Component Value Date   LDLCALC 83 05/27/2020   Lab Results  Component Value Date   TRIG 56 05/27/2020   Lab Results  Component Value Date   CHOLHDL 2.9 05/27/2020   Lab Results  Component Value Date   HGBA1C 5.3 04/07/2020      Assessment & Plan:  1. Deep vein thrombosis (DVT) of proximal vein of left lower extremity, unspecified chronicity (HCC) New diagnosis of deep vein thrombosis of left lower leg after having  COVID-19.  He should continue Eliquis 20 mg twice daily as previously prescribed.  Will monitor.  2. Personal history of COVID-19 Patient diagnosed with COVID-19 on Aug 01, 2020.  Patient's symptoms have resolved.  He should continue to follow-up with asthma center as scheduled.  Use rescue inhaler as needed and as prescribed.  3. Subclinical hypothyroidism Patient's recent thyroid panel is normal.  He should continue with levothyroxine 25 mcg daily.  Refill was sent to his pharmacy. - levothyroxine (SYNTHROID) 25 MCG tablet; Take 1 tablet (25 mcg total) by mouth daily before breakfast.  Dispense: 90 tablet; Refill: 1  4. Hyperlipidemia, unspecified hyperlipidemia type The patient should continue rosuvastatin 5 mg daily.  A refill was sent to his pharmacy. - rosuvastatin (CRESTOR) 5 MG tablet; Take 1 tablet (5 mg total) by mouth daily.  Dispense: 90 tablet; Refill: 1   Problem List Items Addressed This Visit       Endocrine   Subclinical hypothyroidism   Relevant Medications   levothyroxine (SYNTHROID) 25 MCG tablet     Other   Hyperlipidemia   Relevant Medications   rosuvastatin (CRESTOR) 5 MG tablet   Personal history of COVID-19   History of DVT (deep vein thrombosis) - Primary    Meds ordered this encounter  Medications   levothyroxine (SYNTHROID) 25 MCG tablet    Sig: Take 1 tablet (25 mcg total) by mouth daily before breakfast.    Dispense:  90 tablet    Refill:  1  Order Specific Question:   Supervising Provider    Answer:   Beatrice Lecher D [[id number]]   rosuvastatin (CRESTOR) 5 MG tablet    Sig: Take 1 tablet (5 mg total) by mouth daily.    Dispense:  90 tablet    Refill:  1    Order Specific Question:   Supervising Provider    Answer:   Beatrice Lecher D [[id number]]    Follow-up: Return in about 7 months (around 04/03/2021) for physcial.    Ronnell Freshwater, NP

## 2020-09-17 DIAGNOSIS — E785 Hyperlipidemia, unspecified: Secondary | ICD-10-CM | POA: Insufficient documentation

## 2020-09-17 DIAGNOSIS — Z8616 Personal history of COVID-19: Secondary | ICD-10-CM | POA: Insufficient documentation

## 2020-09-17 DIAGNOSIS — Z86718 Personal history of other venous thrombosis and embolism: Secondary | ICD-10-CM | POA: Insufficient documentation

## 2020-12-11 ENCOUNTER — Other Ambulatory Visit: Payer: Self-pay | Admitting: Physician Assistant

## 2020-12-11 DIAGNOSIS — E038 Other specified hypothyroidism: Secondary | ICD-10-CM

## 2020-12-14 ENCOUNTER — Encounter: Payer: Self-pay | Admitting: Physician Assistant

## 2020-12-14 ENCOUNTER — Other Ambulatory Visit: Payer: Self-pay

## 2020-12-14 ENCOUNTER — Ambulatory Visit: Payer: BC Managed Care – PPO | Admitting: Physician Assistant

## 2020-12-14 VITALS — BP 101/67 | HR 74 | Temp 98.2°F | Ht 72.0 in | Wt 234.2 lb

## 2020-12-14 DIAGNOSIS — I824Y2 Acute embolism and thrombosis of unspecified deep veins of left proximal lower extremity: Secondary | ICD-10-CM | POA: Diagnosis not present

## 2020-12-14 DIAGNOSIS — M7989 Other specified soft tissue disorders: Secondary | ICD-10-CM

## 2020-12-14 DIAGNOSIS — E038 Other specified hypothyroidism: Secondary | ICD-10-CM

## 2020-12-14 NOTE — Patient Instructions (Signed)
Deep Vein Thrombosis Deep vein thrombosis (DVT) is a condition in which a blood clot forms in a deep vein, such as a vein in the lower leg, thigh, pelvis, or arm. Deep veins are veins in the deep venous system. A clot is blood that has thickened into a gel or solid. This condition is serious and can be life-threatening if the clot travels to the lungs and causes a blockage (pulmonary embolism) in the arteries of the lung. A DVT can also damage veins in the leg. This can lead to long-term, or chronic, venous disease, leg pain, swelling, discoloration, and ulcers or sores (post-thrombotic syndrome). What are the causes? This condition may be caused by: A slowdown of blood flow. Damage to a vein. A condition that causes blood to clot more easily, such as certain blood-clotting disorders. What increases the risk? The following factors may make you more likely to develop this condition: Having obesity. Being older, especially older than age 60. Being inactive (sedentary lifestyle) or not moving around. This may include: Sitting or lying down for longer than 4-6 hours other than to sleep at night. Being in the hospital, having major or lengthy surgery, or having a thin, flexible tube (central line catheter) placed in a large vein. Being pregnant, giving birth, or having recently given birth. Taking medicines that contain estrogen, such as birth control or hormone replacement therapy. Using products that contain nicotine or tobacco, especially if you use hormonal birth control. Having a history of blood clots or a blood-clotting disease, a blood vessel disease (peripheral vascular disease), or congestive heart disease. Having a history of cancer, especially if being treated with chemotherapy. What are the signs or symptoms? Symptoms of this condition include: Swelling, pain, pressure, or tenderness in an arm or a leg. An arm or a leg becoming warm, red, or discolored. A leg turning very pale. You may  have a large DVT. This is rare. If the clot is in your leg, you may notice symptoms more or have worse symptoms when you stand or walk. In some cases, there are no symptoms. How is this diagnosed? This condition is diagnosed with: Your medical history and a physical exam. Tests, such as: Blood tests to check how well your blood clots. Doppler ultrasound. This is the best way to find a DVT. Venogram. Contrast dye is injected into a vein, and X-rays are taken to check for clots. How is this treated? Treatment for this condition depends on: The cause of your DVT. The size and location of your DVT, or having more than one DVT. Your risk for bleeding or developing more clots. Other medical conditions you may have. Treatment may include: Taking a blood thinner, also called an anticoagulant, to prevent clots from forming and growing. Wearing compression stockings, if directed. Injecting medicines into the affected vein to break up the clot (catheter-directed thrombolysis). This is used only for severe DVT and only if a specialist recommends it. Specific surgical procedures, when DVT is severe or hard to treat. These may be done to: Isolate and remove your clot. Place an inferior vena cava (IVC) filter in a large vein to catch blood clots before they reach your lungs. You may get some medical treatments for 6 months or longer. Follow these instructions at home: If you are taking blood thinners: Talk with your health care provider before you take any medicines that contain aspirin or NSAIDs, such as ibuprofen. These medicines increase your risk for dangerous bleeding. Take your medicine exactly as   told, at the same time every day. Do not skip a dose. Do not take more than the prescribed dose. This is important. Ask your health care provider about foods and medicines that could change the way your blood thinner works (may interact). Avoid these foods and medicines if you are told to do so. Avoid  anything that may cause bleeding or bruising. You may bleed more easily while taking blood thinners. Be very careful when using knives, scissors, or other sharp objects. Use an electric razor instead of a blade. Avoid activities that could cause injury or bruising, and follow instructions for preventing falls. Tell your health care provider if you have had any internal bleeding, bleeding ulcers, or neurologic diseases, such as strokes or cerebral aneurysms. Wear a medical alert bracelet or carry a card that lists what medicines you take. General instructions Take over-the-counter and prescription medicines only as told by your health care provider. Return to your normal activities as told by your health care provider. Ask your health care provider what activities are safe for you. If recommended, wear compression stockings as told by your health care provider. These stockings help to prevent blood clots and reduce swelling in your legs. Keep all follow-up visits as told by your health care provider. This is important. Contact a health care provider if: You miss a dose of your blood thinner. You have new or worse pain, swelling, or redness in an arm or a leg. You have worsening numbness or tingling in an arm or a leg. You have unusual bruising. Get help right away if: You have signs or symptoms that a blood clot has moved to the lungs. These may include: Shortness of breath. Chest pain. Fast or irregular heartbeats (palpitations). Light-headedness or dizziness. Coughing up blood. You have signs or symptoms that your blood is too thin. These may include: Blood in your vomit, stool, or urine. A cut that will not stop bleeding. A menstrual period that is heavier than usual. A severe headache or confusion. These symptoms may represent a serious problem that is an emergency. Do not wait to see if the symptoms will go away. Get medical help right away. Call your local emergency services (911 in  the [place] Do not drive yourself to the hospital. Summary Deep vein thrombosis (DVT) happens when a blood clot forms in a deep vein. This may occur in the lower leg, thigh, pelvis, or arm. Symptoms affect the arm or leg and can include swelling, pain, tenderness, warmth, redness, or discoloration. This condition may be treated with medicines or compression stockings. In severe cases, surgery may be done. If you are taking blood thinners, take them exactly as told. Do not skip a dose. Do not take more than is prescribed. Get help right away if you have shortness of breath, chest pain, fast or irregular heartbeats, or blood in your vomit, urine, or stool. This information is not intended to replace advice given to you by your health care provider. Make sure you discuss any questions you have with your health care provider. Document Revised: 02/27/2019 Document Reviewed: 02/28/2019 Elsevier Patient Education  2022 [employer]vier Inc.  

## 2020-12-14 NOTE — Progress Notes (Signed)
Established Patient Office Visit  Subjective:  Patient ID: Ryan [name], male    DOB: 11/13/1965  Age: 55 y.o. MRN: [id number]  CC:  Chief Complaint  Patient presents with   Follow-up    HPI Ryan [name] presents for follow up on DVT. Patient was started on anticoagulant in June 2022 for DVT post Covid-19 infection in May 2022. Patient reports taking Xarelto without issues. Has not experienced easy bruising or bleeding with blood thinner. Continues to have left lower leg swelling and tightness at his calf area. Denies pain or redness. No chest pain, palpitations, shortness of breath, dizziness or syncope. Patient reports has noticed an improvement with his fatigue and cognition since starting thyroid medication.   Past Medical History:  Diagnosis Date   Allergy    mild   Asthma    athletic induced asthma age 47    Bronchitis    with viral infection nov/Dec 2019   COVID-19    Deviated septum    Nephritis 1972    Past Surgical History:  Procedure Laterality Date   APPENDECTOMY  1984    Family History  Problem Relation Age of Onset   Alcohol abuse Mother    Heart attack Father    Diabetes Maternal Grandfather    Pancreatic cancer Maternal Aunt    Colon cancer Neg Hx    Colon polyps Neg Hx    Esophageal cancer Neg Hx    Rectal cancer Neg Hx    Stomach cancer Neg Hx    Allergic rhinitis Neg Hx    Angioedema Neg Hx    Asthma Neg Hx    Atopy Neg Hx    Eczema Neg Hx    Immunodeficiency Neg Hx    Urticaria Neg Hx     Social History   Socioeconomic History   Marital status: Married    Spouse name: Not on file   Number of children: 4   Years of education: Not on file   Highest education level: Master's degree (e.g., MA, MS, MEng, MEd, MSW, MBA)  Occupational History   Occupation: Engineering geologist: [employer]  Tobacco Use   Smoking status: Never   Smokeless tobacco: Never  Vaping Use   Vaping Use: Never used  Substance  and Sexual Activity   Alcohol use: Yes    Alcohol/week: 1.0 standard drink    Types: 1 Standard drinks or equivalent per week    Comment: social   Drug use: No   Sexual activity: Yes    Birth control/protection: None  Other Topics Concern   Not on file  Social History Narrative   Not on file   Social Determinants of Health   Financial Resource Strain: Not on file  Food Insecurity: Not on file  Transportation Needs: Not on file  Physical Activity: Not on file  Stress: Not on file  Social Connections: Not on file  Intimate Partner Violence: Not on file    Outpatient Medications Prior to Visit  Medication Sig Dispense Refill   ciclesonide (ALVESCO) 160 MCG/ACT inhaler Inhale 2 puffs into the lungs 2 (two) times daily. 1 each 5   levothyroxine (SYNTHROID) 25 MCG tablet TAKE 1 TABLET BY MOUTH EVERY DAY BEFORE BREAKFAST 90 tablet 0   rivaroxaban (XARELTO) 20 MG TABS tablet Take 1 tablet (20 mg total) by mouth daily with supper. 30 tablet 4   rosuvastatin (CRESTOR) 5 MG tablet Take 1 tablet (5 mg total) by mouth  daily. 90 tablet 1   No facility-administered medications prior to visit.    No Known Allergies  ROS Review of Systems Review of Systems:  A fourteen system review of systems was performed and found to be positive as per HPI.   Objective:    Physical Exam General:  Well Developed, well nourished, appropriate for stated age.  Neuro:  Alert and oriented,  extra-ocular muscles intact  HEENT:  Normocephalic, atraumatic, neck supple, no carotid bruits appreciated  Skin:  no gross rash, warm, pink. Cardiac:  RRR, S1 S2 Respiratory:  CTA B/L, Not using accessory muscles, speaking in full sentences- unlabored. Ext/Vascular:  Ext warm, no cyanosis apprec.; nonpitting edema of left lower leg most prominent at calve when compared to right, no tenderness or erythema  Psych:  No HI/SI, judgement and insight good, Euthymic mood. Full Affect.  BP 101/67   Pulse 74   Temp 98.2  F (36.8 C)   Ht 6' (1.829 m)   Wt 234 lb 3.2 oz (106.2 kg)   SpO2 99%   BMI 31.76 kg/m  Wt Readings from Last 3 Encounters:  12/14/20 234 lb 3.2 oz (106.2 kg)  09/01/20 237 lb 8 oz (107.7 kg)  08/23/20 235 lb (106.6 kg)     Health Maintenance Due  Topic Date Due   COLONOSCOPY (Pts 45-49yrs Insurance coverage will need to be confirmed)  Never done   Zoster Vaccines- Shingrix (2 of 2) 06/09/2020   COVID-19 Vaccine (4 - Booster for Moderna series) 06/25/2020   INFLUENZA VACCINE  10/17/2020    There are no preventive care reminders to display for this patient.  Lab Results  Component Value Date   TSH 3.390 05/27/2020   Lab Results  Component Value Date   WBC 8.8 08/20/2020   HGB 14.5 08/20/2020   HCT 43.1 08/20/2020   MCV 90.9 08/20/2020   PLT 241 08/20/2020   Lab Results  Component Value Date   NA 139 08/20/2020   K 4.0 08/20/2020   CO2 27 08/20/2020   GLUCOSE 108 (H) 08/20/2020   BUN 19 08/20/2020   CREATININE 1.34 (H) 08/20/2020   BILITOT 0.4 05/27/2020   ALKPHOS 58 05/27/2020   AST 19 05/27/2020   ALT 32 05/27/2020   PROT 6.6 05/27/2020   ALBUMIN 4.2 05/27/2020   CALCIUM 8.7 (L) 08/20/2020   ANIONGAP 6 08/20/2020   EGFR 68 05/27/2020   Lab Results  Component Value Date   CHOL 144 05/27/2020   Lab Results  Component Value Date   HDL 49 05/27/2020   Lab Results  Component Value Date   LDLCALC 83 05/27/2020   Lab Results  Component Value Date   TRIG 56 05/27/2020   Lab Results  Component Value Date   CHOLHDL 2.9 05/27/2020   Lab Results  Component Value Date   HGBA1C 5.3 04/07/2020      Assessment & Plan:   Problem List Items Addressed This Visit       Endocrine   Subclinical hypothyroidism - Primary   Relevant Orders   TSH   T4, free   Other Visit Diagnoses     Deep vein thrombosis (DVT) of proximal vein of left lower extremity, unspecified chronicity (HCC)       Relevant Orders   VAS [place] LOWER EXTREMITY VENOUS (DVT)    Swelling of left lower extremity       Relevant Orders   Comp Met (CMET)   CBC w/Diff   VAS [place] LOWER EXTREMITY VENOUS (  DVT)      Swelling of left lower extremity, DVT of proximal vein of left lower extremity: -08/20/2020 venous ultrasound: IMPRESSION: Positive exam for presence of nonocclusive acute deep venous thrombosis involving the distal LEFT femoral, popliteal, and peroneal veins. -Patient continues to have left lower leg swelling and has been on anticoagulant therapy for 3 months, will repeat venous doppler ultrasound. -Recommend to continue with Xarelto 20 mg daily (recommend to continue for total of 6 months of therapy). -Will collect CBC and CMP to evaluate for electrolyte imbalance, infection or metabolic etiology contributing to symptoms.  Subclinical hypothyroidism: -Last TSH wnl -Continue current medication regimen. -Rechecking thyroid labs today. Pending results will make medication adjustments if indicated.      No orders of the defined types were placed in this encounter.   Follow-up: Return for as scheduled .    Lorrene Reid, PA-C

## 2020-12-15 ENCOUNTER — Ambulatory Visit (HOSPITAL_COMMUNITY)
Admission: RE | Admit: 2020-12-15 | Discharge: 2020-12-15 | Disposition: A | Discharge status: Home / Self Care | Payer: BC Managed Care – PPO | Source: Ambulatory Visit | Attending: Physician Assistant | Admitting: Physician Assistant

## 2020-12-15 DIAGNOSIS — I824Y2 Acute embolism and thrombosis of unspecified deep veins of left proximal lower extremity: Secondary | ICD-10-CM | POA: Diagnosis present

## 2020-12-15 DIAGNOSIS — M7989 Other specified soft tissue disorders: Secondary | ICD-10-CM | POA: Insufficient documentation

## 2020-12-15 LAB — COMPREHENSIVE METABOLIC PANEL
ALT: 32 IU/L (ref 0–44)
AST: 23 IU/L (ref 0–40)
Albumin/Globulin Ratio: 1.6 (ref 1.2–2.2)
Albumin: 4.4 g/dL (ref 3.8–4.9)
Alkaline Phosphatase: 55 IU/L (ref 44–121)
BUN/Creatinine Ratio: 16 (ref 9–20)
BUN: 20 mg/dL (ref 6–24)
Bilirubin Total: 0.2 mg/dL (ref 0.0–1.2)
CO2: 22 mmol/L (ref 20–29)
Calcium: 9 mg/dL (ref 8.7–10.2)
Chloride: 103 mmol/L (ref 96–106)
Creatinine, Ser: 1.27 mg/dL (ref 0.76–1.27)
Globulin, Total: 2.7 g/dL (ref 1.5–4.5)
Glucose: 96 mg/dL (ref 70–99)
Potassium: 4.6 mmol/L (ref 3.5–5.2)
Sodium: 139 mmol/L (ref 134–144)
Total Protein: 7.1 g/dL (ref 6.0–8.5)
eGFR: 66 mL/min/{1.73_m2} (ref >59)

## 2020-12-15 LAB — CBC WITH DIFFERENTIAL/PLATELET
Basophils Absolute: 0.1 10*3/uL (ref 0.0–0.2)
Basos: 1 %
EOS (ABSOLUTE): 0.2 10*3/uL (ref 0.0–0.4)
Eos: 2 %
Hematocrit: 44 % (ref 37.5–51.0)
Hemoglobin: 14.9 g/dL (ref 13.0–17.7)
Immature Grans (Abs): 0 10*3/uL (ref 0.0–0.1)
Immature Granulocytes: 0 %
Lymphocytes Absolute: 2.9 10*3/uL (ref 0.7–3.1)
Lymphs: 37 %
MCH: 30.8 pg (ref 26.6–33.0)
MCHC: 33.9 g/dL (ref 31.5–35.7)
MCV: 91 fL (ref 79–97)
Monocytes Absolute: 0.7 10*3/uL (ref 0.1–0.9)
Monocytes: 8 %
Neutrophils Absolute: 4.1 10*3/uL (ref 1.4–7.0)
Neutrophils: 52 %
Platelets: 263 10*3/uL (ref 150–450)
RBC: 4.83 x10E6/uL (ref 4.14–5.80)
RDW: 12.3 % (ref 11.6–15.4)
WBC: 7.8 10*3/uL (ref 3.4–10.8)

## 2020-12-15 LAB — T4, FREE: Free T4: 0.96 ng/dL (ref 0.82–1.77)

## 2020-12-15 LAB — TSH: TSH: 3.08 u[IU]/mL (ref 0.450–4.500)

## 2021-02-12 ENCOUNTER — Other Ambulatory Visit: Payer: Self-pay | Admitting: Cardiology

## 2021-02-13 NOTE — Telephone Encounter (Addendum)
Message sent to pharm D to see if pt should have a dose decrease of Xarelto. Discussed with Claiborne Billings D, pt is to see cardiologist on 12/2 and at that time can decide if pt needs a dose change or should stop.   Pt's last dose of Xarelto will be on 12/1- pt takes in the morning. Discussed with Dr Marlou Porch. Will refuse refill at this time and if he decides to continue Xarelto, he will send in refill on 12/2. Updated pt.

## 2021-02-13 NOTE — Telephone Encounter (Signed)
Prescription refill request for Xarelto received.  Indication: dvt, 08/23/2020 Last office visit: 08/23/2020, Marlou Porch Weight: 106.2 kg  Age: 55 yo  Scr: 1.27, 12/14/2020 CrCl: 96 ml/min

## 2021-02-17 ENCOUNTER — Other Ambulatory Visit: Payer: Self-pay

## 2021-02-17 ENCOUNTER — Encounter: Payer: Self-pay | Admitting: Cardiology

## 2021-02-17 ENCOUNTER — Ambulatory Visit: Payer: BC Managed Care – PPO | Admitting: Cardiology

## 2021-02-17 VITALS — BP 122/80 | HR 86 | Ht 71.5 in | Wt 243.4 lb

## 2021-02-17 DIAGNOSIS — R0602 Shortness of breath: Secondary | ICD-10-CM

## 2021-02-17 DIAGNOSIS — I82409 Acute embolism and thrombosis of unspecified deep veins of unspecified lower extremity: Secondary | ICD-10-CM | POA: Insufficient documentation

## 2021-02-17 DIAGNOSIS — I82493 Acute embolism and thrombosis of other specified deep vein of lower extremity, bilateral: Secondary | ICD-10-CM

## 2021-02-17 DIAGNOSIS — I824Y2 Acute embolism and thrombosis of unspecified deep veins of left proximal lower extremity: Secondary | ICD-10-CM | POA: Diagnosis not present

## 2021-02-17 DIAGNOSIS — E785 Hyperlipidemia, unspecified: Secondary | ICD-10-CM | POA: Diagnosis not present

## 2021-02-17 DIAGNOSIS — R002 Palpitations: Secondary | ICD-10-CM | POA: Insufficient documentation

## 2021-02-17 MED ORDER — RIVAROXABAN 20 MG PO TABS: 20.0000 mg | ORAL_TABLET | Freq: Every day | ORAL | 11 refills | Status: DC

## 2021-02-17 MED ORDER — ROSUVASTATIN CALCIUM 5 MG PO TABS: 5.0000 mg | ORAL_TABLET | Freq: Every day | ORAL | 3 refills | Status: DC

## 2021-02-17 NOTE — Patient Instructions (Addendum)
Medication Instructions:  Your physician recommends that you continue on your current medications as directed. Please refer to the Current Medication list given to you today.  *If you need a refill on your cardiac medications before your next appointment, please call your pharmacy*   Lab Work: None ordered  If you have labs (blood work) drawn today and your tests are completely normal, you will receive your results only by: [place] (if you have MyChart) OR A paper copy in the mail If you have any lab test that is abnormal or we need to change your treatment, we will call you to review the results.   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    Follow-Up: At [facility], you and your health needs are our priority.  As part of our continuing mission to provide you with exceptional heart care, we have created designated Provider Care Teams.  These Care Teams include your primary Cardiologist (physician) and Advanced Practice Providers (APPs -  Physician Assistants and Nurse Practitioners) who all work together to provide you with the care you need, when you need it.  We recommend signing up for the patient portal called "MyChart".  Sign up information is provided on this After Visit Summary.  MyChart is used to connect with patients for Virtual Visits (Telemedicine).  Patients are able to view lab/test results, encounter notes, upcoming appointments, etc.  Non-urgent messages can be sent to your provider as well.   To learn more about what you can do with MyChart, go to [email or web address].    Your next appointment:   12 month(s)  The format for your next appointment:   In Person  Provider:   None     Other Instructions

## 2021-02-17 NOTE — Assessment & Plan Note (Signed)
>>  ASSESSMENT AND PLAN FOR DVT (DEEP VENOUS THROMBOSIS) ([place]) WRITTEN ON 02/17/2021  8:45 AM BY SKAINS, MARK C, MD  Left-sided DVT still present on ultrasound in September.  He is continue with Xarelto for duration of 6 months currently.  Given his continuation of symptoms as well as findings on ultrasound, we will go ahead and refer him to [facility], vascular to discuss.  Overall reassurance has been given to him today.  We have given him a refill on his Xarelto.

## 2021-02-17 NOTE — Assessment & Plan Note (Signed)
Left-sided DVT still present on ultrasound in September.  He is continue with Xarelto for duration of 6 months currently.  Given his continuation of symptoms as well as findings on ultrasound, we will go ahead and refer him to [facility], vascular to discuss.  Overall reassurance has been given to him today.  We have given him a refill on his Xarelto.

## 2021-02-17 NOTE — Assessment & Plan Note (Signed)
No current issue.  Doing well.

## 2021-02-17 NOTE — Progress Notes (Signed)
Cardiology Office Note:    Date:  02/17/2021   ID:  Ryan [name], DOB 09-20-1965, MRN [id number]  PCP:  Lorrene Reid, PA-C   [facility] Providers Cardiologist:  None     Referring MD: Lorrene Reid, PA-C    History of Present Illness:    Ryan [name] is a 54 y.o. male here for the follow-up of palpitations prior DVT.  Previous ER note reviewed, DVT was found in his left leg minimal swelling.  Prescribe Xarelto normal routine.  May 18 had COVID.  No chest pain fevers chills nausea vomiting syncope bleeding.  Still having some symptoms with his left leg DVT.  Mild swelling.  He is wearing compression socks currently appearing if he wears low-cut socks he will see some pitting.  No major discomfort.  He had an ultrasound performed 12/15/2020 which showed the following:  RIGHT:  - No evidence of common femoral vein obstruction.     LEFT:  - Findings consistent with age indeterminate deep vein thrombosis  involving the left femoral vein, left popliteal vein, and left peroneal  veins.     Findings appear essentially unchanged compared to previous examination.     - There is no evidence of superficial venous thrombosis.     - No cystic structure found in the popliteal fossa.   Past Medical History:  Diagnosis Date   Allergy    mild   Asthma    athletic induced asthma age 36    Bronchitis    with viral infection nov/Dec 2019   COVID-19    Deviated septum    Nephritis 1972    Past Surgical History:  Procedure Laterality Date   APPENDECTOMY  1984    Current Medications: Current Meds  Medication Sig   levothyroxine (SYNTHROID) 25 MCG tablet TAKE 1 TABLET BY MOUTH EVERY DAY BEFORE BREAKFAST   [DISCONTINUED] rivaroxaban (XARELTO) 20 MG TABS tablet Take 1 tablet (20 mg total) by mouth daily with supper.   [DISCONTINUED] rosuvastatin (CRESTOR) 5 MG tablet Take 1 tablet (5 mg total) by mouth daily.     Allergies:   Patient has no known allergies.   Social  History   Socioeconomic History   Marital status: Married    Spouse name: Not on file   Number of children: 4   Years of education: Not on file   Highest education level: Master's degree (e.g., MA, MS, MEng, MEd, MSW, MBA)  Occupational History   Occupation: Engineer, agricultural: [employer]  Tobacco Use   Smoking status: Never   Smokeless tobacco: Never  Vaping Use   Vaping Use: Never used  Substance and Sexual Activity   Alcohol use: Yes    Alcohol/week: 1.0 standard drink    Types: 1 Standard drinks or equivalent per week    Comment: social   Drug use: No   Sexual activity: Yes    Birth control/protection: None  Other Topics Concern   Not on file  Social History Narrative   Not on file   Social Determinants of Health   Financial Resource Strain: Not on file  Food Insecurity: Not on file  Transportation Needs: Not on file  Physical Activity: Not on file  Stress: Not on file  Social Connections: Not on file     Family History: The patient's family history includes Alcohol abuse in his mother; Diabetes in his maternal grandfather; Heart attack in his father; Pancreatic cancer in his maternal aunt. There  is no history of Colon cancer, Colon polyps, Esophageal cancer, Rectal cancer, Stomach cancer, Allergic rhinitis, Angioedema, Asthma, Atopy, Eczema, Immunodeficiency, or Urticaria.  ROS:   Please see the history of present illness.     All other systems reviewed and are negative.  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  EKG:  EKG is  ordered today.  The ekg ordered today demonstrates sinus rhythm 86 no other abnormalities.  Recent Labs: 12/14/2020: ALT 32; BUN 20; Creatinine, Ser 1.27; Hemoglobin 14.9; Platelets 263; Potassium 4.6; Sodium 139; TSH 3.080  Recent Lipid Panel    Component  Value Date/Time   CHOL 144 05/27/2020 0838   TRIG 56 05/27/2020 0838   HDL 49 05/27/2020 0838   CHOLHDL 2.9 05/27/2020 0838   LDLCALC 83 05/27/2020 0838        Physical Exam:    VS:  BP 122/80   Pulse 86   Ht 5' 11.5" (1.816 m)   Wt 243 lb 6.4 oz (110.4 kg)   SpO2 97%   BMI 33.47 kg/m     Wt Readings from Last 3 Encounters:  02/17/21 243 lb 6.4 oz (110.4 kg)  12/14/20 234 lb 3.2 oz (106.2 kg)  09/01/20 237 lb 8 oz (107.7 kg)     GEN:  Well nourished, well developed in no acute distress HEENT: Normal NECK: No JVD; No carotid bruits LYMPHATICS: No lymphadenopathy CARDIAC: RRR, no murmurs, no rubs, gallops RESPIRATORY:  Clear to auscultation without rales, wheezing or rhonchi  ABDOMEN: Soft, non-tender, non-distended MUSCULOSKELETAL: Left mid calf 41 cm, right 38 cm.,  Wearing compression hose on left SKIN: Warm and dry NEUROLOGIC:  Alert and oriented x 3 PSYCHIATRIC:  Normal affect   ASSESSMENT:    1. Palpitations   2. Shortness of breath   3. Deep vein thrombosis (DVT) of proximal vein of left lower extremity, unspecified chronicity ([place])   4. Hyperlipidemia, unspecified hyperlipidemia type   5. 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  He is continue with Xarelto for duration of 6 months currently.  Given his continuation of symptoms as well as findings on ultrasound, we will go ahead and refer him to [facility], vascular to discuss.  Overall reassurance has been given to him today.  We have given him a refill on his Xarelto.  Palpitations No current issue.  Doing well.  Hyperlipidemia Taking low-dose Crestor 5 mg once a day.  No myalgias. LDL 83       Medication Adjustments/Labs and Tests Ordered: Current medicines are reviewed at length with the patient today.   Concerns regarding medicines are outlined above.  Orders Placed This Encounter  Procedures   Ambulatory referral to Vascular Surgery   EKG 12-Lead   Meds ordered this encounter  Medications   rosuvastatin (CRESTOR) 5 MG tablet    Sig: Take 1 tablet (5 mg total) by mouth daily.    Dispense:  90 tablet    Refill:  3   rivaroxaban (XARELTO) 20 MG TABS tablet    Sig: Take 1 tablet (20 mg total) by mouth daily with supper.    Dispense:  30 tablet    Refill:  11  Patient Instructions  Medication Instructions:  Your physician recommends that you continue on your current medications as directed. Please refer to the Current Medication list given to you today.  *If you need a refill on your cardiac medications before your next appointment, please call your pharmacy*   Lab Work: None ordered  If you have labs (blood work) drawn today and your tests are completely normal, you will receive your results only by: [place] (if you have MyChart) OR A paper copy in the mail If you have any lab test that is abnormal or we need to change your treatment, we will call you to review the results.   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    Follow-Up: At [facility], you and your health needs are our priority.  As part of our continuing mission to provide you with exceptional heart care, we have created designated Provider Care Teams.  These Care Teams include your primary Cardiologist (physician) and Advanced Practice Providers (APPs -  Physician Assistants and Nurse Practitioners) who all work together to provide you with the care you need, when you need it.  We recommend signing up for the patient portal called "MyChart".  Sign up information is provided on this After Visit Summary.  MyChart is used to connect with patients for Virtual Visits (Telemedicine).  Patients are able to view lab/test results, encounter  notes, upcoming appointments, etc.  Non-urgent messages can be sent to your provider as well.   To learn more about what you can do with MyChart, go to [email or web address].    Your next appointment:   12 month(s)  The format for your next appointment:   In Person  Provider:   None     Other Instructions    Signed, Candee Furbish, MD  02/17/2021 8:46 AM    [place]

## 2021-02-17 NOTE — Assessment & Plan Note (Signed)
Taking low-dose Crestor 5 mg once a day.  No myalgias.

## 2021-03-15 ENCOUNTER — Other Ambulatory Visit: Payer: Self-pay

## 2021-03-15 DIAGNOSIS — I872 Venous insufficiency (chronic) (peripheral): Secondary | ICD-10-CM

## 2021-03-16 ENCOUNTER — Other Ambulatory Visit: Payer: Self-pay | Admitting: Physician Assistant

## 2021-03-16 DIAGNOSIS — E038 Other specified hypothyroidism: Secondary | ICD-10-CM

## 2021-03-28 ENCOUNTER — Encounter (HOSPITAL_COMMUNITY): Payer: BC Managed Care – PPO

## 2021-03-28 ENCOUNTER — Encounter: Payer: BC Managed Care – PPO | Admitting: Vascular Surgery

## 2021-04-11 NOTE — Progress Notes (Deleted)
Complete physical exam   Patient: Ryan [name]   DOB: [birth year]   56 y.o. Male  MRN: [id number] Visit Date: 04/12/2021   No chief complaint on file.  Subjective    Ryan [name] is a 56 y.o. male who presents today for a complete physical exam.  He reports consuming a {diet types:17450} diet. {Exercise:19826} He generally feels {well/fairly well/poorly:18703}. He {does/does not:200015} have additional problems to discuss today.   ***  Past Medical History:  Diagnosis Date   Allergy    mild   Asthma    athletic induced asthma age 74    Bronchitis    with viral infection nov/Dec 2019   COVID-19    Deviated septum    Nephritis 1972   Past Surgical History:  Procedure Laterality Date   APPENDECTOMY  1984   Social History   Socioeconomic History   Marital status: Married    Spouse name: Not on file   Number of children: 4   Years of education: Not on file   Highest education level: Master's degree (e.g., MA, MS, MEng, MEd, MSW, MBA)  Occupational History   Occupation: Engineer, agricultural: [employer]  Tobacco Use   Smoking status: Never   Smokeless tobacco: Never  Vaping Use   Vaping Use: Never used  Substance and Sexual Activity   Alcohol use: Yes    Alcohol/week: 1.0 standard drink    Types: 1 Standard drinks or equivalent per week    Comment: social   Drug use: No   Sexual activity: Yes    Birth control/protection: None  Other Topics Concern   Not on file  Social History Narrative   Not on file   Social Determinants of Health   Financial Resource Strain: Not on file  Food Insecurity: Not on file  Transportation Needs: Not on file  Physical Activity: Not on file  Stress: Not on file  Social Connections: Not on file  Intimate Partner Violence: Not on file     Medications: Outpatient Medications Prior to Visit  Medication Sig   ciclesonide (ALVESCO) 160 MCG/ACT inhaler Inhale 2 puffs into the lungs 2 (two)  times daily. (Patient not taking: Reported on 02/17/2021)   levothyroxine (SYNTHROID) 25 MCG tablet TAKE 1 TABLET BY MOUTH EVERY DAY BEFORE BREAKFAST   rivaroxaban (XARELTO) 20 MG TABS tablet Take 1 tablet (20 mg total) by mouth daily with supper.   rosuvastatin (CRESTOR) 5 MG tablet Take 1 tablet (5 mg total) by mouth daily.   No facility-administered medications prior to visit.    Review of Systems  {Labs   Heme   Chem   Endocrine   Serology   Results Review (optional):23779}  Objective    There were no vitals taken for this visit. {Show previous vital signs (optional):23777}  Physical Exam  ***  Last depression screening scores PHQ 2/9 Scores 12/14/2020 09/01/2020 07/15/2020  PHQ - 2 Score 0 0 0  PHQ- 9 Score 0 0 0   Last fall risk screening Fall Risk  12/14/2020  Falls in the past year? 0  Number falls in past yr: 0  Injury with Fall? 0  Risk for fall due to : No Fall Risks  Follow up Falls evaluation completed   Last Audit-C alcohol use screening No flowsheet data found. A score of 3 or more in women, and 4 or more in men indicates increased risk for alcohol abuse, EXCEPT if all of the points  are from question 1   No results found for any visits on 04/12/21.  Assessment & Plan    Routine Health Maintenance and Physical Exam  Exercise Activities and Dietary recommendations  Goals   None     Immunization History  Administered Date(s) Administered   Hepatitis B, ped/adol 05/14/2012, 06/18/2012   Influenza,inj,Quad PF,6+ Mos 03/02/2019   Influenza-Unspecified 02/25/2020   Moderna Sars-Covid-2 Vaccination 05/16/2019, 06/13/2019, 02/25/2020   Tdap 03/20/2009, 04/14/2020   Zoster Recombinat (Shingrix) 04/14/2020    Health Maintenance  Topic Date Due   COLONOSCOPY (Pts 45-76yrs Insurance coverage will need to be confirmed)  Never done   COVID-19 Vaccine (4 - Booster for Moderna series) 04/21/2020   Zoster Vaccines- Shingrix (2 of 2) 06/09/2020   INFLUENZA VACCINE   10/17/2020   TETANUS/TDAP  04/14/2030   Hepatitis C Screening  Completed   HIV Screening  Completed   HPV VACCINES  Aged Out    Discussed health benefits of physical activity, and encouraged him to engage in regular exercise appropriate for his age and condition.  Problem List Items Addressed This Visit   None    No follow-ups on file.     {provider attestation***:1}   Lorrene Reid, PA-C  [place] [facility] [phone] (phone) [phone] (fax)  [place]

## 2021-04-12 ENCOUNTER — Encounter: Payer: BC Managed Care – PPO | Admitting: Physician Assistant

## 2021-04-12 NOTE — Progress Notes (Signed)
Office Note    CC: History of left lower extremity femoral vein, popliteal vein deep venous thrombosis Requesting Provider:  Lorrene Reid, PA-C  HPI: Ryan [name] is a 56 y.o. (Jul 30, 1965) male presenting at the request of Dr. Candee Furbish for follow-up of left lower extremity DVT.  In June of last year, Ryan [name] appreciated left lower extremity swelling and was diagnosed with nonocclusive femoral vein, popliteal, peroneal vein deep venous thrombosis.  He was started on anticoagulation and has improved significantly since that time.  The etiology of his thrombotic event was believed to be COVID-related.  Since that time, Ryan [name] has been doing well.  He is continued on Xarelto.  Ryan [name] is very active and 56 years old, golfing regularly.  He can walk 18 holes without any pain in his lower extremities.  He denies claudication, ischemic rest pain, tissue loss.  He has not appreciated left leg heaviness, or other symptoms associated with post thrombotic syndrome.  The pt is  on a statin for cholesterol management.  The pt is - on a daily aspirin.   Other AC:  xarelto The pt is not on medication for hypertension.   The pt is not diabetic.  Tobacco hx:  -  Past Medical History:  Diagnosis Date   Allergy    mild   Asthma    athletic induced asthma age 38    Bronchitis    with viral infection nov/Dec 2019   COVID-19    Deviated septum    Nephritis 1972    Past Surgical History:  Procedure Laterality Date   APPENDECTOMY  1984    Social History   Socioeconomic History   Marital status: Married    Spouse name: Not on file   Number of children: 4   Years of education: Not on file   Highest education level: Master's degree (e.g., MA, MS, MEng, MEd, MSW, MBA)  Occupational History   Occupation: Engineer, agricultural: [employer]  Tobacco Use   Smoking status: Never   Smokeless tobacco: Never  Vaping Use   Vaping Use: Never used  Substance and  Sexual Activity   Alcohol use: Yes    Alcohol/week: 1.0 standard drink    Types: 1 Standard drinks or equivalent per week    Comment: social   Drug use: No   Sexual activity: Yes    Birth control/protection: None  Other Topics Concern   Not on file  Social History Narrative   Not on file   Social Determinants of Health   Financial Resource Strain: Not on file  Food Insecurity: Not on file  Transportation Needs: Not on file  Physical Activity: Not on file  Stress: Not on file  Social Connections: Not on file  Intimate Partner Violence: Not on file    Family History  Problem Relation Age of Onset   Alcohol abuse Mother    Heart attack Father    Diabetes Maternal Grandfather    Pancreatic cancer Maternal Aunt    Colon cancer Neg Hx    Colon polyps Neg Hx    Esophageal cancer Neg Hx    Rectal cancer Neg Hx    Stomach cancer Neg Hx    Allergic rhinitis Neg Hx    Angioedema Neg Hx    Asthma Neg Hx    Atopy Neg Hx    Eczema Neg Hx    Immunodeficiency Neg Hx    Urticaria Neg Hx     Current  Outpatient Medications  Medication Sig Dispense Refill   ciclesonide (ALVESCO) 160 MCG/ACT inhaler Inhale 2 puffs into the lungs 2 (two) times daily. (Patient not taking: Reported on 02/17/2021) 1 each 5   levothyroxine (SYNTHROID) 25 MCG tablet TAKE 1 TABLET BY MOUTH EVERY DAY BEFORE BREAKFAST 90 tablet 1   rivaroxaban (XARELTO) 20 MG TABS tablet Take 1 tablet (20 mg total) by mouth daily with supper. 30 tablet 11   rosuvastatin (CRESTOR) 5 MG tablet Take 1 tablet (5 mg total) by mouth daily. 90 tablet 3   No current facility-administered medications for this visit.    No Known Allergies   REVIEW OF SYSTEMS:   [X]  denotes positive finding, [ ]  denotes negative finding Cardiac  Comments:  Chest pain or chest pressure:    Shortness of breath upon exertion:    Short of breath when lying flat:    Irregular heart rhythm:        Vascular    Pain in calf, thigh, or hip brought on  by ambulation:    Pain in feet at night that wakes you up from your sleep:     Blood clot in your veins:    Leg swelling:         Pulmonary    Oxygen at home:    Productive cough:     Wheezing:         Neurologic    Sudden weakness in arms or legs:     Sudden numbness in arms or legs:     Sudden onset of difficulty speaking or slurred speech:    Temporary loss of vision in one eye:     Problems with dizziness:         Gastrointestinal    Blood in stool:     Vomited blood:         Genitourinary    Burning when urinating:     Blood in urine:        Psychiatric    Major depression:         Hematologic    Bleeding problems:    Problems with blood clotting too easily:        Skin    Rashes or ulcers:        Constitutional    Fever or chills:      PHYSICAL EXAMINATION:  There were no vitals filed for this visit.  General:  WDWN in NAD; vital signs documented above Gait: Not observed HENT: WNL, normocephalic Pulmonary: normal non-labored breathing , without wheezing Cardiac: regular HR,  Abdomen: soft, NT, no masses Skin: without rashes Vascular Exam/Pulses:  Right Left  Radial 2+ (normal) 2+ (normal)  Ulnar 2+ (normal) 2+ (normal)  Femoral    Popliteal    DP 2+ (normal) 2+ (normal)  PT 2+ (normal) 2+ (normal)   Extremities: without ischemic changes, without Gangrene , without cellulitis; without open wounds;  Musculoskeletal: no muscle wasting or atrophy  Neurologic: A&O X 3;  No focal weakness or paresthesias are detected Psychiatric:  The pt has Normal affect.   Non-Invasive Vascular Imaging:    08/20/2020: Lt- Acute deep vein thrombosis noted in the  distal femoral vein, popliteal vein, and peroneal veins.   12/15/20: 08/20/2020: Lt- Acute deep vein thrombosis noted in the  distal femoral vein, popliteal vein, and peroneal veins.   Summary:  Left:  - No evidence of superficial venous thrombosis in the left lower  extremity.  - Continued  age-indeterminate thrombosis involving the mid  femoral vein,  distal femoral vein, popliteal vein and peroneal veins.  Mixed arterial flow appreciated in the pop and distal femoral vein however this is no appreciated in the common femoral vein.  - The great saphenous vein is competent.  - The small saphenous vein is competent.    ASSESSMENT/PLAN: Ryan [name] is a 56 y.o. male presenting with a 66-month history of left lower extremity DVT.  His new duplex ultrasound was reviewed demonstrating continued, nonocclusive thrombus in the mid femoral vein, popliteal vein, peroneal veins.  Waveforms in the area of insonation also demonstrated some pulsatility.  AV fistula from deep venous thrombosis is rare.  Furthermore, the pulsatility is not appreciated in the common femoral vein, which is what I would expect.  Regardless, I will see Ryan [name] in 3 months for repeat ultrasound.  If pulsatile waveforms are still present, we will pursue CT angiogram.  In the interim, I am going to have him see hematology oncology.  The presumptive etiology for his thrombosis was COVID.  I would appreciate their expertise on whether anticoagulation is needed at this point as it appears to be a provoked DVT.  I asked that he continue his current medication regimens until that time.  He was asked to call my office should any questions or concerns arise.   Ryan John, MD [facility] [phone]

## 2021-04-14 ENCOUNTER — Ambulatory Visit (HOSPITAL_COMMUNITY)
Admission: RE | Admit: 2021-04-14 | Discharge: 2021-04-14 | Disposition: A | Discharge status: Home / Self Care | Payer: BC Managed Care – PPO | Source: Ambulatory Visit | Attending: Vascular Surgery | Admitting: Vascular Surgery

## 2021-04-14 ENCOUNTER — Ambulatory Visit: Payer: BC Managed Care – PPO | Admitting: Vascular Surgery

## 2021-04-14 ENCOUNTER — Other Ambulatory Visit: Payer: Self-pay

## 2021-04-14 ENCOUNTER — Encounter: Payer: Self-pay | Admitting: Vascular Surgery

## 2021-04-14 VITALS — BP 128/84 | HR 83 | Temp 98.3°F | Resp 20 | Ht 71.5 in | Wt 245.0 lb

## 2021-04-14 DIAGNOSIS — I872 Venous insufficiency (chronic) (peripheral): Secondary | ICD-10-CM | POA: Insufficient documentation

## 2021-04-14 DIAGNOSIS — I82552 Chronic embolism and thrombosis of left peroneal vein: Secondary | ICD-10-CM

## 2021-04-18 ENCOUNTER — Other Ambulatory Visit: Payer: Self-pay | Admitting: *Deleted

## 2021-04-18 DIAGNOSIS — I872 Venous insufficiency (chronic) (peripheral): Secondary | ICD-10-CM

## 2021-04-18 DIAGNOSIS — I82552 Chronic embolism and thrombosis of left peroneal vein: Secondary | ICD-10-CM

## 2021-04-21 ENCOUNTER — Telehealth: Payer: Self-pay | Admitting: Physician Assistant

## 2021-04-21 NOTE — Telephone Encounter (Signed)
Scheduled appt per 1/31 referral. Pt is aware of appt date and time. Pt is aware of appt date and time Pt is aware to arrive 15 mins prior to appt time. I did offer pt an earlier date, however pt was not able to make earlier appt offered.

## 2021-05-17 ENCOUNTER — Inpatient Hospital Stay: Payer: BC Managed Care – PPO | Attending: Physician Assistant | Admitting: Physician Assistant

## 2021-05-17 ENCOUNTER — Inpatient Hospital Stay: Payer: BC Managed Care – PPO

## 2021-05-17 ENCOUNTER — Other Ambulatory Visit: Payer: Self-pay

## 2021-05-17 ENCOUNTER — Encounter: Payer: Self-pay | Admitting: Physician Assistant

## 2021-05-17 VITALS — BP 114/75 | HR 80 | Temp 97.7°F | Resp 20 | Wt 238.4 lb

## 2021-05-17 DIAGNOSIS — I82493 Acute embolism and thrombosis of other specified deep vein of lower extremity, bilateral: Secondary | ICD-10-CM

## 2021-05-17 DIAGNOSIS — Z79899 Other long term (current) drug therapy: Secondary | ICD-10-CM | POA: Insufficient documentation

## 2021-05-17 DIAGNOSIS — Z86718 Personal history of other venous thrombosis and embolism: Secondary | ICD-10-CM | POA: Diagnosis present

## 2021-05-17 DIAGNOSIS — E785 Hyperlipidemia, unspecified: Secondary | ICD-10-CM | POA: Diagnosis not present

## 2021-05-17 DIAGNOSIS — Z7901 Long term (current) use of anticoagulants: Secondary | ICD-10-CM | POA: Insufficient documentation

## 2021-05-17 LAB — CMP (CANCER CENTER ONLY)
ALT: 37 U/L (ref 0–44)
AST: 23 U/L (ref 15–41)
Albumin: 4.1 g/dL (ref 3.5–5.0)
Alkaline Phosphatase: 50 U/L (ref 38–126)
Anion gap: 5 (ref 5–15)
BUN: 22 mg/dL — ABNORMAL HIGH (ref 6–20)
CO2: 26 mmol/L (ref 22–32)
Calcium: 8.8 mg/dL — ABNORMAL LOW (ref 8.9–10.3)
Chloride: 107 mmol/L (ref 98–111)
Creatinine: 1.33 mg/dL — ABNORMAL HIGH (ref 0.61–1.24)
GFR, Estimated: >60 mL/min (ref >60)
Glucose, Bld: 86 mg/dL (ref 70–99)
Potassium: 3.8 mmol/L (ref 3.5–5.1)
Sodium: 138 mmol/L (ref 135–145)
Total Bilirubin: 0.5 mg/dL (ref 0.3–1.2)
Total Protein: 7.2 g/dL (ref 6.5–8.1)

## 2021-05-17 LAB — CBC WITH DIFFERENTIAL (CANCER CENTER ONLY)
Abs Immature Granulocytes: 0.02 10*3/uL (ref 0.00–0.07)
Basophils Absolute: 0.1 10*3/uL (ref 0.0–0.1)
Basophils Relative: 1 %
Eosinophils Absolute: 0.1 10*3/uL (ref 0.0–0.5)
Eosinophils Relative: 2 %
HCT: 42.9 % (ref 39.0–52.0)
Hemoglobin: 14.7 g/dL (ref 13.0–17.0)
Immature Granulocytes: 0 %
Lymphocytes Relative: 34 %
Lymphs Abs: 2.3 10*3/uL (ref 0.7–4.0)
MCH: 30.9 pg (ref 26.0–34.0)
MCHC: 34.3 g/dL (ref 30.0–36.0)
MCV: 90.3 fL (ref 80.0–100.0)
Monocytes Absolute: 0.6 10*3/uL (ref 0.1–1.0)
Monocytes Relative: 9 %
Neutro Abs: 3.6 10*3/uL (ref 1.7–7.7)
Neutrophils Relative %: 54 %
Platelet Count: 228 10*3/uL (ref 150–400)
RBC: 4.75 MIL/uL (ref 4.22–5.81)
RDW: 13 % (ref 11.5–15.5)
WBC Count: 6.6 10*3/uL (ref 4.0–10.5)
nRBC: 0 % (ref 0.0–0.2)

## 2021-05-17 NOTE — Progress Notes (Signed)
[place] Telephone:([phone]   Fax:([phone]) [place] NOTE  Patient Care Team: Lorrene Reid, PA-C as PCP - General Jerline Pain, MD as PCP - Cardiology (Cardiology) Vicenta Aly, FNP as Nurse Practitioner (Nurse Practitioner)  Hematological/Oncological History 1) 08/20/2020: Presented to the emergency room for left lower leg swelling x1 day. Doppler US revealed nonocclusive acute DVT involving the distal left femoral, popliteal and peroneal veins.  Patient reports recent diagnosis of COVID and DVT was felt to be provoked by this.  Patient initiated anticoagulation with Xarelto.  2) 12/15/2020: Doppler ultrasound revealed age-indeterminate DVT involving the left femoral vein, left popliteal vein and left peroneal vein.  Findings appeared essentially unchanged compared to the previous examination  3) 04/14/2021: Doppler ultrasound revealed continued age-indeterminate thrombosis involving the mid femoral vein, distal femoral vein, popliteal vein and peroneal veins.  4) 05/17/2021: Establish care with [facility] Hematology/Oncology  CHIEF COMPLAINTS/PURPOSE OF CONSULTATION:  Hx of left lower leg DVT  HISTORY OF PRESENTING ILLNESS:  Ryan [name] 56 y.o. male with medical history significant for hyperlipidemia, allergies and history of lower leg DVT.  He is unaccompanied for this visit.  On exam today, Ryan [name] reports that his energy levels are stable.  He is able to complete all his daily activities on his own.  He is very active and golfs regularly.  He has a good appetite without any noticeable weight changes.  He denies any nausea, vomiting or abdominal pain.  His bowel habits are regular without any diarrhea or constipation.  He denies easy bruising or signs of active bleeding.  Patient reports that since his diagnosis of the left lower leg DVT, swelling has mildly improved and there is no evidence of pain.  He is compliant with taking Xarelto once a  day as prescribed.  Patient denies any fevers, chills, night sweats, shortness of breath, chest pain or cough.  He has no other complaints.  Rest of the 10 point ROS is below  MEDICAL HISTORY:  Past Medical History:  Diagnosis Date   Allergy    mild   Asthma    athletic induced asthma age 67    Bronchitis    with viral infection nov/Dec 2019   COVID-19    Deviated septum    DVT (deep venous thrombosis) (HCC)    Hyperlipidemia    Nephritis 1972    SURGICAL HISTORY: Past Surgical History:  Procedure Laterality Date   APPENDECTOMY  1984    SOCIAL HISTORY: Social History   Socioeconomic History   Marital status: Married    Spouse name: Not on file   Number of children: 4   Years of education: Not on file   Highest education level: Master's degree (e.g., MA, MS, MEng, MEd, MSW, MBA)  Occupational History   Occupation: Engineer, agricultural: [employer]  Tobacco Use   Smoking status: Never   Smokeless tobacco: Never  Vaping Use   Vaping Use: Never used  Substance and Sexual Activity   Alcohol use: Yes    Alcohol/week: 1.0 standard drink    Types: 1 Standard drinks or equivalent per week    Comment: social   Drug use: No   Sexual activity: Yes    Birth control/protection: None  Other Topics Concern   Not on file  Social History Narrative   Not on file   Social Determinants of Health   Financial Resource Strain: Not on file  Food Insecurity: Not  on file  Transportation Needs: Not on file  Physical Activity: Not on file  Stress: Not on file  Social Connections: Not on file  Intimate Partner Violence: Not on file    FAMILY HISTORY: Family History  Problem Relation Age of Onset   Alcohol abuse Mother    Heart attack Father    Diabetes Maternal Grandfather    Pancreatic cancer Maternal Aunt    Colon cancer Neg Hx    Colon polyps Neg Hx    Esophageal cancer Neg Hx    Rectal cancer Neg Hx    Stomach cancer Neg Hx    Allergic  rhinitis Neg Hx    Angioedema Neg Hx    Asthma Neg Hx    Atopy Neg Hx    Eczema Neg Hx    Immunodeficiency Neg Hx    Urticaria Neg Hx     ALLERGIES:  has No Known Allergies.  MEDICATIONS:  Current Outpatient Medications  Medication Sig Dispense Refill   levothyroxine (SYNTHROID) 25 MCG tablet TAKE 1 TABLET BY MOUTH EVERY DAY BEFORE BREAKFAST 90 tablet 1   [START ON 06/12/2021] rivaroxaban (XARELTO) 10 MG TABS tablet Take 1 tablet (10 mg total) by mouth daily. 30 tablet 6   rosuvastatin (CRESTOR) 5 MG tablet Take 1 tablet (5 mg total) by mouth daily. 90 tablet 3   ciclesonide (ALVESCO) 160 MCG/ACT inhaler Inhale 2 puffs into the lungs 2 (two) times daily. (Patient not taking: Reported on 02/17/2021) 1 each 5   No current facility-administered medications for this visit.    REVIEW OF SYSTEMS:   Constitutional: ( - ) fevers, ( - )  chills , ( - ) night sweats Eyes: ( - ) blurriness of vision, ( - ) double vision, ( - ) watery eyes Ears, nose, mouth, throat, and face: ( - ) mucositis, ( - ) sore throat Respiratory: ( - ) cough, ( - ) dyspnea, ( - ) wheezes Cardiovascular: ( - ) palpitation, ( - ) chest discomfort, ( + ) lower extremity swelling Gastrointestinal:  ( - ) nausea, ( - ) heartburn, ( - ) change in bowel habits Skin: ( - ) abnormal skin rashes Lymphatics: ( - ) new lymphadenopathy, ( - ) easy bruising Neurological: ( - ) numbness, ( - ) tingling, ( - ) new weaknesses Behavioral/Psych: ( - ) mood change, ( - ) new changes  All other systems were reviewed with the patient and are negative.  PHYSICAL EXAMINATION: ECOG PERFORMANCE STATUS: 1 - Symptomatic but completely ambulatory  Vitals:   05/17/21 1415  BP: 114/75  Pulse: 80  Resp: 20  Temp: 97.7 F (36.5 C)  SpO2: 99%   Filed Weights   05/17/21 1415  Weight: 238 lb 6.4 oz (108.1 kg)    GENERAL: well appearing male in NAD  SKIN: skin color, texture, turgor are normal, no rashes or significant lesions EYES:  conjunctiva are pink and non-injected, sclera clear OROPHARYNX: no exudate, no erythema; lips, buccal mucosa, and tongue normal  NECK: supple, non-tender LUNGS: clear to auscultation and percussion with normal breathing effort HEART: regular rate & rhythm and no murmurs. Left lower extremity edema without tenderness or erythema.  ABDOMEN: soft, non-tender, non-distended, normal bowel sounds Musculoskeletal: no cyanosis of digits and no clubbing  PSYCH: alert & oriented x 3, fluent speech NEURO: no focal motor/sensory deficits  LABORATORY DATA:  I have reviewed the data as listed CBC Latest Ref Rng & Units 05/17/2021 12/14/2020 08/20/2020  WBC 4.0 -  10.5 K/uL 6.6 7.8 8.8  Hemoglobin 13.0 - 17.0 g/dL 14.7 14.9 14.5  Hematocrit 39.0 - 52.0 % 42.9 44.0 43.1  Platelets 150 - 400 K/uL 228 263 241    CMP Latest Ref Rng & Units 05/17/2021 12/14/2020 08/20/2020  Glucose 70 - 99 mg/dL 86 96 108(H)  BUN 6 - 20 mg/dL 22(H) 20 19  Creatinine 0.61 - 1.24 mg/dL 1.33(H) 1.27 1.34(H)  Sodium 135 - 145 mmol/L 138 139 139  Potassium 3.5 - 5.1 mmol/L 3.8 4.6 4.0  Chloride 98 - 111 mmol/L 107 103 106  CO2 22 - 32 mmol/L 26 22 27   Calcium 8.9 - 10.3 mg/dL 8.8(L) 9.0 8.7(L)  Total Protein 6.5 - 8.1 g/dL 7.2 7.1 -  Total Bilirubin 0.3 - 1.2 mg/dL 0.5 0.2 -  Alkaline Phos 38 - 126 U/L 50 55 -  AST 15 - 41 U/L 23 23 -  ALT 0 - 44 U/L 37 32 -    RADIOGRAPHIC STUDIES: I have personally reviewed the radiological images as listed and agreed with the findings in the report. No results found.  ASSESSMENT & PLAN Ryan [name] is a 56 y.o. male who presents to the clinic for evaluation for history of left lower extremity DVT that was diagnosed in June 2022.  Patient is currently on Xarelto 20 mg once a day.  We reviewed provoking etiologies that can cause thromboembolisms including recent travel, inflammatory process, hormone therapy, sedentary lifestyle and clotting disorders.  It was determined that patient's DVT was  provoked as he was diagnosed with COVID 1-2 weeks before presentation.  We discussed duration of anticoagulation when there is a provoking factor identified.  Generally the recommendation is anticoagulation therapy for 3 to 6 months.  Patient has been on Xarelto for more than 6 months so it is reasonable to discontinue anticoagulation at this time.  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  If at any point, patient would like to discontinue anticoagulation then we recommend taking aspirin 81 mg once a day.  After discussing his options, patient would like to transition to maintenance therapy of Xarelto at 10 mg once a day.  Patient will proceed with serologic work-up today to check CBC, CMP and antiphospholipid syndrome panel.  #History of LLE DVT: --Diagnosed in June 2022 and felt to be provoked from COVID infection --Most recent doppler US from 04/14/2021 showed continued age-indeterminate thrombosis involving the left mid femoral vein, distal femoral vein, popliteal vein and peroneal veins. --Patient will proceed with maintenance anticoagulation therapy with Xarelto 10 mg once a day. --Labs today to check CBC, CMP, lupus anticoagulant panel, beta-2 glycoprotein antibodies and cardiolipin antibodies. --RTC in 6 months with labs  Orders Placed This Encounter  Procedures   CBC with Differential ([place] Only)    Standing Status:   Future    Number of Occurrences:   1    Standing Expiration Date:   11/17/2021   CMP ([place] only)    Standing Status:   Future    Number of Occurrences:   1    Standing Expiration Date:   11/17/2021   Beta-2-glycoprotein i abs, IgG/M/A    Standing Status:   Future    Number of Occurrences:   1    Standing Expiration Date:   11/17/2021   Cardiolipin antibodies, IgG, IgM,  IgA*     Standing Status:   Future    Number of Occurrences:   1    Standing Expiration Date:   11/17/2021   Lupus anticoagulant panel*    Standing Status:   Future    Number of Occurrences:   1    Standing Expiration Date:   11/17/2021    All questions were answered. The patient knows to call the clinic with any problems, questions or concerns.  I have spent a total of 60 minutes minutes of face-to-face and non-face-to-face time, preparing to see the patient, performing a medically appropriate examination, counseling and educating the patient, ordering medications/tests, documenting clinical information in the electronic health record, and care coordination.   Dede Query, PA-C Department of Hematology/Oncology [place] at [facility] Phone: [phone]  Patient seen with Dr. Lorenso Courier  I have read the above note and personally examined the patient. I agree with the assessment and plan as noted above.  Briefly Ryan [name] is a 56 year old male who presents for evaluation of an provoked lower extremity DVT.  The DVT was provoked in the setting of a COVID infection prior to the discovery of a blood clot.  This appears to be a somewhat weak provoking factor as he was not sedentary or particularly ill from the infection.  We discussed with him options moving forward including continuing full-strength anticoagulation, maintenance dose anticoagulation, baby aspirin, or no anticoagulation.  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  Given these findings our recommendation was for at least maintenance anticoagulation therapy.  The patient notes that he does not have any difficulty with the blood thinner and would be happy to continue on maintenance dose Xarelto.  Therefore we will continue with 10 mg p.o. daily and plan to see the patient back in 6 months time in order to  reevaluate.   Ledell Peoples, MD Department of Hematology/Oncology [place] at [facility] Phone: [phone] Pager: [phone] Email: [email or web address]

## 2021-05-18 LAB — DRVVT MIX: dRVVT Mix: 50.6 s — ABNORMAL HIGH (ref 0.0–40.4)

## 2021-05-18 LAB — CARDIOLIPIN ANTIBODIES, IGG, IGM, IGA
Anticardiolipin IgA: <9 APL U/mL (ref 0–11)
Anticardiolipin IgG: <9 GPL U/mL (ref 0–14)
Anticardiolipin IgM: 9 MPL U/mL (ref 0–12)

## 2021-05-18 LAB — LUPUS ANTICOAGULANT PANEL
DRVVT: 64.7 s — ABNORMAL HIGH (ref 0.0–47.0)
PTT Lupus Anticoagulant: 35.8 s (ref 0.0–43.5)

## 2021-05-18 LAB — DRVVT CONFIRM: dRVVT Confirm: 1.4 ratio — ABNORMAL HIGH (ref 0.8–1.2)

## 2021-05-18 MED ORDER — RIVAROXABAN 10 MG PO TABS: 10.0000 mg | ORAL_TABLET | Freq: Every day | ORAL | 6 refills | Status: DC

## 2021-05-19 ENCOUNTER — Telehealth: Payer: Self-pay | Admitting: Physician Assistant

## 2021-05-19 LAB — BETA-2-GLYCOPROTEIN I ABS, IGG/M/A
Beta-2 Glyco I IgG: <9 GPI IgG units (ref 0–20)
Beta-2-Glycoprotein I IgA: <9 GPI IgA units (ref 0–25)
Beta-2-Glycoprotein I IgM: <9 GPI IgM units (ref 0–32)

## 2021-05-19 NOTE — Telephone Encounter (Signed)
I called [name] to review the lab results from her consultation on 05/17/2021.  Findings were reviewed and require no intervention.  There is no evidence to suggest antiphospholipid syndrome.  Recommend to continue on Xarelto as discussed during the visit and confirmed that I sent prescription of the maintenance dose to his pharmacy on file.  We will see the patient back in 6 months for a follow-up visit. ? ?Patient expressed understanding and satisfaction with the plan provided ?

## 2021-07-14 ENCOUNTER — Ambulatory Visit: Payer: BC Managed Care – PPO | Admitting: Vascular Surgery

## 2021-07-14 ENCOUNTER — Encounter (HOSPITAL_COMMUNITY): Payer: BC Managed Care – PPO

## 2021-08-10 NOTE — Progress Notes (Unsigned)
Office Note    CC: History of left lower extremity femoral vein, popliteal vein deep venous thrombosis Requesting Provider:  Lorrene Reid, PA-C  HPI: Ryan [name] is a 56 y.o. ([birth year]) male presenting at the request of Dr. Candee Furbish for follow-up of left lower extremity DVT.  In June of last year, Ryan [name] appreciated left lower extremity swelling and was diagnosed with nonocclusive femoral vein, popliteal, peroneal vein deep venous thrombosis.  He was started on anticoagulation and has improved significantly since that time.  The etiology of his thrombotic event was believed to be COVID-related.  Since that time, Ryan [name] has been doing well.  He is continued on Xarelto.  Ryan [name] is very active and 56 years old, golfing regularly.  He can walk 18 holes without any pain in his lower extremities.  He denies claudication, ischemic rest pain, tissue loss.  He has not appreciated left leg heaviness, or other symptoms associated with post thrombotic syndrome.  The pt is  on a statin for cholesterol management.  The pt is - on a daily aspirin.   Other AC:  xarelto The pt is not on medication for hypertension.   The pt is not diabetic.  Tobacco hx:  -  Past Medical History:  Diagnosis Date   Allergy    mild   Asthma    athletic induced asthma age 36    Bronchitis    with viral infection nov/Dec 2019   COVID-19    Deviated septum    DVT (deep venous thrombosis) (HCC)    Hyperlipidemia    Nephritis 1972    Past Surgical History:  Procedure Laterality Date   APPENDECTOMY  1984    Social History   Socioeconomic History   Marital status: Married    Spouse name: Not on file   Number of children: 4   Years of education: Not on file   Highest education level: Master's degree (e.g., MA, MS, MEng, MEd, MSW, MBA)  Occupational History   Occupation: Engineer, agricultural: [employer]  Tobacco Use   Smoking status: Never   Smokeless tobacco: Never   Vaping Use   Vaping Use: Never used  Substance and Sexual Activity   Alcohol use: Yes    Alcohol/week: 1.0 standard drink    Types: 1 Standard drinks or equivalent per week    Comment: social   Drug use: No   Sexual activity: Yes    Birth control/protection: None  Other Topics Concern   Not on file  Social History Narrative   Not on file   Social Determinants of Health   Financial Resource Strain: Not on file  Food Insecurity: Not on file  Transportation Needs: Not on file  Physical Activity: Not on file  Stress: Not on file  Social Connections: Not on file  Intimate Partner Violence: Not on file    Family History  Problem Relation Age of Onset   Alcohol abuse Mother    Heart attack Father    Diabetes Maternal Grandfather    Pancreatic cancer Maternal Aunt    Colon cancer Neg Hx    Colon polyps Neg Hx    Esophageal cancer Neg Hx    Rectal cancer Neg Hx    Stomach cancer Neg Hx    Allergic rhinitis Neg Hx    Angioedema Neg Hx    Asthma Neg Hx    Atopy Neg Hx    Eczema Neg Hx    Immunodeficiency Neg  Hx    Urticaria Neg Hx     Current Outpatient Medications  Medication Sig Dispense Refill   ciclesonide (ALVESCO) 160 MCG/ACT inhaler Inhale 2 puffs into the lungs 2 (two) times daily. (Patient not taking: Reported on 02/17/2021) 1 each 5   levothyroxine (SYNTHROID) 25 MCG tablet TAKE 1 TABLET BY MOUTH EVERY DAY BEFORE BREAKFAST 90 tablet 1   rivaroxaban (XARELTO) 10 MG TABS tablet Take 1 tablet (10 mg total) by mouth daily. 30 tablet 6   rosuvastatin (CRESTOR) 5 MG tablet Take 1 tablet (5 mg total) by mouth daily. 90 tablet 3   No current facility-administered medications for this visit.    No Known Allergies   REVIEW OF SYSTEMS:   '[X]'$  denotes positive finding, '[ ]'$  denotes negative finding Cardiac  Comments:  Chest pain or chest pressure:    Shortness of breath upon exertion:    Short of breath when lying flat:    Irregular heart rhythm:        Vascular     Pain in calf, thigh, or hip brought on by ambulation:    Pain in feet at night that wakes you up from your sleep:     Blood clot in your veins:    Leg swelling:         Pulmonary    Oxygen at home:    Productive cough:     Wheezing:         Neurologic    Sudden weakness in arms or legs:     Sudden numbness in arms or legs:     Sudden onset of difficulty speaking or slurred speech:    Temporary loss of vision in one eye:     Problems with dizziness:         Gastrointestinal    Blood in stool:     Vomited blood:         Genitourinary    Burning when urinating:     Blood in urine:        Psychiatric    Major depression:         Hematologic    Bleeding problems:    Problems with blood clotting too easily:        Skin    Rashes or ulcers:        Constitutional    Fever or chills:      PHYSICAL EXAMINATION:  There were no vitals filed for this visit.  General:  WDWN in NAD; vital signs documented above Gait: Not observed HENT: WNL, normocephalic Pulmonary: normal non-labored breathing , without wheezing Cardiac: regular HR,  Abdomen: soft, NT, no masses Skin: without rashes Vascular Exam/Pulses:  Right Left  Radial 2+ (normal) 2+ (normal)  Ulnar 2+ (normal) 2+ (normal)  Femoral    Popliteal    DP 2+ (normal) 2+ (normal)  PT 2+ (normal) 2+ (normal)   Extremities: without ischemic changes, without Gangrene , without cellulitis; without open wounds;  Musculoskeletal: no muscle wasting or atrophy  Neurologic: A&O X 3;  No focal weakness or paresthesias are detected Psychiatric:  The pt has Normal affect.   Non-Invasive Vascular Imaging:    08/20/2020: Lt- Acute deep vein thrombosis noted in the  distal femoral vein, popliteal vein, and peroneal veins.   12/15/20: 08/20/2020: Lt- Acute deep vein thrombosis noted in the  distal femoral vein, popliteal vein, and peroneal veins.   Summary:  Left:  - No evidence of superficial venous thrombosis in the  left lower  extremity.  - Continued age-indeterminate thrombosis involving the mid femoral vein,  distal femoral vein, popliteal vein and peroneal veins.  Mixed arterial flow appreciated in the pop and distal femoral vein however this is no appreciated in the common femoral vein.  - The great saphenous vein is competent.  - The small saphenous vein is competent.    ASSESSMENT/PLAN: Ryan [name] is a 56 y.o. male presenting with a 43-monthhistory of left lower extremity DVT.  His new duplex ultrasound was reviewed demonstrating continued, nonocclusive thrombus in the mid femoral vein, popliteal vein, peroneal veins.  Waveforms in the area of insonation also demonstrated some pulsatility.  AV fistula from deep venous thrombosis is rare.  Furthermore, the pulsatility is not appreciated in the common femoral vein, which is what I would expect.  Regardless, I will see Ryan [name] 3 months for repeat ultrasound.  If pulsatile waveforms are still present, we will pursue CT angiogram.  In the interim, I am going to have him see hematology oncology.  The presumptive etiology for his thrombosis was COVID.  I would appreciate their expertise on whether anticoagulation is needed at this point as it appears to be a provoked DVT.  I asked that he continue his current medication regimens until that time.  He was asked to call my office should any questions or concerns arise.  Briefly Ryan [name] a 56year old male who presents for evaluation of an provoked lower extremity DVT.  The DVT was provoked in the setting of a COVID infection prior to the discovery of a blood clot.  This appears to be a somewhat weak provoking factor as he was not sedentary or particularly ill from the infection.  We discussed with him options moving forward including continuing full-strength anticoagulation, maintenance dose anticoagulation, baby aspirin, or no anticoagulation.  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  Given these findings our recommendation was for at least maintenance anticoagulation therapy.  The patient notes that he does not have any difficulty with the blood thinner and would be happy to continue on maintenance dose Xarelto.  Therefore we will continue with 10 mg p.o. daily and plan to see the patient back in 6 months time in order to reevaluate.   JBroadus John MD [facility] [phone]

## 2021-08-11 ENCOUNTER — Ambulatory Visit: Payer: BC Managed Care – PPO | Admitting: Vascular Surgery

## 2021-08-11 ENCOUNTER — Inpatient Hospital Stay (HOSPITAL_COMMUNITY): Admission: RE | Admit: 2021-08-11 | Payer: BC Managed Care – PPO | Source: Ambulatory Visit

## 2021-09-01 NOTE — Patient Instructions (Signed)

## 2021-09-04 ENCOUNTER — Ambulatory Visit (INDEPENDENT_AMBULATORY_CARE_PROVIDER_SITE_OTHER): Payer: BC Managed Care – PPO | Admitting: Physician Assistant

## 2021-09-04 ENCOUNTER — Encounter: Payer: Self-pay | Admitting: Physician Assistant

## 2021-09-04 VITALS — BP 121/86 | HR 85 | Ht 71.0 in | Wt 238.0 lb

## 2021-09-04 DIAGNOSIS — E038 Other specified hypothyroidism: Secondary | ICD-10-CM

## 2021-09-04 DIAGNOSIS — E785 Hyperlipidemia, unspecified: Secondary | ICD-10-CM | POA: Diagnosis not present

## 2021-09-04 DIAGNOSIS — Z125 Encounter for screening for malignant neoplasm of prostate: Secondary | ICD-10-CM

## 2021-09-04 DIAGNOSIS — M62830 Muscle spasm of back: Secondary | ICD-10-CM

## 2021-09-04 DIAGNOSIS — E559 Vitamin D deficiency, unspecified: Secondary | ICD-10-CM

## 2021-09-04 DIAGNOSIS — Z0000 Encounter for general adult medical examination without abnormal findings: Secondary | ICD-10-CM | POA: Diagnosis not present

## 2021-09-04 MED ORDER — METHOCARBAMOL 500 MG PO TABS: 500.0000 mg | ORAL_TABLET | Freq: Three times a day (TID) | ORAL | 0 refills | Status: DC | PRN

## 2021-09-04 NOTE — Progress Notes (Signed)
Complete physical exam   Patient: Ryan [name]   DOB: Jul 21, 1965   56 y.o. Male  MRN: [id number] Visit Date: 09/04/2021   Chief Complaint  Patient presents with   Annual Exam    Patient presents today for physical   Subjective    Ryan [name] is a 56 y.o. male who presents today for a complete physical exam.  He reports consuming a general diet.  Stretches and exercises especially for back, has been having intermittent spasms.   He generally feels fairly well. He does not have additional problems to discuss today.     Past Medical History:  Diagnosis Date   Allergy    mild   Asthma    athletic induced asthma age 35    Bronchitis    with viral infection nov/Dec 2019   COVID-19    Deviated septum    DVT (deep venous thrombosis) (HCC)    Hyperlipidemia    Nephritis 1972   Past Surgical History:  Procedure Laterality Date   APPENDECTOMY  1984   Social History   Socioeconomic History   Marital status: Married    Spouse name: Not on file   Number of children: 4   Years of education: Not on file   Highest education level: Master's degree (e.g., MA, MS, MEng, MEd, MSW, MBA)  Occupational History   Occupation: Engineer, agricultural: [employer]  Tobacco Use   Smoking status: Never   Smokeless tobacco: Never  Vaping Use   Vaping Use: Never used  Substance and Sexual Activity   Alcohol use: Yes    Alcohol/week: 1.0 standard drink of alcohol    Types: 1 Standard drinks or equivalent per week    Comment: social   Drug use: No   Sexual activity: Yes    Birth control/protection: None  Other Topics Concern   Not on file  Social History Narrative   Not on file   Social Determinants of Health   Financial Resource Strain: Not on file  Food Insecurity: Not on file  Transportation Needs: Not on file  Physical Activity: Insufficiently Active (02/05/2017)   Exercise Vital Sign    Days of Exercise per Week: 7 days    Minutes of  Exercise per Session: 20 min  Stress: Not on file  Social Connections: Not on file  Intimate Partner Violence: Not on file     Medications: Outpatient Medications Prior to Visit  Medication Sig   ciclesonide (ALVESCO) 160 MCG/ACT inhaler Inhale 2 puffs into the lungs 2 (two) times daily. (Patient not taking: Reported on 02/17/2021)   levothyroxine (SYNTHROID) 25 MCG tablet TAKE 1 TABLET BY MOUTH EVERY DAY BEFORE BREAKFAST   rivaroxaban (XARELTO) 10 MG TABS tablet Take 1 tablet (10 mg total) by mouth daily.   rosuvastatin (CRESTOR) 5 MG tablet Take 1 tablet (5 mg total) by mouth daily.   No facility-administered medications prior to visit.    Review of Systems Review of Systems:  A fourteen system review of systems was performed and found to be positive as per HPI.     Objective     BP 121/86   Pulse 85   Ht _0  (1.803 m)   Wt 238 lb (108 kg)   SpO2 97%   BMI 33.19 kg/m    Physical Exam   General Appearance:    Alert, cooperative, in no acute distress, appears stated age  Head:    Normocephalic, without obvious  abnormality, atraumatic  Eyes:    PERRL, conjunctiva/corneas clear, EOM's intact, fundi    benign, both eyes       Ears:    Normal TM's and external ear canals, both ears  Nose:   Nares normal, septum midline, mucosa normal, no drainage   or sinus tenderness  Throat:   Lips, mucosa, and tongue normal; teeth and gums normal  Neck:   Supple, symmetrical, trachea midline, no adenopathy;       thyroid:  No enlargement/tenderness/nodules; no JVD  Back:     Symmetric, no curvature, ROM normal, no CVA tenderness  Lungs:     Clear to auscultation bilaterally, respirations unlabored  Chest wall:    No tenderness or deformity  Heart:    Normal heart rate. Normal rhythm. No murmurs, rubs, or gallops.  S1 and S2 normal  Abdomen:     Soft, non-tender, bowel sounds active all four quadrants,    no masses, no organomegaly  Genitalia:    deferred  Rectal:    deferred   Extremities:   All extremities are intact. No cyanosis. LLE swelling. No calve tenderness.  Pulses:   2+ and symmetric all extremities  Skin:   Skin color, texture, turgor normal, no rashes or lesions  Lymph nodes:   Cervical and supraclavicular nodes normal  Neurologic:   CNII-XII grossly intact.      Last depression screening scores    09/04/2021    8:28 AM 12/14/2020    2:58 PM 09/01/2020    3:11 PM  PHQ 2/9 Scores  PHQ - 2 Score 0 0 0  PHQ- 9 Score  0 0  Exception Documentation Medical reason     Last fall risk screening    09/04/2021    8:28 AM  [place] in the past year? 0  Number falls in past yr: 0  Injury with Fall? 0  Risk for fall due to : No Fall Risks  Follow up Education provided;Falls evaluation completed     No results found for any visits on 09/04/21.  Assessment & Plan    Routine Health Maintenance and Physical Exam  Exercise Activities and Dietary recommendations -Discussed heart healthy diet low in fat and carbohydrates.   Immunization History  Administered Date(s) Administered   Hepatitis B, ped/adol 05/14/2012, 06/18/2012   Influenza,inj,Quad PF,6+ Mos 03/02/2019   Influenza-Unspecified 02/25/2020   Moderna Sars-Covid-2 Vaccination 05/16/2019, 06/13/2019, 02/25/2020   Tdap 03/20/2009, 04/14/2020   Zoster Recombinat (Shingrix) 04/14/2020    Health Maintenance  Topic Date Due   COLONOSCOPY (Pts 45-41yr Insurance coverage will need to be confirmed)  Never done   COVID-19 Vaccine (4 - Moderna series) 04/21/2020   Zoster Vaccines- Shingrix (2 of 2) 06/09/2020   INFLUENZA VACCINE  10/17/2021   TETANUS/TDAP  04/14/2030   Hepatitis C Screening  Completed   HIV Screening  Completed   HPV VACCINES  Aged Out    Discussed health benefits of physical activity, and encouraged him to engage in regular exercise appropriate for his age and condition.  Problem List Items Addressed This Visit       Endocrine   Subclinical hypothyroidism    Relevant Orders   TSH   T4, free     Other   Vitamin D insufficiency   Relevant Orders   Vitamin D (25 hydroxy)   Hyperlipidemia   Relevant Orders   CBC w/Diff   Comp Met (CMET)   Lipid Profile   HgB A1c  Other Visit Diagnoses     Healthcare maintenance    -  Primary   Relevant Orders   CBC w/Diff   Comp Met (CMET)   Lipid Profile   HgB A1c   Back muscle spasm       Relevant Medications   methocarbamol (ROBAXIN) 500 MG tablet   Screening for prostate cancer       Relevant Orders   PSA      Will obtain routine fasting labs. Patient agreeable to PSA. Will obtain second shingrix at f/up visit. Patient was referred to gastroenterology for screening colonoscopy 03/2020 and recall is in the system, pt preferred to wait due to Covid-19.  Discussed with patient recommend to continue with anticoagulant therapy for chronic DVT.    Return in about 3 months (around 12/05/2021) for thyroid, HLD, DVT, due for second shingles.       Lorrene Reid, PA-C  [facility] [phone] (phone) [phone] (fax)  [place]

## 2021-09-05 ENCOUNTER — Other Ambulatory Visit: Payer: Self-pay | Admitting: Physician Assistant

## 2021-09-05 DIAGNOSIS — E038 Other specified hypothyroidism: Secondary | ICD-10-CM

## 2021-09-05 DIAGNOSIS — R7989 Other specified abnormal findings of blood chemistry: Secondary | ICD-10-CM

## 2021-09-05 LAB — COMPREHENSIVE METABOLIC PANEL
ALT: 26 IU/L (ref 0–44)
AST: 19 IU/L (ref 0–40)
Albumin/Globulin Ratio: 1.8 (ref 1.2–2.2)
Albumin: 4.5 g/dL (ref 3.8–4.9)
Alkaline Phosphatase: 56 IU/L (ref 44–121)
BUN/Creatinine Ratio: 15 (ref 9–20)
BUN: 21 mg/dL (ref 6–24)
Bilirubin Total: 0.5 mg/dL (ref 0.0–1.2)
CO2: 20 mmol/L (ref 20–29)
Calcium: 9.3 mg/dL (ref 8.7–10.2)
Chloride: 103 mmol/L (ref 96–106)
Creatinine, Ser: 1.41 mg/dL — ABNORMAL HIGH (ref 0.76–1.27)
Globulin, Total: 2.5 g/dL (ref 1.5–4.5)
Glucose: 103 mg/dL — ABNORMAL HIGH (ref 70–99)
Potassium: 4.9 mmol/L (ref 3.5–5.2)
Sodium: 139 mmol/L (ref 134–144)
Total Protein: 7 g/dL (ref 6.0–8.5)
eGFR: 58 mL/min/{1.73_m2} — ABNORMAL LOW (ref >59)

## 2021-09-05 LAB — CBC WITH DIFFERENTIAL/PLATELET
Basophils Absolute: 0.1 10*3/uL (ref 0.0–0.2)
Basos: 1 %
EOS (ABSOLUTE): 0.1 10*3/uL (ref 0.0–0.4)
Eos: 2 %
Hematocrit: 45.3 % (ref 37.5–51.0)
Hemoglobin: 15.3 g/dL (ref 13.0–17.7)
Immature Grans (Abs): 0 10*3/uL (ref 0.0–0.1)
Immature Granulocytes: 0 %
Lymphocytes Absolute: 2.1 10*3/uL (ref 0.7–3.1)
Lymphs: 34 %
MCH: 30.5 pg (ref 26.6–33.0)
MCHC: 33.8 g/dL (ref 31.5–35.7)
MCV: 90 fL (ref 79–97)
Monocytes Absolute: 0.5 10*3/uL (ref 0.1–0.9)
Monocytes: 8 %
Neutrophils Absolute: 3.3 10*3/uL (ref 1.4–7.0)
Neutrophils: 55 %
Platelets: 259 10*3/uL (ref 150–450)
RBC: 5.02 x10E6/uL (ref 4.14–5.80)
RDW: 12.4 % (ref 11.6–15.4)
WBC: 6.1 10*3/uL (ref 3.4–10.8)

## 2021-09-05 LAB — HEMOGLOBIN A1C
Est. average glucose Bld gHb Est-mCnc: 117 mg/dL
Hgb A1c MFr Bld: 5.7 % — ABNORMAL HIGH (ref 4.8–5.6)

## 2021-09-05 LAB — LIPID PANEL
Chol/HDL Ratio: 3.4 ratio (ref 0.0–5.0)
Cholesterol, Total: 161 mg/dL (ref 100–199)
HDL: 48 mg/dL (ref >39)
LDL Chol Calc (NIH): 94 mg/dL (ref 0–99)
Triglycerides: 103 mg/dL (ref 0–149)
VLDL Cholesterol Cal: 19 mg/dL (ref 5–40)

## 2021-09-05 LAB — VITAMIN D 25 HYDROXY (VIT D DEFICIENCY, FRACTURES): Vit D, 25-Hydroxy: 41.6 ng/mL (ref 30.0–100.0)

## 2021-09-05 LAB — T4, FREE: Free T4: 1 ng/dL (ref 0.82–1.77)

## 2021-09-05 LAB — PSA: Prostate Specific Ag, Serum: 0.6 ng/mL (ref 0.0–4.0)

## 2021-09-05 LAB — TSH: TSH: 3.83 u[IU]/mL (ref 0.450–4.500)

## 2021-09-22 ENCOUNTER — Ambulatory Visit: Payer: BC Managed Care – PPO | Admitting: Vascular Surgery

## 2021-09-22 ENCOUNTER — Encounter (HOSPITAL_COMMUNITY): Payer: BC Managed Care – PPO

## 2021-10-11 NOTE — Progress Notes (Signed)
Office Note    HPI: Ryan [name] is a 55 y.o. (07-Aug-1965) male presenting in follow-up with known left lower extremity chronic DVT.  In June of last year, Ryan [name] appreciated left lower extremity swelling and was diagnosed with nonocclusive femoral vein, popliteal, peroneal vein deep venous thrombosis.  He was started on anticoagulation and has improved significantly since that time.  The etiology of his thrombotic event was believed to be COVID-related.  He was seen by hematology who stated he could stop Xarelto as the DVT was provoked.  He elected to stay on Xarelto to lower the risk of recurrent DVT.  On exam today, Ryan [name] was doing well.  He continues on Xarelto.  He has noted some myalgias and believe it is from his statin.  He continues to play golf on a regular basis walking 18 holes with no claudication.  He and his wife are excited about [employer] Ryan [name] concert coming up in [place] this fall.  He has not appreciated left leg heaviness, or other symptoms associated with post thrombotic syndrome.  The pt is  on a statin for cholesterol management.  The pt is - on a daily aspirin.   Other AC:  xarelto The pt is not on medication for hypertension.   The pt is not diabetic.  Tobacco hx:  -  Past Medical History:  Diagnosis Date   Allergy    mild   Asthma    athletic induced asthma age 38    Bronchitis    with viral infection nov/Dec 2019   COVID-19    Deviated septum    DVT (deep venous thrombosis) (HCC)    Hyperlipidemia    Nephritis 1972    Past Surgical History:  Procedure Laterality Date   APPENDECTOMY  1984    Social History   Socioeconomic History   Marital status: Married    Spouse name: Not on file   Number of children: 4   Years of education: Not on file   Highest education level: Master's degree (e.g., MA, MS, MEng, MEd, MSW, MBA)  Occupational History   Occupation: Engineer, agricultural: [employer]   Tobacco Use   Smoking status: Never   Smokeless tobacco: Never  Vaping Use   Vaping Use: Never used  Substance and Sexual Activity   Alcohol use: Yes    Alcohol/week: 1.0 standard drink of alcohol    Types: 1 Standard drinks or equivalent per week    Comment: social   Drug use: No   Sexual activity: Yes    Birth control/protection: None  Other Topics Concern   Not on file  Social History Narrative   Not on file   Social Determinants of Health   Financial Resource Strain: Not on file  Food Insecurity: Not on file  Transportation Needs: Not on file  Physical Activity: Insufficiently Active (02/05/2017)   Exercise Vital Sign    Days of Exercise per Week: 7 days    Minutes of Exercise per Session: 20 min  Stress: Not on file  Social Connections: Not on file  Intimate Partner Violence: Not on file    Family History  Problem Relation Age of Onset   Alcohol abuse Mother    Heart attack Father    Diabetes Maternal Grandfather    Pancreatic cancer Maternal Aunt    Colon cancer Neg Hx    Colon polyps Neg Hx    Esophageal cancer Neg Hx  Rectal cancer Neg Hx    Stomach cancer Neg Hx    Allergic rhinitis Neg Hx    Angioedema Neg Hx    Asthma Neg Hx    Atopy Neg Hx    Eczema Neg Hx    Immunodeficiency Neg Hx    Urticaria Neg Hx     Current Outpatient Medications  Medication Sig Dispense Refill   ciclesonide (ALVESCO) 160 MCG/ACT inhaler Inhale 2 puffs into the lungs 2 (two) times daily. (Patient not taking: Reported on 02/17/2021) 1 each 5   levothyroxine (SYNTHROID) 25 MCG tablet TAKE 1 TABLET BY MOUTH EVERY DAY BEFORE BREAKFAST 90 tablet 1   methocarbamol (ROBAXIN) 500 MG tablet Take 1 tablet (500 mg total) by mouth every 8 (eight) hours as needed for muscle spasms. 30 tablet 0   rivaroxaban (XARELTO) 10 MG TABS tablet Take 1 tablet (10 mg total) by mouth daily. 30 tablet 6   rosuvastatin (CRESTOR) 5 MG tablet Take 1 tablet (5 mg total) by mouth daily. 90 tablet 3    No current facility-administered medications for this visit.    No Known Allergies   REVIEW OF SYSTEMS:   '[X]'$  denotes positive finding, '[ ]'$  denotes negative finding Cardiac  Comments:  Chest pain or chest pressure:    Shortness of breath upon exertion:    Short of breath when lying flat:    Irregular heart rhythm:        Vascular    Pain in calf, thigh, or hip brought on by ambulation:    Pain in feet at night that wakes you up from your sleep:     Blood clot in your veins:    Leg swelling:         Pulmonary    Oxygen at home:    Productive cough:     Wheezing:         Neurologic    Sudden weakness in arms or legs:     Sudden numbness in arms or legs:     Sudden onset of difficulty speaking or slurred speech:    Temporary loss of vision in one eye:     Problems with dizziness:         Gastrointestinal    Blood in stool:     Vomited blood:         Genitourinary    Burning when urinating:     Blood in urine:        Psychiatric    Major depression:         Hematologic    Bleeding problems:    Problems with blood clotting too easily:        Skin    Rashes or ulcers:        Constitutional    Fever or chills:      PHYSICAL EXAMINATION:  There were no vitals filed for this visit.  General:  WDWN in NAD; vital signs documented above Gait: Not observed HENT: WNL, normocephalic Pulmonary: normal non-labored breathing , without wheezing Cardiac: regular HR,  Abdomen: soft, NT, no masses Skin: without rashes Vascular Exam/Pulses:  Right Left  Radial 2+ (normal) 2+ (normal)  Ulnar 2+ (normal) 2+ (normal)  Femoral    Popliteal    DP 2+ (normal) 2+ (normal)  PT 2+ (normal) 2+ (normal)   Extremities: without ischemic changes, without Gangrene , without cellulitis; without open wounds;  Musculoskeletal: no muscle wasting or atrophy  Neurologic: A&O X 3;  No focal weakness or paresthesias  are detected Psychiatric:  The pt has Normal  affect.   Non-Invasive Vascular Imaging:    Summary:   LEFT:  - Findings consistent with chronic deep vein thrombosis involving the left  femoral vein, and left popliteal vein.  - There is no evidence of superficial venous thrombosis.     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     *See table(s) above for measurements and observations.    ASSESSMENT/PLAN: DARDAN SHELTON is a 56 y.o. male presenting with chronic left lower extremity DVT.  His new duplex ultrasound was reviewed demonstrating continued, nonocclusive thrombus in the mid femoral vein, popliteal vein, peroneal veins.  Waveforms in the area of insonation continue to demonstrate some pulsatility.    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  Being that he is asymptomatic from the arterial and venous standpoint the left lower extremity this is inconsequential.  I discussed that while we could pursue further imaging-CT angio of the left leg, it would not change our current management strategy.   My plan is to see Ryan [name] in 1 year with ABI to assess his symptoms.  Should he progress to claudication or have any arterial insufficiency, we would discuss CT angiogram to further define the likely arteriovenous fistula.  Regarding his myalgias, I recommended that Ryan [name] speak to his primary care provider about stopping his statin medication to see if it is a side effect of the medication or something else.  Should it be a side effect, recommend pravastatin as it has a lower side effect profile.  I also told Ryan [name] that data supports stopping Xarelto after 3 months in the setting of provoked DVT.  I understand his concerns and support him staying on the medication.  I asked him to call my office should any questions or concerns arise.  Ryan John, MD [facility] [phone]

## 2021-10-13 ENCOUNTER — Ambulatory Visit (HOSPITAL_COMMUNITY)
Admission: RE | Admit: 2021-10-13 | Discharge: 2021-10-13 | Disposition: A | Discharge status: Home / Self Care | Payer: BC Managed Care – PPO | Source: Ambulatory Visit | Attending: Vascular Surgery | Admitting: Vascular Surgery

## 2021-10-13 ENCOUNTER — Ambulatory Visit: Payer: BC Managed Care – PPO | Admitting: Vascular Surgery

## 2021-10-13 ENCOUNTER — Encounter: Payer: Self-pay | Admitting: Vascular Surgery

## 2021-10-13 VITALS — BP 103/72 | HR 96 | Temp 98.7°F | Resp 20 | Ht 71.0 in | Wt 230.0 lb

## 2021-10-13 DIAGNOSIS — I82512 Chronic embolism and thrombosis of left femoral vein: Secondary | ICD-10-CM

## 2021-10-13 DIAGNOSIS — I770 Arteriovenous fistula, acquired: Secondary | ICD-10-CM | POA: Diagnosis not present

## 2021-10-13 DIAGNOSIS — I872 Venous insufficiency (chronic) (peripheral): Secondary | ICD-10-CM

## 2021-10-13 DIAGNOSIS — I82552 Chronic embolism and thrombosis of left peroneal vein: Secondary | ICD-10-CM | POA: Diagnosis present

## 2021-10-27 ENCOUNTER — Other Ambulatory Visit: Payer: BC Managed Care – PPO

## 2021-10-27 ENCOUNTER — Other Ambulatory Visit: Payer: Self-pay

## 2021-10-27 DIAGNOSIS — E785 Hyperlipidemia, unspecified: Secondary | ICD-10-CM

## 2021-10-27 DIAGNOSIS — R7989 Other specified abnormal findings of blood chemistry: Secondary | ICD-10-CM

## 2021-10-27 DIAGNOSIS — Z0000 Encounter for general adult medical examination without abnormal findings: Secondary | ICD-10-CM

## 2021-10-27 DIAGNOSIS — E038 Other specified hypothyroidism: Secondary | ICD-10-CM

## 2021-10-28 LAB — CBC WITH DIFFERENTIAL/PLATELET
Basophils Absolute: 0.1 10*3/uL (ref 0.0–0.2)
Basos: 1 %
EOS (ABSOLUTE): 0.1 10*3/uL (ref 0.0–0.4)
Eos: 1 %
Hematocrit: 43.5 % (ref 37.5–51.0)
Hemoglobin: 14.7 g/dL (ref 13.0–17.7)
Immature Grans (Abs): 0 10*3/uL (ref 0.0–0.1)
Immature Granulocytes: 0 %
Lymphocytes Absolute: 2 10*3/uL (ref 0.7–3.1)
Lymphs: 35 %
MCH: 31 pg (ref 26.6–33.0)
MCHC: 33.8 g/dL (ref 31.5–35.7)
MCV: 92 fL (ref 79–97)
Monocytes Absolute: 0.5 10*3/uL (ref 0.1–0.9)
Monocytes: 8 %
Neutrophils Absolute: 3.1 10*3/uL (ref 1.4–7.0)
Neutrophils: 55 %
Platelets: 234 10*3/uL (ref 150–450)
RBC: 4.74 x10E6/uL (ref 4.14–5.80)
RDW: 12.4 % (ref 11.6–15.4)
WBC: 5.7 10*3/uL (ref 3.4–10.8)

## 2021-10-28 LAB — COMPREHENSIVE METABOLIC PANEL
ALT: 27 IU/L (ref 0–44)
AST: 18 IU/L (ref 0–40)
Albumin/Globulin Ratio: 1.8 (ref 1.2–2.2)
Albumin: 4.2 g/dL (ref 3.8–4.9)
Alkaline Phosphatase: 56 IU/L (ref 44–121)
BUN/Creatinine Ratio: 17 (ref 9–20)
BUN: 20 mg/dL (ref 6–24)
Bilirubin Total: 0.6 mg/dL (ref 0.0–1.2)
CO2: 20 mmol/L (ref 20–29)
Calcium: 9.2 mg/dL (ref 8.7–10.2)
Chloride: 102 mmol/L (ref 96–106)
Creatinine, Ser: 1.19 mg/dL (ref 0.76–1.27)
Globulin, Total: 2.3 g/dL (ref 1.5–4.5)
Glucose: 89 mg/dL (ref 70–99)
Potassium: 4.5 mmol/L (ref 3.5–5.2)
Sodium: 137 mmol/L (ref 134–144)
Total Protein: 6.5 g/dL (ref 6.0–8.5)
eGFR: 71 mL/min/{1.73_m2} (ref >59)

## 2021-10-28 LAB — LIPID PANEL
Chol/HDL Ratio: 3.4 ratio (ref 0.0–5.0)
Cholesterol, Total: 149 mg/dL (ref 100–199)
HDL: 44 mg/dL (ref >39)
LDL Chol Calc (NIH): 88 mg/dL (ref 0–99)
Triglycerides: 91 mg/dL (ref 0–149)
VLDL Cholesterol Cal: 17 mg/dL (ref 5–40)

## 2021-10-28 LAB — HEMOGLOBIN A1C
Est. average glucose Bld gHb Est-mCnc: 114 mg/dL
Hgb A1c MFr Bld: 5.6 % (ref 4.8–5.6)

## 2021-10-28 LAB — TSH: TSH: 3.65 u[IU]/mL (ref 0.450–4.500)

## 2021-11-21 ENCOUNTER — Other Ambulatory Visit: Payer: Self-pay

## 2021-11-21 ENCOUNTER — Other Ambulatory Visit: Payer: Self-pay | Admitting: Physician Assistant

## 2021-11-21 DIAGNOSIS — I82493 Acute embolism and thrombosis of other specified deep vein of lower extremity, bilateral: Secondary | ICD-10-CM

## 2021-11-22 ENCOUNTER — Other Ambulatory Visit: Payer: Self-pay

## 2021-11-22 ENCOUNTER — Inpatient Hospital Stay: Payer: BC Managed Care – PPO | Admitting: Physician Assistant

## 2021-11-22 ENCOUNTER — Inpatient Hospital Stay: Payer: BC Managed Care – PPO | Attending: Physician Assistant

## 2021-11-22 VITALS — BP 105/74 | HR 70 | Temp 97.7°F | Resp 18 | Wt 236.1 lb

## 2021-11-22 DIAGNOSIS — Z86718 Personal history of other venous thrombosis and embolism: Secondary | ICD-10-CM

## 2021-11-22 DIAGNOSIS — E785 Hyperlipidemia, unspecified: Secondary | ICD-10-CM | POA: Insufficient documentation

## 2021-11-22 DIAGNOSIS — Z79899 Other long term (current) drug therapy: Secondary | ICD-10-CM | POA: Insufficient documentation

## 2021-11-22 DIAGNOSIS — I82493 Acute embolism and thrombosis of other specified deep vein of lower extremity, bilateral: Secondary | ICD-10-CM

## 2021-11-22 DIAGNOSIS — Z7901 Long term (current) use of anticoagulants: Secondary | ICD-10-CM | POA: Insufficient documentation

## 2021-11-22 LAB — CBC WITH DIFFERENTIAL (CANCER CENTER ONLY)
Abs Immature Granulocytes: 0.02 10*3/uL (ref 0.00–0.07)
Basophils Absolute: 0.1 10*3/uL (ref 0.0–0.1)
Basophils Relative: 1 %
Eosinophils Absolute: 0.1 10*3/uL (ref 0.0–0.5)
Eosinophils Relative: 1 %
HCT: 42.1 % (ref 39.0–52.0)
Hemoglobin: 14.6 g/dL (ref 13.0–17.0)
Immature Granulocytes: 0 %
Lymphocytes Relative: 32 %
Lymphs Abs: 2.6 10*3/uL (ref 0.7–4.0)
MCH: 31.1 pg (ref 26.0–34.0)
MCHC: 34.7 g/dL (ref 30.0–36.0)
MCV: 89.8 fL (ref 80.0–100.0)
Monocytes Absolute: 0.6 10*3/uL (ref 0.1–1.0)
Monocytes Relative: 8 %
Neutro Abs: 4.7 10*3/uL (ref 1.7–7.7)
Neutrophils Relative %: 58 %
Platelet Count: 237 10*3/uL (ref 150–400)
RBC: 4.69 MIL/uL (ref 4.22–5.81)
RDW: 13 % (ref 11.5–15.5)
WBC Count: 8.1 10*3/uL (ref 4.0–10.5)
nRBC: 0 % (ref 0.0–0.2)

## 2021-11-22 LAB — CMP (CANCER CENTER ONLY)
ALT: 23 U/L (ref 0–44)
AST: 19 U/L (ref 15–41)
Albumin: 4.1 g/dL (ref 3.5–5.0)
Alkaline Phosphatase: 40 U/L (ref 38–126)
Anion gap: 8 (ref 5–15)
BUN: 21 mg/dL — ABNORMAL HIGH (ref 6–20)
CO2: 22 mmol/L (ref 22–32)
Calcium: 9.2 mg/dL (ref 8.9–10.3)
Chloride: 108 mmol/L (ref 98–111)
Creatinine: 1.27 mg/dL — ABNORMAL HIGH (ref 0.61–1.24)
GFR, Estimated: >60 mL/min (ref >60)
Glucose, Bld: 93 mg/dL (ref 70–99)
Potassium: 4.2 mmol/L (ref 3.5–5.1)
Sodium: 138 mmol/L (ref 135–145)
Total Bilirubin: 0.7 mg/dL (ref 0.3–1.2)
Total Protein: 7.1 g/dL (ref 6.5–8.1)

## 2021-11-22 NOTE — Progress Notes (Signed)
[place] [phone]   [phone]  PROGRESS NOTE  Patient Care Team: Lorrene Reid, PA-C as PCP - General Jerline Pain, MD as PCP - Cardiology (Cardiology) Vicenta Aly, [place] as Nurse Practitioner (Nurse Practitioner)  Hematological/Oncological History 1) 08/20/2020: Presented to the emergency room for left lower leg swelling x1 day. Doppler US revealed nonocclusive acute DVT involving the distal left femoral, popliteal and peroneal veins.  Patient [name] recent diagnosis of COVID and DVT was felt to be provoked by this.  Patient initiated anticoagulation with Xarelto.  2) 12/15/2020: Doppler ultrasound revealed age-indeterminate DVT involving the left femoral vein, left popliteal vein and left peroneal vein.  Findings appeared essentially unchanged compared to the previous examination  3) 04/14/2021: Doppler ultrasound revealed continued age-indeterminate thrombosis involving the mid femoral vein, distal femoral vein, popliteal vein and peroneal veins.  4) 05/17/2021: Establish care with [facility] Hematology/Oncology  CHIEF COMPLAINTS/PURPOSE OF CONSULTATION:  Hx of left lower leg DVT  HISTORY OF PRESENTING ILLNESS:  Ryan [name] 56 y.o. male returns for left lower extremity DVT while on maintenance Xarelto therapy.  He is unaccompanied for this visit.  On exam today, Ryan [name] that his energy levels have improved in the last few months.  He is able to do more active and complete his ADLs on his own.  He denies any appetite changes or weight loss.  He denies any nausea, vomiting or abdominal pain.  His bowel habits are unchanged without any recurrent episodes of diarrhea constipation.  He denies easy bruising or signs of active bleeding.  He has noticed some diffuse muscle aches when he wakes up in the morning.  He stays hydrated and denies any dietary changes.  He [name] swelling in the left leg has improved since the last visit.  He  denies any fevers, chills, night sweats, shortness of breath, chest pain or cough.  He has no other complaints.  Rest of the 10 point ROS is below  MEDICAL HISTORY:  Past Medical History:  Diagnosis Date   Allergy    mild   Asthma    athletic induced asthma age 59    Bronchitis    with viral infection nov/Dec 2019   COVID-19    Deviated septum    DVT (deep venous thrombosis) (HCC)    Hyperlipidemia    Nephritis 1972    SURGICAL HISTORY: Past Surgical History:  Procedure Laterality Date   APPENDECTOMY  1984    SOCIAL HISTORY: Social History   Socioeconomic History   Marital status: Married    Spouse name: Not on file   Number of children: 4   Years of education: Not on file   Highest education level: Master's degree (e.g., MA, MS, MEng, MEd, MSW, MBA)  Occupational History   Occupation: Engineer, agricultural: [employer]  Tobacco Use   Smoking status: Never   Smokeless tobacco: Never  Vaping Use   Vaping Use: Never used  Substance and Sexual Activity   Alcohol use: Yes    Alcohol/week: 1.0 standard drink of alcohol    Types: 1 Standard drinks or equivalent per week    Comment: social   Drug use: No   Sexual activity: Yes    Birth control/protection: None  Other Topics Concern   Not on file  Social History Narrative   Not on file   Social Determinants of Health   Financial Resource Strain: Not on file  Food Insecurity: Not  on file  Transportation Needs: Not on file  Physical Activity: Insufficiently Active (02/05/2017)   Exercise Vital Sign    Days of Exercise per Week: 7 days    Minutes of Exercise per Session: 20 min  Stress: Not on file  Social Connections: Not on file  Intimate Partner Violence: Not on file    FAMILY HISTORY: Family History  Problem Relation Age of Onset   Alcohol abuse Mother    Heart attack Father    Diabetes Maternal Grandfather    Pancreatic cancer Maternal Aunt    Colon cancer Neg Hx     Colon polyps Neg Hx    Esophageal cancer Neg Hx    Rectal cancer Neg Hx    Stomach cancer Neg Hx    Allergic rhinitis Neg Hx    Angioedema Neg Hx    Asthma Neg Hx    Atopy Neg Hx    Eczema Neg Hx    Immunodeficiency Neg Hx    Urticaria Neg Hx     ALLERGIES:  has No Known Allergies.  MEDICATIONS:  Current Outpatient Medications  Medication Sig Dispense Refill   levothyroxine (SYNTHROID) 25 MCG tablet TAKE 1 TABLET BY MOUTH EVERY DAY BEFORE BREAKFAST 90 tablet 1   rivaroxaban (XARELTO) 10 MG TABS tablet Take 1 tablet (10 mg total) by mouth daily. 30 tablet 6   rosuvastatin (CRESTOR) 5 MG tablet Take 1 tablet (5 mg total) by mouth daily. 90 tablet 3   ciclesonide (ALVESCO) 160 MCG/ACT inhaler Inhale 2 puffs into the lungs 2 (two) times daily. (Patient not taking: Reported on 02/17/2021) 1 each 5   methocarbamol (ROBAXIN) 500 MG tablet Take 1 tablet (500 mg total) by mouth every 8 (eight) hours as needed for muscle spasms. (Patient not taking: Reported on 11/22/2021) 30 tablet 0   No current facility-administered medications for this visit.    REVIEW OF SYSTEMS:   Constitutional: ( - ) fevers, ( - )  chills , ( - ) night sweats Eyes: ( - ) blurriness of vision, ( - ) double vision, ( - ) watery eyes Ears, nose, mouth, throat, and face: ( - ) mucositis, ( - ) sore throat Respiratory: ( - ) cough, ( - ) dyspnea, ( - ) wheezes Cardiovascular: ( - ) palpitation, ( - ) chest discomfort, ( + ) lower extremity swelling Gastrointestinal:  ( - ) nausea, ( - ) heartburn, ( - ) change in bowel habits Skin: ( - ) abnormal skin rashes Lymphatics: ( - ) new lymphadenopathy, ( - ) easy bruising Neurological: ( - ) numbness, ( - ) tingling, ( - ) new weaknesses Behavioral/Psych: ( - ) mood change, ( - ) new changes  All other systems were reviewed with the patient and are negative.  PHYSICAL EXAMINATION: ECOG PERFORMANCE STATUS: 1 - Symptomatic but completely ambulatory  Vitals:   11/22/21 1438   BP: 105/74  Pulse: 70  Resp: 18  Temp: 97.7 F (36.5 C)  SpO2: 96%   Filed Weights   11/22/21 1438  Weight: 236 lb 2 oz (107.1 kg)    GENERAL: well appearing male in NAD  SKIN: skin color, texture, turgor are normal, no rashes or significant lesions EYES: conjunctiva are pink and non-injected, sclera clear OROPHARYNX: no exudate, no erythema; lips, buccal mucosa, and tongue normal  NECK: supple, non-tender LUNGS: clear to auscultation and percussion with normal breathing effort HEART: regular rate & rhythm and no murmurs. Improving left lower extremity edema without tenderness  or erythema.  ABDOMEN: soft, non-tender, non-distended, normal bowel sounds Musculoskeletal: no cyanosis of digits and no clubbing  PSYCH: alert & oriented x 3, fluent speech NEURO: no focal motor/sensory deficits  LABORATORY DATA:  I have reviewed the data as listed    Latest Ref Rng & Units 11/22/2021    2:14 PM 10/27/2021    9:49 AM 09/04/2021    9:18 AM  CBC  WBC 4.0 - 10.5 K/uL 8.1  5.7  6.1   Hemoglobin 13.0 - 17.0 g/dL 14.6  14.7  15.3   Hematocrit 39.0 - 52.0 % 42.1  43.5  45.3   Platelets 150 - 400 K/uL 237  234  259        Latest Ref Rng & Units 11/22/2021    2:14 PM 10/27/2021    9:49 AM 09/04/2021    9:18 AM  CMP  Glucose 70 - 99 mg/dL 93  89  103   BUN 6 - 20 mg/dL '21  20  21   '$ Creatinine 0.61 - 1.24 mg/dL 1.27  1.19  1.41   Sodium 135 - 145 mmol/L 138  137  139   Potassium 3.5 - 5.1 mmol/L 4.2  4.5  4.9   Chloride 98 - 111 mmol/L 108  102  103   CO2 22 - 32 mmol/L '22  20  20   '$ Calcium 8.9 - 10.3 mg/dL 9.2  9.2  9.3   Total Protein 6.5 - 8.1 g/dL 7.1  6.5  7.0   Total Bilirubin 0.3 - 1.2 mg/dL 0.7  0.6  0.5   Alkaline Phos 38 - 126 U/L 40  56  56   AST 15 - 41 U/L '19  18  19   '$ ALT 0 - 44 U/L '23  27  26     '$ RADIOGRAPHIC STUDIES: I have personally reviewed the radiological images as listed and agreed with the findings in the report. No results found.  ASSESSMENT & PLAN Ryan [name] is a 56 y.o. male returns for a follow up for history of left leg DVT.   #History of LLE DVT: --Diagnosed in June 2022 and felt to be provoked from COVID infection although infection was mild. 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 --No evidence of antiphospholipid syndrome based on labs form 05/17/2021.  --Currently on maintenance anticoagulation therapy with Xarelto 10 mg once a day. Patient prefers to continue on maintenance therapy to prevent risk of future clots. Okay to proceed at this time.  --No prohibitive toxicities including easy bruising or bleeding.  --Labs today reviewed and require no intervention.  --RTC in 6 months with labs  No orders of the defined types were placed in this encounter.   All questions were answered. The patient knows to call the clinic with any problems, questions or concerns.  I have spent a total of 25 minutes minutes of face-to-face and non-face-to-face time, preparing to see the patient, performing a medically appropriate examination, counseling and educating the patient, ordering medications/tests, documenting clinical information in the electronic health record, and care coordination.   Dede Query, PA-C Department of Hematology/Oncology [place] at [facility] Phone: [phone]

## 2021-11-27 IMAGING — US US EXTREM LOW VENOUS*L*
1 series · 14 of 24 positions shown · non-contrast
Comparison: None

CLINICAL DATA: LEFT leg swelling for 2-3 days, elevated D-dimer,
recently recovered from [id number]

EXAM:
LEFT LOWER EXTREMITY VENOUS DOPPLER ULTRASOUND
TECHNIQUE: Gray-scale sonography with compression, as well as color and duplex
ultrasound, were performed to evaluate the deep venous system(s)
from the level of the common femoral vein through the popliteal and
proximal calf veins.

[Series 1: us extrem low venous*left* · 14 of 50 slices shown]
[im 1/50]
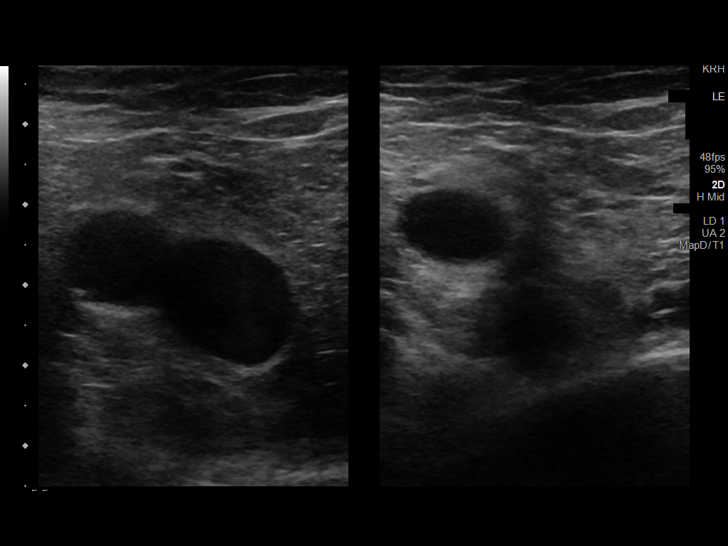
[im 5/50]
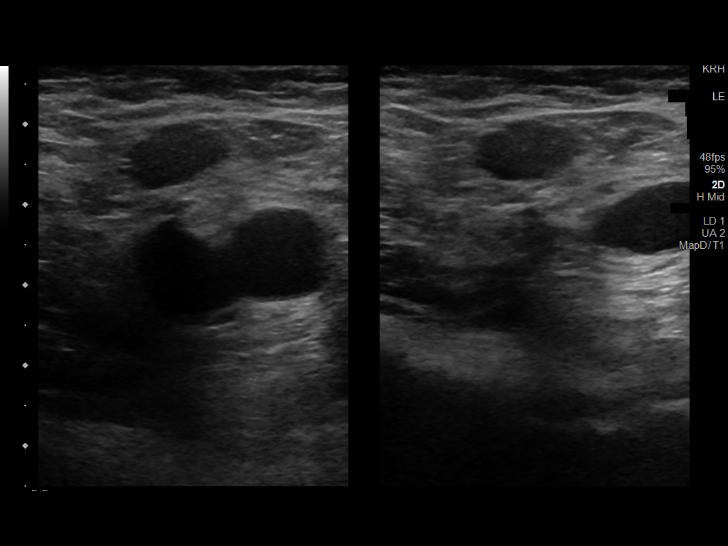
[im 9/50]
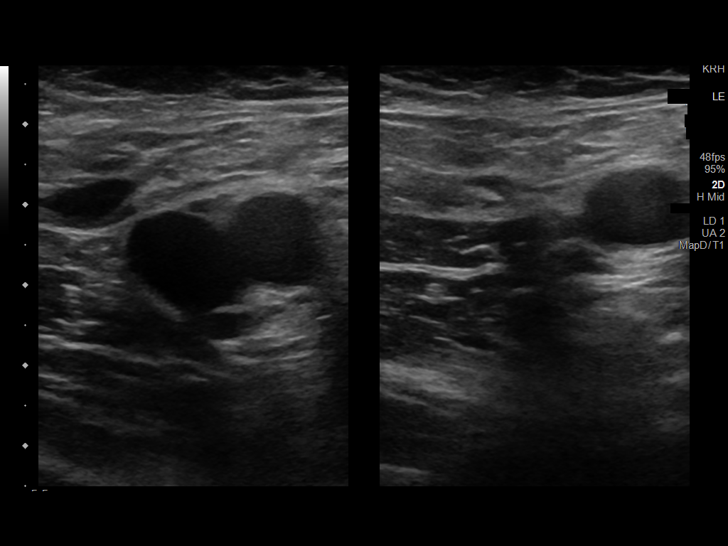
[im 13/50]
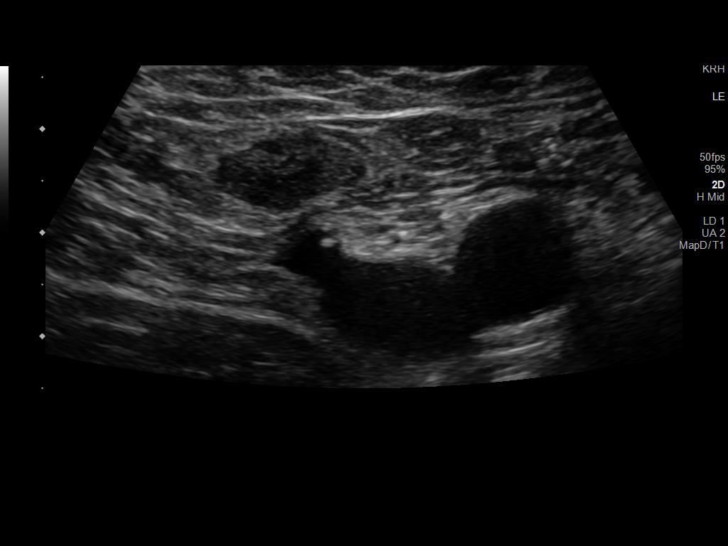
[im 15/50]
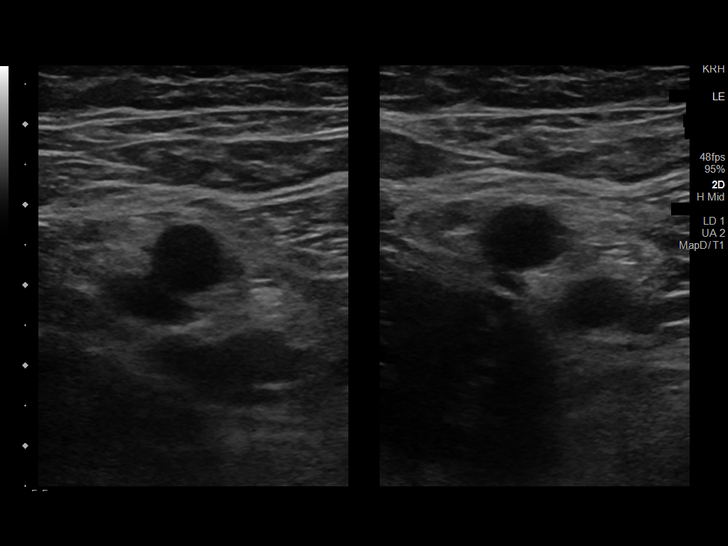
[im 20/50]
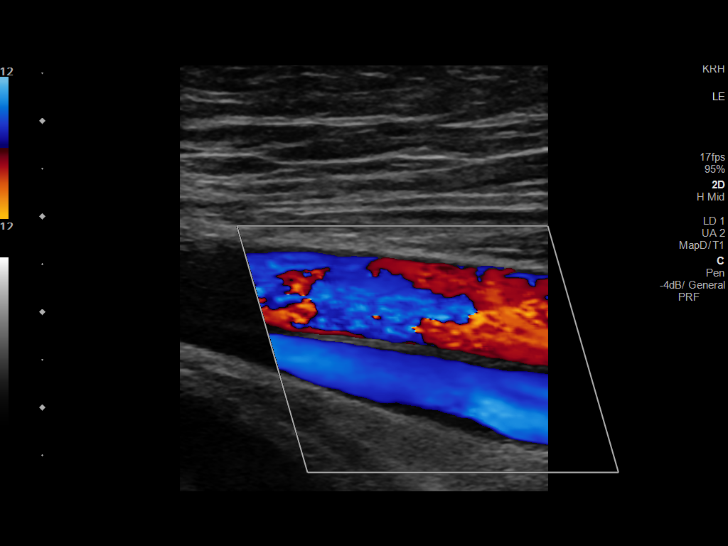
[im 24/50]
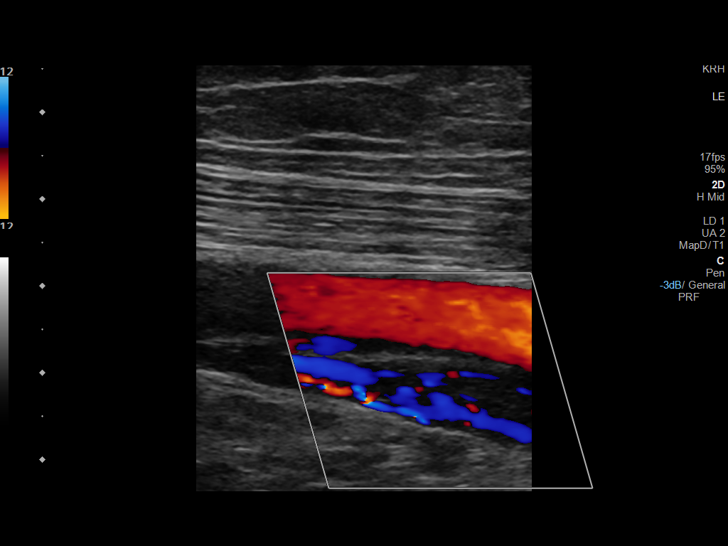
[im 26/50]
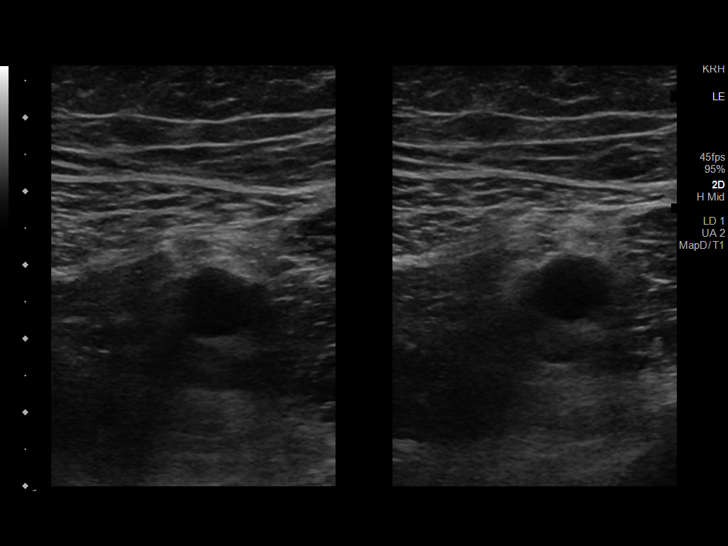
[im 30/50]
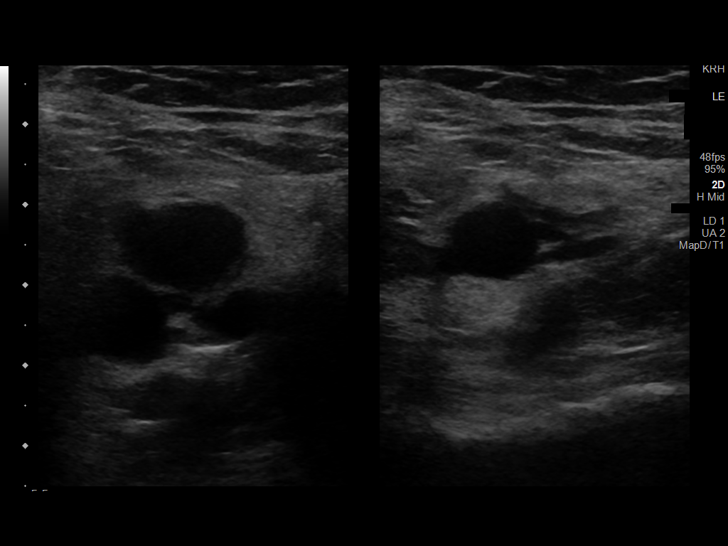
[im 35/50]
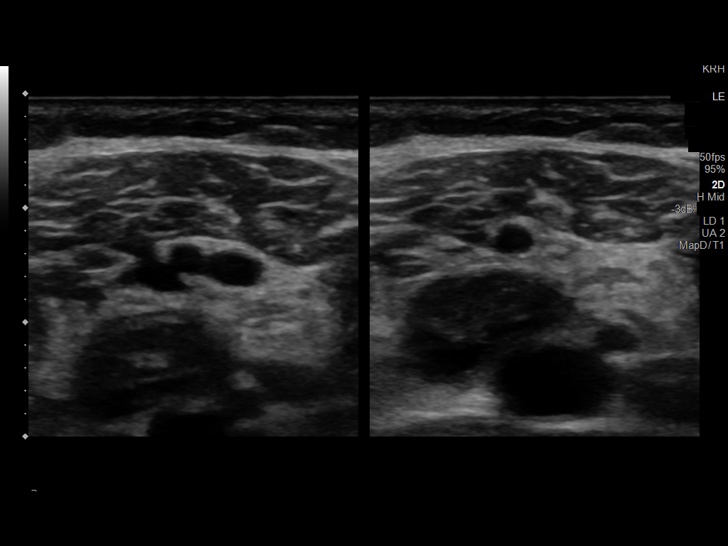
[im 39/50]
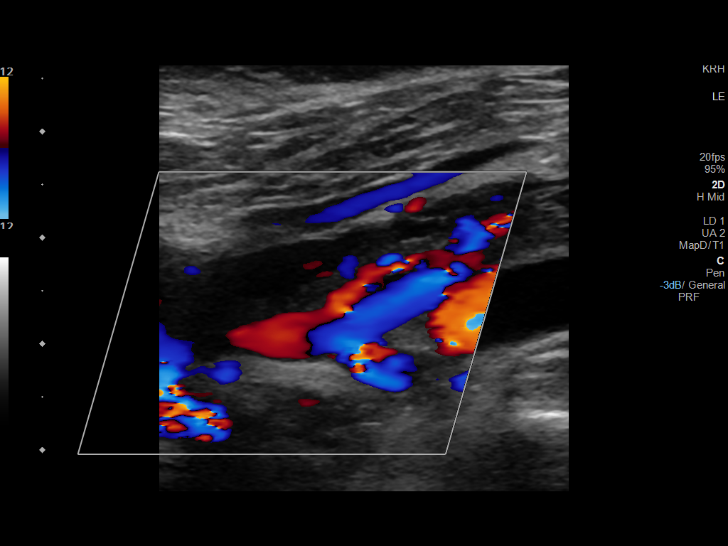
[im 41/50]
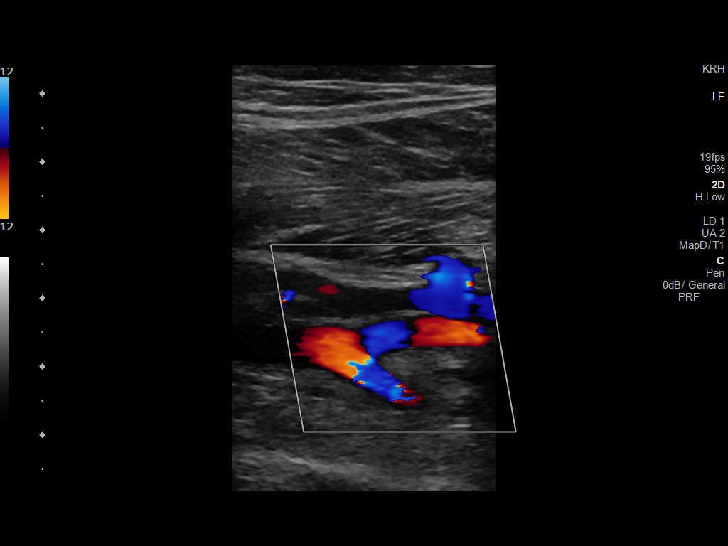
[im 45/50]
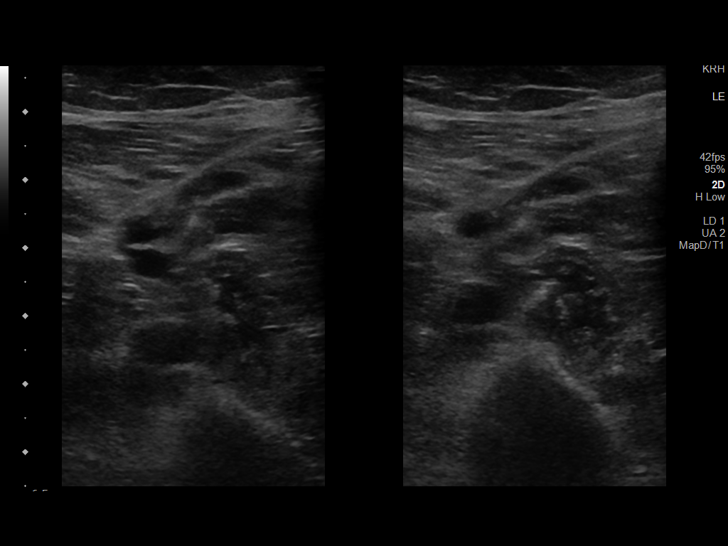
[im 50/50]
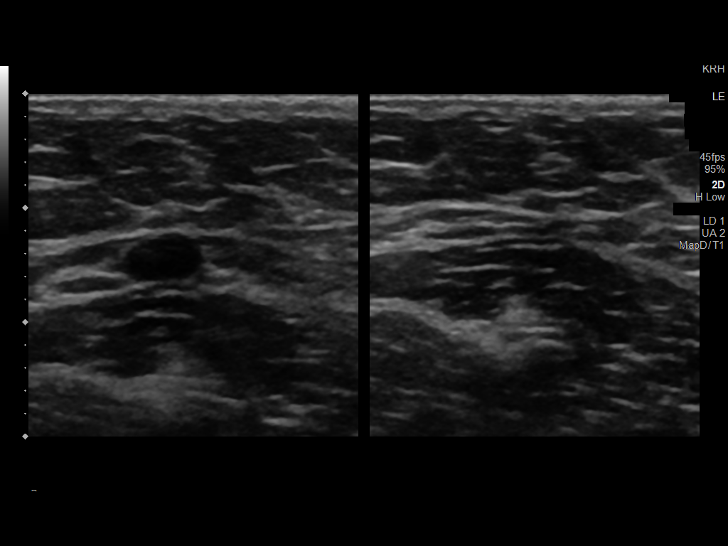

[14 of 24 positions shown; findings below may reference images not displayed]

FINDINGS: VENOUS

Nonocclusive hypoechoic thrombus is seen within distal LEFT femoral,
popliteal, and peroneal veins. The segments demonstrate impaired
spontaneous venous flow and impaired compressibility. Common
femoral, profundus femoral and proximal femoral veins appear patent.
Saphenofemoral junction patent.

Contralateral RIGHT common femoral vein is patent and compressible
with spontaneous flow.

OTHER

N/A

Limitations: none
IMPRESSION: Positive exam for presence of nonocclusive acute deep venous
thrombosis involving the distal LEFT femoral, popliteal, and
peroneal veins.

## 2021-12-05 ENCOUNTER — Encounter: Payer: Self-pay | Admitting: Physician Assistant

## 2021-12-05 ENCOUNTER — Ambulatory Visit (INDEPENDENT_AMBULATORY_CARE_PROVIDER_SITE_OTHER): Payer: BC Managed Care – PPO | Admitting: Physician Assistant

## 2021-12-05 VITALS — BP 107/70 | HR 77 | Ht 71.0 in | Wt 237.0 lb

## 2021-12-05 DIAGNOSIS — Z86718 Personal history of other venous thrombosis and embolism: Secondary | ICD-10-CM | POA: Diagnosis not present

## 2021-12-05 DIAGNOSIS — E038 Other specified hypothyroidism: Secondary | ICD-10-CM | POA: Diagnosis not present

## 2021-12-05 DIAGNOSIS — E785 Hyperlipidemia, unspecified: Secondary | ICD-10-CM

## 2021-12-05 DIAGNOSIS — Z23 Encounter for immunization: Secondary | ICD-10-CM

## 2021-12-05 NOTE — Assessment & Plan Note (Addendum)
-  Last lipid panel: HDL 44, LDL 88 -Discussed with patient potential side effects with statin therapy including myalgias and arthralgias. Recommend to hold rosuvastatin 5 mg for 4 weeks and provide me an update if symptoms have improved. If symptoms improve then recommend trial of pravastatin. Recommend to continue with Co-Q10 supplement.

## 2021-12-05 NOTE — Assessment & Plan Note (Signed)
-  Last TSH wnl. Continue current medication regimen.

## 2021-12-05 NOTE — Patient Instructions (Signed)
Stop Crestor 5 mg for 1 month and give me an update, if symptoms have improved then will consider changing to new statin.   Statin Intolerance Statin intolerance is the inability to take a certain type of cholesterol-lowering medicine (statin) because of unwanted side effects, such as muscle pain. Statins may be prescribed to decrease cholesterol levels and lower the risk for heart attack, heart disease, and stroke. People with statin intolerance may experience muscle pain and cramps (myalgia) that go away when the statin is stopped. What are the causes? The cause of this condition is not known. What increases the risk? You may be at higher risk for statin intolerance if you: Take more than one type of cholesterol-lowering medicine at a time. Need a higher-than-normal dosage of a statin. Have any of these medical problems: A history of surgery or trauma. Kidney, liver, joint, or muscle disease. A low level of hormones that control how your body uses energy (hypothyroidism). A lack of vitamin D (deficiency). Have a family history of muscle aches with or without statin therapy. Drink a lot of alcohol. Have any of these characteristics: Being older than 58 years of age. Having a body size that is smaller than normal. Being male. Being of Asian descent. Take certain medicines, including: Certain medicines for mental illness (antipsychotics). Some types of antibiotics or antifungals. Certain medicines used for blood pressure or heart disease. Medicines that reduce the activity of the body's disease-fighting system (immunosuppressants). Medicines to treat hepatitis C and HIV or AIDS (human immunodeficiency virus or acquired immunodeficiency syndrome). Follow an intense exercise program. Drink a lot of grapefruit juice. What are the signs or symptoms? Symptoms of statin intolerance include: General muscle aches, or myalgia. This may feel similar to muscle aches that are caused by the  flu. Muscle pain, tenderness, cramps, or weakness (myositis). Severe muscle pain, weakness, and raised blood CK levels (rhabdomyolysis). Symptoms usually go away when the statin is stopped. Rarely, liver damage can also occur, which can cause: Dark yellow or brown urine. Light-colored stool (feces). Loss of appetite. Pain in the upper right abdomen. Unusual weakness or fatigue. Yellowing of the skin or the white parts of the eyes (jaundice). How is this diagnosed? This condition is diagnosed based on your symptoms, your medical history, and a physical exam. You may also have blood tests. How is this treated? Your health care provider may have you stop taking the statin for a short time to see if your symptoms go away. Then your provider may restart your statin or: Change you to a different statin. Lower the dosage of your statin. Have you take your statin less often. Change you to another type of cholesterol-lowering medicine. Stop or change any medicines that might be interfering with your statin. Limit how much grapefruit juice you drink. Recommend stopping intense exercise. In most cases, statin intolerance can be managed, and you can continue to take a cholesterol-lowering medicine. Follow these instructions at home: Medicines Take your statin medicine as told by your health care provider. Do not stop taking the statin unless your health care provider tells you to stop. Take other over-the-counter and prescription medicines only as told by your health care provider. Check with your health care provider before taking any new medicines. Certain medicines can increase your risk for statin intolerance. Lifestyle If you drink alcohol: Limit how much you have to: 0-1 drink a day for women who are not pregnant. 0-2 drinks a day for men. Know how much alcohol is in  a drink. In the [place] one drink equals one 12 oz bottle of beer (355 mL), one 5 oz glass of wine (148 mL), or one 1 oz  glass of hard liquor (44 mL). Do not use any products that contain nicotine or tobacco. These products include cigarettes, chewing tobacco, and vaping devices, such as e-cigarettes. If you need help quitting, ask your health care provider. General instructions  Have blood tests to check CK levels or liver enzymes as told by your health care provider. Exercise as directed. Ask your health care provider what exercises are best for you. Do not start a new exercise program before talking about it with your health care provider. Follow instructions from your health care provider about eating or drinking restrictions. Your health care provider may recommend: Limiting the amount of grapefruit juice you drink, or not drinking it at all. Eating a diet that is low in saturated fats and high in fiber. Maintain a healthy weight with diet and exercise. Keep all follow-up visits. This is important. Contact a health care provider if: You have any symptoms of statin intolerance. Summary Statins are important medicines for decreasing your cholesterol, which may reduce your risk for heart attack and stroke. Some people are not able to continue taking a particular statin because of muscle problems (myalgia) or other side effects. Myalgia is the most common symptom of statin intolerance. Often, the muscle pain and cramps from myalgia go away when the statin is stopped. Although rare, liver damage can occur as a result of statin intolerance. You should have routine blood tests to check your liver enzymes. In most cases, statin intolerance can be managed, and you can continue to take a cholesterol-lowering medicine. This information is not intended to replace advice given to you by your health care provider. Make sure you discuss any questions you have with your health care provider. Document Revised: 05/05/2020 Document Reviewed: 05/05/2020 Elsevier Patient Education  [place].

## 2021-12-05 NOTE — Assessment & Plan Note (Signed)
-  Reviewed hematology consult. Recommend to continue Xarelto 10 mg daily.

## 2021-12-05 NOTE — Progress Notes (Signed)
Established patient visit   Patient: Ryan [name]   DOB: 02-20-1966   56 y.o. Male  MRN: [id number] Visit Date: 12/05/2021  Chief Complaint  Patient presents with   Follow-up    Patient presents today for follow up. He things he is seeing to many doctors. He doesn't see any real changes   Subjective    HPI HPI     Follow-up    Additional comments: Patient presents today for follow up. He things he is seeing to many doctors. He doesn't see any real changes      Last edited by Pennelope Bracken, CMA on 12/05/2021  3:46 PM.      Patient presents for chronic follow-up visit.   Thyroid: Reports medication compliance. Denies hoarseness or palpitations. States fatigue has improved since starting B12 and Vit D supplements.  HLD: Pt taking medication as directed. Taking Co-Q10 supplement. Patient reports having pain at multiple muscles which changes location. No diet changes. Continues to stay as active as possible.   Medications: Outpatient Medications Prior to Visit  Medication Sig   levothyroxine (SYNTHROID) 25 MCG tablet TAKE 1 TABLET BY MOUTH EVERY DAY BEFORE BREAKFAST   rivaroxaban (XARELTO) 10 MG TABS tablet Take 1 tablet (10 mg total) by mouth daily.   rosuvastatin (CRESTOR) 5 MG tablet Take 1 tablet (5 mg total) by mouth daily.   No facility-administered medications prior to visit.    Review of Systems Review of Systems:  A fourteen system review of systems was performed and found to be positive as per HPI.  Last CBC Lab Results  Component Value Date   WBC 8.1 11/22/2021   HGB 14.6 11/22/2021   HCT 42.1 11/22/2021   MCV 89.8 11/22/2021   MCH 31.1 11/22/2021   RDW 13.0 11/22/2021   PLT 237 96/29/5284   Last metabolic panel Lab Results  Component Value Date   GLUCOSE 93 11/22/2021   NA 138 11/22/2021   K 4.2 11/22/2021   CL 108 11/22/2021   CO2 22 11/22/2021   BUN 21 (H) 11/22/2021   CREATININE 1.27 (H) 11/22/2021   GFRNONAA >60 11/22/2021   CALCIUM 9.2  11/22/2021   PROT 7.1 11/22/2021   ALBUMIN 4.1 11/22/2021   LABGLOB 2.3 10/27/2021   AGRATIO 1.8 10/27/2021   BILITOT 0.7 11/22/2021   ALKPHOS 40 11/22/2021   AST 19 11/22/2021   ALT 23 11/22/2021   ANIONGAP 8 11/22/2021   Last lipids Lab Results  Component Value Date   CHOL 149 10/27/2021   HDL 44 10/27/2021   LDLCALC 88 10/27/2021   TRIG 91 10/27/2021   CHOLHDL 3.4 10/27/2021   Last hemoglobin A1c Lab Results  Component Value Date   HGBA1C 5.6 10/27/2021   Last thyroid functions Lab Results  Component Value Date   TSH 3.650 10/27/2021   T3TOTAL 101 05/27/2020   Last vitamin D Lab Results  Component Value Date   VD25OH 41.6 09/04/2021       Objective    BP 107/70   Pulse 77   Ht '5\' 11"'$  (1.803 m)   Wt 237 lb (107.5 kg)   SpO2 97%   BMI 33.05 kg/m  BP Readings from Last 3 Encounters:  12/05/21 107/70  11/22/21 105/74  10/13/21 103/72   Wt Readings from Last 3 Encounters:  12/05/21 237 lb (107.5 kg)  11/22/21 236 lb 2 oz (107.1 kg)  10/13/21 230 lb (104.3 kg)    Physical Exam  General:  Pleasant and cooperative, appropriate for  stated age.  Neuro:  Alert and oriented,  extra-ocular muscles intact  HEENT:  Normocephalic, atraumatic, neck supple  Skin:  no gross rash, warm, pink. Cardiac:  RRR, S1 S2 Respiratory: CTA B/L  Vascular:  Ext warm, no cyanosis apprec.; cap RF less 2 sec. Psych:  No HI/SI, judgement and insight good, Euthymic mood. Full Affect.   No results found for any visits on 12/05/21.  Assessment & Plan      Problem List Items Addressed This Visit       Endocrine   Subclinical hypothyroidism    -Last TSH wnl. Continue current medication regimen.          Other   Hyperlipidemia - Primary    -Last lipid panel: HDL 44, LDL 88 -Discussed with patient potential side effects with statin therapy including myalgias and arthralgias. Recommend to hold rosuvastatin 5 mg for 4 weeks and provide me an update if symptoms have  improved. If symptoms improve then recommend trial of pravastatin. Recommend to continue with Co-Q10 supplement.       History of DVT (deep vein thrombosis)    -Reviewed hematology consult. Recommend to continue Xarelto 10 mg daily.      Other Visit Diagnoses     Need for shingles vaccine       Relevant Orders   Zoster Recombinant (Shingrix ) (Completed)       Return for Thyroid, HLD, med management in 4-6 months.        Lorrene Reid, PA-C  [facility] at [facility] [phone] (phone) [phone] (fax)  [place]

## 2021-12-26 DIAGNOSIS — Z1211 Encounter for screening for malignant neoplasm of colon: Secondary | ICD-10-CM

## 2021-12-26 NOTE — Addendum Note (Signed)
Addended by: Mickel Crow on: 12/26/2021 08:37 PM   Modules accepted: Orders

## 2022-01-06 ENCOUNTER — Other Ambulatory Visit: Payer: Self-pay | Admitting: Physician Assistant

## 2022-01-28 ENCOUNTER — Other Ambulatory Visit: Payer: Self-pay | Admitting: Physician Assistant

## 2022-01-29 ENCOUNTER — Other Ambulatory Visit: Payer: Self-pay

## 2022-01-29 DIAGNOSIS — Z86718 Personal history of other venous thrombosis and embolism: Secondary | ICD-10-CM

## 2022-01-29 DIAGNOSIS — I82493 Acute embolism and thrombosis of other specified deep vein of lower extremity, bilateral: Secondary | ICD-10-CM

## 2022-01-29 MED ORDER — RIVAROXABAN 10 MG PO TABS: 10.0000 mg | ORAL_TABLET | Freq: Every day | ORAL | 4 refills | Status: DC

## 2022-03-13 ENCOUNTER — Telehealth: Payer: Self-pay

## 2022-03-13 DIAGNOSIS — E038 Other specified hypothyroidism: Secondary | ICD-10-CM

## 2022-03-13 MED ORDER — LEVOTHYROXINE SODIUM 25 MCG PO TABS: 25.0000 ug | ORAL_TABLET | Freq: Every day | ORAL | 0 refills | Status: DC

## 2022-03-13 NOTE — Telephone Encounter (Signed)
Refill sent.

## 2022-03-13 NOTE — Telephone Encounter (Signed)
Pt is requesting a refill on: levothyroxine (SYNTHROID) 25 MCG tablet   Pharmacy: [facility] [place]  12/05/21 ROV 04/06/22

## 2022-03-28 ENCOUNTER — Telehealth: Payer: Self-pay | Admitting: Hematology and Oncology

## 2022-03-28 NOTE — Telephone Encounter (Signed)
Called patient to r/s 3/7 due to provider PAL. Left voicemail with new appointment information.

## 2022-04-04 NOTE — Telephone Encounter (Signed)
Pt rescheduled his appointment to February but sent this message with the reschedule:  I'm ok with that however, I have been off my cholesterol medication for 3 months in hopes of trying one that may not make me ache as much...per my vascular doctor's recommendation.  I feel better but would like a different option.  Please advise

## 2022-04-04 NOTE — Telephone Encounter (Signed)
Was he one who was supposed to be seen today?

## 2022-04-05 ENCOUNTER — Other Ambulatory Visit: Payer: Self-pay | Admitting: Nurse Practitioner

## 2022-04-05 DIAGNOSIS — E782 Mixed hyperlipidemia: Secondary | ICD-10-CM

## 2022-04-05 DIAGNOSIS — Z789 Other specified health status: Secondary | ICD-10-CM

## 2022-04-05 MED ORDER — EZETIMIBE 10 MG PO TABS: 10.0000 mg | ORAL_TABLET | Freq: Every day | ORAL | 1 refills | Status: DC

## 2022-04-06 ENCOUNTER — Ambulatory Visit: Payer: BC Managed Care – PPO | Admitting: Nurse Practitioner

## 2022-04-23 NOTE — Telephone Encounter (Signed)
Pt is wanting to know if there is something he can take for COVID.   Current symptoms are Low grade fever, sinus congestion, weakness, watery eyes, mild headache.   Also,  since I developed dvt bloodclots in my leg from covid about a year and a half ago should I increase my xeralto from '10mg'$  to '20mg'$  per day?      What is recommendation for when to test again and for return to work?   Please advise

## 2022-04-27 NOTE — Telephone Encounter (Signed)
Would highly recommend that he do an e-visit since it sounds like he is higher risk and we do not have availability or okay to put into an acute slot.  But I would recommend that he do an e-visit ASAP.

## 2022-05-15 NOTE — Progress Notes (Addendum)
Established Patient Office Visit  Subjective   Patient ID: Ryan [name], male    DOB: 20-Sep-1965  Age: 57 y.o. MRN: [id number]  Chief Complaint  Patient presents with   Medical Management of Chronic Issues    Non Fasting   Hypothyroidism   Hyperlipidemia    HPI Ryan [name] is a 57 y.o. male presenting today for follow up of thyroid, hyperlipidemia. Hypothyroidism: Taking levothyroxine 25 mcg daily regularly in the AM away from food and vitamins. Denies fatigue, weight changes, heat/cold intolerance, skin/hair changes, bowel changes, CVS symptoms. Hyperlipidemia: tolerating Zetia well with no myalgias or significant side effects.  The 10-year ASCVD risk score (Arnett DK, et al., 2019) is: 4.9% His wife has also brought up that she has noticed some anxious tendencies and him.  He states that he has started paying attention and he can also see this.  He would prefer not to start medication but would be open to doing something to manage.  ROS Negative unless otherwise noted in HPI   Objective:     BP 114/76 (BP Location: Left Arm, Patient Position: Sitting, Cuff Size: Large)   Pulse 76   Resp 18   Ht '5\' 11"'$  (1.803 m)   Wt 232 lb (105.2 kg)   SpO2 95%   BMI 32.36 kg/m   Physical Exam Constitutional:      General: He is not in acute distress.    Appearance: Normal appearance.  HENT:     Head: Normocephalic and atraumatic.  Cardiovascular:     Rate and Rhythm: Normal rate and regular rhythm.     Pulses: Normal pulses.     Heart sounds: Normal heart sounds.  Pulmonary:     Effort: Pulmonary effort is normal.     Breath sounds: Normal breath sounds.  Skin:    General: Skin is warm and dry.  Neurological:     Mental Status: He is alert and oriented to person, place, and time.  Psychiatric:        Mood and Affect: Mood normal.     Assessment & Plan:  Subclinical hypothyroidism Assessment & Plan: Last TSH within normal limits.  Continue current medication regimen  unless TSH ordered today warrants adjustment.  Will continue to monitor.  Orders: -     TSH; Future  Mixed hyperlipidemia Assessment & Plan: Collecting lipid panel today.  He states that the Zetia has been much better tolerated than the rosuvastatin.  Continue Zetia 10 mg daily.  Will change medication therapy as needed based on cholesterol panel results.  Orders: -     Lipid panel; Future -     Comprehensive metabolic panel; Future -     CBC with Differential/Platelet; Future  Multiple nevi -     Ambulatory referral to Dermatology  Anxiety Assessment & Plan: We discussed starting options of therapy versus medications like SSRIs.  He would prefer not to start medications at this time but is interested in therapy.  I let him know that we can send a referral to somewhere where he can go to the psychology today website to find one that takes his insurance.  He will start by looking himself, but will let me know if he does need a referral to anywhere.  Will continue to monitor.   Ordered labs, he will come back when he is fasting to have them done.  I will be in touch with him if lab results indicate that we need to make any adjustments.  Sent in referral to dermatology as he does have fair skin with multiple nevi and spots that he would like an annual exam for.  Return in about 6 months (around 11/14/2022) for annual physical, fasting blood work 1 week before.    Ryan Harman, PA

## 2022-05-16 ENCOUNTER — Encounter: Payer: Self-pay | Admitting: Family Medicine

## 2022-05-16 ENCOUNTER — Ambulatory Visit: Payer: BC Managed Care – PPO | Admitting: Family Medicine

## 2022-05-16 VITALS — BP 114/76 | HR 76 | Resp 18 | Ht 71.0 in | Wt 232.0 lb

## 2022-05-16 DIAGNOSIS — F419 Anxiety disorder, unspecified: Secondary | ICD-10-CM | POA: Diagnosis not present

## 2022-05-16 DIAGNOSIS — E782 Mixed hyperlipidemia: Secondary | ICD-10-CM

## 2022-05-16 DIAGNOSIS — E038 Other specified hypothyroidism: Secondary | ICD-10-CM | POA: Diagnosis not present

## 2022-05-16 DIAGNOSIS — D229 Melanocytic nevi, unspecified: Secondary | ICD-10-CM | POA: Diagnosis not present

## 2022-05-16 NOTE — Patient Instructions (Signed)
Website to find a therapist: [email or web address]

## 2022-05-16 NOTE — Assessment & Plan Note (Signed)
Last TSH within normal limits.  Continue current medication regimen unless TSH ordered today warrants adjustment.  Will continue to monitor.

## 2022-05-16 NOTE — Assessment & Plan Note (Signed)
We discussed starting options of therapy versus medications like SSRIs.  He would prefer not to start medications at this time but is interested in therapy.  I let him know that we can send a referral to somewhere where he can go to the psychology today website to find one that takes his insurance.  He will start by looking himself, but will let me know if he does need a referral to anywhere.  Will continue to monitor.

## 2022-05-16 NOTE — Assessment & Plan Note (Signed)
Collecting lipid panel today.  He states that the Zetia has been much better tolerated than the rosuvastatin.  Continue Zetia 10 mg daily.  Will change medication therapy as needed based on cholesterol panel results.

## 2022-05-24 ENCOUNTER — Other Ambulatory Visit: Payer: BC Managed Care – PPO

## 2022-05-24 ENCOUNTER — Ambulatory Visit: Payer: BC Managed Care – PPO | Admitting: Hematology and Oncology

## 2022-05-28 ENCOUNTER — Other Ambulatory Visit: Payer: BC Managed Care – PPO

## 2022-05-28 DIAGNOSIS — E782 Mixed hyperlipidemia: Secondary | ICD-10-CM

## 2022-05-28 DIAGNOSIS — E038 Other specified hypothyroidism: Secondary | ICD-10-CM

## 2022-05-29 LAB — TSH: TSH: 3.35 u[IU]/mL (ref 0.450–4.500)

## 2022-05-29 LAB — CBC WITH DIFFERENTIAL/PLATELET
Basophils Absolute: 0.1 10*3/uL (ref 0.0–0.2)
Basos: 1 %
EOS (ABSOLUTE): 0.1 10*3/uL (ref 0.0–0.4)
Eos: 2 %
Hematocrit: 43.5 % (ref 37.5–51.0)
Hemoglobin: 14.7 g/dL (ref 13.0–17.7)
Immature Grans (Abs): 0 10*3/uL (ref 0.0–0.1)
Immature Granulocytes: 0 %
Lymphocytes Absolute: 2.2 10*3/uL (ref 0.7–3.1)
Lymphs: 33 %
MCH: 30.3 pg (ref 26.6–33.0)
MCHC: 33.8 g/dL (ref 31.5–35.7)
MCV: 90 fL (ref 79–97)
Monocytes Absolute: 0.5 10*3/uL (ref 0.1–0.9)
Monocytes: 8 %
Neutrophils Absolute: 3.8 10*3/uL (ref 1.4–7.0)
Neutrophils: 56 %
Platelets: 254 10*3/uL (ref 150–450)
RBC: 4.85 x10E6/uL (ref 4.14–5.80)
RDW: 12.9 % (ref 11.6–15.4)
WBC: 6.8 10*3/uL (ref 3.4–10.8)

## 2022-05-29 LAB — COMPREHENSIVE METABOLIC PANEL
ALT: 24 IU/L (ref 0–44)
AST: 17 IU/L (ref 0–40)
Albumin/Globulin Ratio: 1.7 (ref 1.2–2.2)
Albumin: 4.1 g/dL (ref 3.8–4.9)
Alkaline Phosphatase: 60 IU/L (ref 44–121)
BUN/Creatinine Ratio: 15 (ref 9–20)
BUN: 19 mg/dL (ref 6–24)
Bilirubin Total: 0.5 mg/dL (ref 0.0–1.2)
CO2: 23 mmol/L (ref 20–29)
Calcium: 9 mg/dL (ref 8.7–10.2)
Chloride: 108 mmol/L — ABNORMAL HIGH (ref 96–106)
Creatinine, Ser: 1.28 mg/dL — ABNORMAL HIGH (ref 0.76–1.27)
Globulin, Total: 2.4 g/dL (ref 1.5–4.5)
Glucose: 95 mg/dL (ref 70–99)
Potassium: 4.4 mmol/L (ref 3.5–5.2)
Sodium: 143 mmol/L (ref 134–144)
Total Protein: 6.5 g/dL (ref 6.0–8.5)
eGFR: 65 mL/min/{1.73_m2} (ref >59)

## 2022-05-29 LAB — LIPID PANEL
Chol/HDL Ratio: 4.1 ratio (ref 0.0–5.0)
Cholesterol, Total: 186 mg/dL (ref 100–199)
HDL: 45 mg/dL (ref >39)
LDL Chol Calc (NIH): 116 mg/dL — ABNORMAL HIGH (ref 0–99)
Triglycerides: 141 mg/dL (ref 0–149)
VLDL Cholesterol Cal: 25 mg/dL (ref 5–40)

## 2022-06-05 ENCOUNTER — Telehealth: Payer: Self-pay | Admitting: Hematology and Oncology

## 2022-06-05 NOTE — Telephone Encounter (Signed)
Called patient per 3/19 IB message to cancel 3/21 appointments. Left voicemail that appointment has been cancelled along with contact details for patient to call and reschedule appointments.

## 2022-06-07 ENCOUNTER — Ambulatory Visit: Payer: BC Managed Care – PPO | Admitting: Hematology and Oncology

## 2022-06-07 ENCOUNTER — Other Ambulatory Visit: Payer: Self-pay | Admitting: Hematology and Oncology

## 2022-06-07 ENCOUNTER — Other Ambulatory Visit: Payer: BC Managed Care – PPO

## 2022-06-07 DIAGNOSIS — Z86718 Personal history of other venous thrombosis and embolism: Secondary | ICD-10-CM

## 2022-06-08 ENCOUNTER — Other Ambulatory Visit: Payer: Self-pay | Admitting: Nurse Practitioner

## 2022-06-08 DIAGNOSIS — E038 Other specified hypothyroidism: Secondary | ICD-10-CM

## 2022-06-13 ENCOUNTER — Ambulatory Visit: Payer: BC Managed Care – PPO | Admitting: Dermatology

## 2022-06-13 ENCOUNTER — Encounter: Payer: Self-pay | Admitting: Dermatology

## 2022-06-13 DIAGNOSIS — D489 Neoplasm of uncertain behavior, unspecified: Secondary | ICD-10-CM

## 2022-06-13 DIAGNOSIS — L821 Other seborrheic keratosis: Secondary | ICD-10-CM

## 2022-06-13 DIAGNOSIS — L219 Seborrheic dermatitis, unspecified: Secondary | ICD-10-CM | POA: Diagnosis not present

## 2022-06-13 DIAGNOSIS — D2362 Other benign neoplasm of skin of left upper limb, including shoulder: Secondary | ICD-10-CM

## 2022-06-13 DIAGNOSIS — L300 Nummular dermatitis: Secondary | ICD-10-CM | POA: Diagnosis not present

## 2022-06-13 DIAGNOSIS — D235 Other benign neoplasm of skin of trunk: Secondary | ICD-10-CM | POA: Diagnosis not present

## 2022-06-13 MED ORDER — NYSTATIN-TRIAMCINOLONE 100000-0.1 UNIT/GM-% EX OINT: 1.0000 | TOPICAL_OINTMENT | Freq: Two times a day (BID) | CUTANEOUS | 2 refills | Status: DC

## 2022-06-13 MED ORDER — CLOBETASOL PROPIONATE 0.05 % EX CREA: 1.0000 | TOPICAL_CREAM | Freq: Two times a day (BID) | CUTANEOUS | 0 refills | Status: DC

## 2022-06-13 NOTE — Patient Instructions (Addendum)
Due to recent changes in healthcare laws, you may see results of your pathology and/or laboratory studies on MyChart before the doctors have had a chance to review them. We understand that in some cases there may be results that are confusing or concerning to you. Please understand that not all results are received at the same time and often the doctors may need to interpret multiple results in order to provide you with the best plan of care or course of treatment. Therefore, we ask that you please give [place] 2 business days to thoroughly review all your results before contacting the office for clarification. Should we see a critical lab result, you will be contacted sooner.   If You Need Anything After Your Visit  If you have any questions or concerns for your doctor, please call our main line at [phone] If no one answers, please leave a voicemail as directed and we will return your call as soon as possible. Messages left after 4 pm will be answered the following business day.   You may also send [place] a message via [place]. We typically respond to MyChart messages within 1-2 business days.  For prescription refills, please ask your pharmacy to contact our office. Our fax number is [phone].  If you have an urgent issue when the clinic is closed that cannot wait until the next business day, you can page your doctor at the number below.    Please note that while we do our best to be available for urgent issues outside of office hours, we are not available 24/7.   If you have an urgent issue and are unable to reach [place], you may choose to seek medical care at your doctor's office, retail clinic, urgent care center, or emergency room.  If you have a medical emergency, please immediately call 911 or go to the emergency department. In the event of inclement weather, please call our main line at [phone] for an update on the status of any delays or closures.  Dermatology Medication Tips: Please  keep the boxes that topical medications come in in order to help keep track of the instructions about where and how to use these. Pharmacies typically print the medication instructions only on the boxes and not directly on the medication tubes.   If your medication is too expensive, please contact our office at [phone] or send [place] a message through [place].   We are unable to tell what your co-pay for medications will be in advance as this is different depending on your insurance coverage. However, we may be able to find a substitute medication at lower cost or fill out paperwork to get insurance to cover a needed medication.   If a prior authorization is required to get your medication covered by your insurance company, please allow [place] 1-2 business days to complete this process.  Drug prices often vary depending on where the prescription is filled and some pharmacies may offer cheaper prices.  The website www.goodrx.com contains coupons for medications through different pharmacies. The prices here do not account for what the cost may be with help from insurance (it may be cheaper with your insurance), but the website can give you the price if you did not use any insurance.  - You can print the associated coupon and take it with your prescription to the pharmacy.  - You may also stop by our office during regular business hours and pick up a GoodRx coupon card.  - If you need your  prescription sent electronically to a different pharmacy, notify our office through [facility] or by phone at [phone]

## 2022-06-13 NOTE — Progress Notes (Signed)
New Patient Visit  Subjective  Ryan [name] is a 57 y.o. male who presents for the following: Spot check (Patient is here for a waist up skin exam. C/o spot on left leg, thinking it is eczema. Dry skin on face, ears, and scalp. Has tried OTC cortisone cream for itchy skin but the spot does not go away. Most spots of concern are on upper torso. Has previously had spots taken off but did not come back as abnormal. Last dermatology appointment was about three years ago. No hx of skin cancer and no family hx. ).    Objective  Well appearing patient in no apparent distress; mood and affect are within normal limits.  All skin waist up examined.  A dermatoscope was used during the exam.  The following people were also present during my examination: , my medical assistant (male)   Left Lower Leg - Anterior Ill-defined pink papules/plaques with scale-crust   Head - Anterior (Face) Scaly dry patches   Right Temple Stuck-on verrucous, tan-brown papules and plaques.   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 Emollients should be applied at least 2-3 times daily. Avoid scented detergents or fabric softeners. Keep fingernails short. Avoid excessive hand washing. Expectations: The patient is aware that eczema is chronic in nature and can improve with moisturizers and topical steroids and worsen with stress, scented soaps, detergents, scratching, dry skin, changes in weather and skin infections. Contact office if: Eczema worsens or fails to improve despite several weeks of treatment;  patient develops skin infections (such as: yellow honey colored crusts or cold sores).  I recommended the following: Moisturizers Rx topical corticosteroid  The following medication counseling was provided: I discussed with the patient that prolonged use of topical steroids can result in the increased appearance of superficial blood vessels (telangiectasias), lightening (hypopigmentation) and thinning of the skin (atrophy). Patient understands to avoid using high potency steroids in skin folds, the groin or the face. The patient verbalized understanding of the proper use and possible adverse effects of topical steroids. All of the patient's questions and concerns were addressed.   clobetasol cream (TEMOVATE) 0.05 % - Left Lower Leg - Anterior Apply 1 Application topically 2 (two) times daily. Apply twice daily to the leg for up to two weeks. Apply as needed for itch  Seborrheic dermatitis Head - Anterior (Face)  Plan: Counseling I counseled the patient regarding the following: Skin care: Emollients, shampoos with tar, selenium or zinc pyrithione can improve seborrheic dermatitis. Expectations: Seborrheic Dermatitis is chronic in nature with periods of remissions and flares. Flares can be triggered by stress. Contact office if: Seborrheic dermatitis worsens, or fails to improve despite several months of treatment.  I recommended the following: Topical Steroids  The following medication counseling was provided: I discussed with the patient that prolonged use of topical steroids can result in the increased appearance of superficial blood vessels (telangiectasias), lightening (hypopigmentation) and thinning of the skin (atrophy). Patient understands to avoid using high potency steroids in skin folds, the groin or the face. The patient verbalized understanding  of the proper use and possible adverse effects of topical steroids. All of the patient's questions and concerns were addressed.    nystatin-triamcinolone ointment (MYCOLOG) - Head - Anterior (Face) Apply 1 Application topically 2 (two) times daily. Apply to the face twice daily for up to one week. Repeat as needed  Seborrheic keratosis Right Temple  I counseled the patient regarding the following: Skin Care: Seborrheic Keratoses are benign. No treatment is necessary. Expectations: Seborrheic Keratoses are benign warty growths. Patients get more of them as they age.   Neoplasm of uncertain behavior (2) Left Shoulder - Anterior  Skin / nail biopsy Type of biopsy: tangential   Informed consent: discussed and consent obtained   Timeout: patient name, date of birth, surgical site, and procedure verified   Procedure prep:  Patient was prepped and draped in usual sterile fashion Prep type:  Isopropyl alcohol Anesthesia: the lesion was anesthetized in a standard fashion   Anesthetic:  1% lidocaine w/ epinephrine 1-100,000 buffered w/ 8.4% NaHCO3 Instrument used: DermaBlade   Hemostasis achieved with: aluminum chloride   Outcome: patient tolerated procedure well   Post-procedure details: sterile dressing applied and wound care instructions given   Dressing type: bandage and petrolatum    Specimen 1 - Surgical pathology Differential Diagnosis: rule out dysplastic nevus  Check Margins: No  Left Abdomen (side) - Lower  Skin / nail biopsy Type of biopsy: tangential   Informed consent: discussed and consent obtained   Timeout: patient name, date of birth, surgical site, and procedure verified   Procedure prep:  Patient was prepped and draped in usual sterile fashion Prep type:  Isopropyl alcohol Anesthesia: the lesion was anesthetized in a standard fashion   Anesthetic:  1% lidocaine w/ epinephrine 1-100,000 buffered w/ 8.4% NaHCO3 Instrument used: DermaBlade   Hemostasis achieved with: aluminum chloride   Outcome: patient tolerated procedure well   Post-procedure details: sterile dressing applied and wound  care instructions given   Dressing type: petrolatum    Specimen 2 - Surgical pathology Differential Diagnosis: rule out dysplastic nevus   Check Margins: No   LENTIGINES, SEBORRHEIC KERATOSES, HEMANGIOMAS - Benign normal skin lesions - Benign-appearing - Call for any changes  MELANOCYTIC NEVI - Tan-brown and/or pink-flesh-colored symmetric macules and papules - Benign appearing on exam today - Observation - Call clinic for new or changing moles - Recommend daily use of broad spectrum spf 30+ sunscreen to sun-exposed areas.   ACTINIC DAMAGE - Chronic condition, secondary to cumulative UV/sun exposure - diffuse scaly erythematous macules with underlying dyspigmentation - Recommend daily broad spectrum sunscreen SPF 30+ to sun-exposed areas, reapply every 2 hours as needed.  - Staying in the shade or wearing long sleeves, sun glasses (UVA+UVB protection) and wide brim hats (4-inch brim around the entire circumference of the hat) are also recommended for sun protection.  - Call for new or changing lesions.  SKIN CANCER SCREENING PERFORMED TODAY  Return in about 1 year (around 06/13/2023) for Annul skin exam.  I, Neva Hyler, CMA, am acting as scribe for Ellard Artis, MD.  Documentation: I have reviewed the above documentation for accuracy and completeness, and I agree with the above  [place], DO

## 2022-06-19 DIAGNOSIS — D239 Other benign neoplasm of skin, unspecified: Secondary | ICD-10-CM

## 2022-06-19 HISTORY — DX: Other benign neoplasm of skin, unspecified: D23.9

## 2022-07-05 ENCOUNTER — Telehealth: Payer: Self-pay

## 2022-07-05 NOTE — Telephone Encounter (Signed)
error 

## 2022-07-13 ENCOUNTER — Other Ambulatory Visit: Payer: Self-pay | Admitting: Physician Assistant

## 2022-07-13 DIAGNOSIS — I82493 Acute embolism and thrombosis of other specified deep vein of lower extremity, bilateral: Secondary | ICD-10-CM

## 2022-07-13 DIAGNOSIS — Z86718 Personal history of other venous thrombosis and embolism: Secondary | ICD-10-CM

## 2022-08-07 ENCOUNTER — Ambulatory Visit: Payer: BC Managed Care – PPO | Admitting: Dermatology

## 2022-08-07 ENCOUNTER — Encounter: Payer: Self-pay | Admitting: Dermatology

## 2022-08-07 DIAGNOSIS — D2362 Other benign neoplasm of skin of left upper limb, including shoulder: Secondary | ICD-10-CM | POA: Diagnosis not present

## 2022-08-07 DIAGNOSIS — D239 Other benign neoplasm of skin, unspecified: Secondary | ICD-10-CM

## 2022-08-07 MED ORDER — DOXYCYCLINE HYCLATE 100 MG PO TABS: 100.0000 mg | ORAL_TABLET | Freq: Two times a day (BID) | ORAL | 0 refills | Status: AC

## 2022-08-07 MED ORDER — MUPIROCIN 2 % EX OINT: 1.0000 | TOPICAL_OINTMENT | Freq: Two times a day (BID) | CUTANEOUS | 0 refills | Status: DC

## 2022-08-07 NOTE — Progress Notes (Signed)
   Follow-Up Visit   Subjective  Ryan [name] is a 57 y.o. male who presents for the following: Excision of Severe Dysplastic at the left anterior shoulder  The following portions of the chart were reviewed this encounter and updated as appropriate: medications, allergies, medical history  Review of Systems:  No other skin or systemic complaints except as noted in HPI or Assessment and Plan.  Objective  Well appearing patient in no apparent distress; mood and affect are within normal limits.  A focused examination was performed of the following areas: Left shoulder  Relevant physical exam findings are noted in the Assessment and Plan.   Left Shoulder - Anterior 6mm scar post biopsy       Assessment & Plan   Dysplastic nevus Left Shoulder - Anterior  Skin excision - Left Shoulder - Anterior  Lesion length (cm):  0.6 Lesion width (cm):  0.6 Margin per side (cm):  0.3 Total excision diameter (cm):  1.2 Informed consent: discussed and consent obtained   Timeout: patient name, date of birth, surgical site, and procedure verified   Procedure prep:  Patient was prepped and draped in usual sterile fashion Prep type:  Isopropyl alcohol and chlorhexidine Anesthesia: the lesion was anesthetized in a standard fashion   Anesthetic:  1% lidocaine w/ epinephrine 1-100,000 buffered w/ 8.4% NaHCO3 Instrument used: #15 blade   Hemostasis achieved with: pressure and electrodesiccation   Outcome: patient tolerated procedure well with no complications   Post-procedure details: sterile dressing applied and wound care instructions given   Dressing type: bandage and pressure dressing (mupirocin)    Skin repair - Left Shoulder - Anterior Complexity:  Complex Final length (cm):  4.5 Informed consent: discussed and consent obtained   Timeout: patient name, date of birth, surgical site, and procedure verified   Procedure prep:  Patient was prepped and draped in usual sterile fashion Prep  type:  Chlorhexidine Anesthesia: the lesion was anesthetized in a standard fashion   Anesthetic:  1% lidocaine w/ epinephrine 1-100,000 buffered w/ 8.4% NaHCO3 Reason for type of repair: reduce tension to allow closure, reduce the risk of dehiscence, infection, and necrosis, reduce subcutaneous dead space and avoid a hematoma, allow closure of the large defect, preserve normal anatomy, preserve normal anatomical and functional relationships and enhance both functionality and cosmetic results   Undermining: edges undermined   Undermining comment:  1 cm Subcutaneous layers (deep stitches):  Suture size:  3-0 Suture type: Vicryl (polyglactin 910)   Stitches:  Buried vertical mattress (buried vertical mattress) Fine/surface layer approximation (top stitches):  Suture size:  3-0 Suture type: nylon   Suture type comment:  Nylon Stitches: vertical mattress   Stitches comment:  Nylon Suture removal (days):  7 Hemostasis achieved with: suture and pressure Hemostasis achieved with comment:  Electrocautery Outcome: patient tolerated procedure well with no complications   Post-procedure details: sterile dressing applied and wound care instructions given   Dressing type: pressure dressing and bandage (Mupirocin)   Additional details:  Undermining Defect measured 1 cm  Specimen 1 - Surgical pathology Differential Diagnosis: Severe Dysplastic Nevus  Accession # [id number]  Check Margins: Yes     Return in about 2 weeks (around 08/21/2022) for Suture Removal.  I, Mosetta Anis, CMA, am acting as scribe for [employer], DO.   Documentation: I have reviewed the above documentation for accuracy and completeness, and I agree with the above.  Langston Reusing, DO

## 2022-08-07 NOTE — Patient Instructions (Signed)
AFTER- CARE OF SURGERY SITE 1. Leave the current dressing on for 18-24 hours and do not get it wet during that time. 2. After 24 hours wash site normally with mild soap/cleanser. 3. Apply a small amount of Mupirocin Ointment (This is a prescription sent to your local pharmacy) to the area. 4. Cover with a bandage using a non stick gauze pad and paper tape or Band Aid(s). 5. Repeat this twice each day for about 7 days, or as instructed by your provider. 6. Take the 100mg Doxycyline every morning and evening with a bid meal for 5 days.  Should bleeding occur, apply pressure for 10-15 minutes to the site. If bleeding continues, apply pressure again  for 10-15 minutes. If bleeding persists despite applying consistent pressure, call the office or go to the nearest emergency room.  Call the office for any questions or concerns. NOTE: Pathology results will be reviewed at the time the sutures (stitches) are removed or you will be notified with  the results (which usually takes 7 days)  Supplies for surgical site care: 1. Non stick gauze pads and Band Aids 2. Mild soap/cleanser (Dove, Lever 2000, Cetaphil, etc.) 3. Paper tape 4. Prescription Mupirocin Ointment or  Aquaphor healing ointment if you're waiting for your prescription.   Due to recent changes in healthcare laws, you may see results of your pathology and/or laboratory studies on MyChart before the doctors have had a chance to review them. We understand that in some cases there may be results that are confusing or concerning to you. Please understand that not all results are received at the same time and often the doctors may need to interpret multiple results in order to provide you with the best plan of care or course of treatment. Therefore, we ask that you please give us 2 business days to thoroughly review all your results before contacting the office for clarification. Should we see a critical lab result, you will be  contacted sooner.   If You Need Anything After Your Visit  If you have any questions or concerns for your doctor, please call our main line at [phone] If no one answers, please leave a voicemail as directed and we will return your call as soon as possible. Messages left after 4 pm will be answered the following business day.   You may also send us a message via MyChart. We typically respond to MyChart messages within 1-2 business days.  For prescription refills, please ask your pharmacy to contact our office. Our fax number is [phone].  If you have an urgent issue when the clinic is closed that cannot wait until the next business day, you can page your doctor at the number below.    Please note that while we do our best to be available for urgent issues outside of office hours, we are not available 24/7.   If you have an urgent issue and are unable to reach us, you may choose to seek medical care at your doctor's office, retail clinic, urgent care center, or emergency room.  If you have a medical emergency, please immediately call 911 or go to the emergency department. In the event of inclement weather, please call our main line at [phone] for an update on the status of any delays or closures.  Dermatology Medication Tips: Please keep the boxes that topical medications come in in order to help keep track of the instructions about where and how to use these. Pharmacies typically print the medication instructions   only on the boxes and not directly on the medication tubes.   If your medication is too expensive, please contact our office at [phone] or send us a message through MyChart.   We are unable to tell what your co-pay for medications will be in advance as this is different depending on your insurance coverage. However, we may be able to find a substitute medication at lower cost or fill out paperwork to get insurance to cover a needed medication.   If a prior  authorization is required to get your medication covered by your insurance company, please allow us 1-2 business days to complete this process.  Drug prices often vary depending on where the prescription is filled and some pharmacies may offer cheaper prices.  The website www.goodrx.com contains coupons for medications through different pharmacies. The prices here do not account for what the cost may be with help from insurance (it may be cheaper with your insurance), but the website can give you the price if you did not use any insurance.  - You can print the associated coupon and take it with your prescription to the pharmacy.  - You may also stop by our office during regular business hours and pick up a GoodRx coupon card.  - If you need your prescription sent electronically to a different pharmacy, notify our office through  MyChart or by phone at [phone]     

## 2022-08-13 ENCOUNTER — Other Ambulatory Visit: Payer: Self-pay | Admitting: Physician Assistant

## 2022-08-13 DIAGNOSIS — I82493 Acute embolism and thrombosis of other specified deep vein of lower extremity, bilateral: Secondary | ICD-10-CM

## 2022-08-13 DIAGNOSIS — Z86718 Personal history of other venous thrombosis and embolism: Secondary | ICD-10-CM

## 2022-08-21 ENCOUNTER — Encounter: Payer: Self-pay | Admitting: Dermatology

## 2022-08-21 ENCOUNTER — Ambulatory Visit (INDEPENDENT_AMBULATORY_CARE_PROVIDER_SITE_OTHER): Payer: BC Managed Care – PPO | Admitting: Dermatology

## 2022-08-21 DIAGNOSIS — Z4802 Encounter for removal of sutures: Secondary | ICD-10-CM

## 2022-08-21 NOTE — Progress Notes (Signed)
   Follow-Up Visit   Subjective  Ryan [name] is a 57 y.o. male who presents for the following: Suture removal  Pathology showed Dysplastic Nevus, margins free  The following portions of the chart were reviewed this encounter and updated as appropriate: medications, allergies, medical history  Review of Systems:  No other skin or systemic complaints except as noted in HPI or Assessment and Plan.  Objective  Well appearing patient in no apparent distress; mood and affect are within normal limits.  Areas Examined: Left anterior shoulder Relevant physical exam findings are noted in the Assessment and Plan.    Assessment & Plan    Encounter for Removal of Sutures - Incision site is clean, dry and intact. - Wound cleansed, sutures removed. - Discussed pathology results showing Dysplastic Nevus, margins free - Scars remodel for a full year. - Patient advised to call with any concerns or if they notice any new or changing lesions. -Massage area daily with ointment to aid in healing  No follow-ups on file.  Thressa Sheller Nyqvist

## 2022-08-29 ENCOUNTER — Encounter: Payer: Self-pay | Admitting: Family Medicine

## 2022-08-29 ENCOUNTER — Ambulatory Visit (INDEPENDENT_AMBULATORY_CARE_PROVIDER_SITE_OTHER): Payer: BC Managed Care – PPO | Admitting: Family Medicine

## 2022-08-29 VITALS — BP 120/82 | HR 74 | Resp 18 | Ht 71.0 in | Wt 237.0 lb

## 2022-08-29 DIAGNOSIS — R253 Fasciculation: Secondary | ICD-10-CM | POA: Diagnosis not present

## 2022-08-29 NOTE — Progress Notes (Signed)
Acute Office Visit  Subjective:     Patient ID: Ryan [name], male    DOB: 1965-10-27, 57 y.o.   MRN: [id number]  Chief Complaint  Patient presents with   Spasms         HPI Patient is in today for muscle twitching.  About 3 weeks ago, patient started getting intermittent twitching particularly in his left bicep and tricep.  He has also experienced some twitching muscles in his other arm and his legs.  Denies any pain, numbness, tingling.  He does endorse the feeling of having "tired muscles" especially in his left arm as if he had been holding it up in the air for a long period of time and finally got to lower it.  He tried discontinuing his Zetia with no relief.  He also thought that it might be an electrolyte imbalance and started eating more bananas and fruits which also did not offer any improvement.  Nothing like this has happened to the patient before.  Review of Systems  Constitutional:  Negative for chills, fever, malaise/fatigue and weight loss.  Respiratory:  Negative for shortness of breath.   Cardiovascular:  Negative for chest pain and palpitations.  Musculoskeletal:  Negative for back pain, joint pain, myalgias and neck pain.  Neurological:  Negative for dizziness, tingling, tremors, sensory change, speech change and headaches.     Objective:    BP 120/82 (BP Location: Left Arm, Patient Position: Sitting, Cuff Size: Normal)   Pulse 74   Resp 18   Ht 5\' 11"  (1.803 m)   Wt 237 lb (107.5 kg)   SpO2 98%   BMI 33.05 kg/m   Physical Exam Constitutional:      General: He is not in acute distress.    Appearance: Normal appearance.  HENT:     Head: Normocephalic and atraumatic.  Cardiovascular:     Rate and Rhythm: Normal rate and regular rhythm.     Pulses: Normal pulses.     Heart sounds: No murmur heard.    No friction rub. No gallop.  Pulmonary:     Effort: Pulmonary effort is normal.     Breath sounds: Normal breath sounds. No wheezing, rhonchi or rales.   Musculoskeletal:        General: No tenderness. Normal range of motion.     Comments: No evident swelling, deformity, tenderness.  ROM intact, does not cause pain or reproduce symptoms.  Skin:    General: Skin is warm and dry.  Neurological:     Mental Status: He is alert and oriented to person, place, and time.     Cranial Nerves: No cranial nerve deficit.     Motor: No weakness.     Coordination: Coordination normal.     Deep Tendon Reflexes: Reflexes normal.  Psychiatric:        Mood and Affect: Mood normal.      Assessment & Plan:  Muscle twitching -     CBC with Differential/Platelet; Future -     Comprehensive metabolic panel; Future -     Vitamin B12; Future -     Ambulatory referral to Neurology  Starting workup with CBC, CMP, vitamin B12 to check for any deficiencies/abnormalities that may be contributing to muscle twitching.  We discussed that next steps would be go to neurology to discuss possible nerve conduction studies.  Patient verbalized understanding and is agreeable to this plan.  He also noted while he was here that he mailed his Cologuard test  back 2 days ago.  No follow-ups on file.  Melida Quitter, PA

## 2022-08-30 ENCOUNTER — Encounter: Payer: Self-pay | Admitting: Neurology

## 2022-08-30 LAB — CBC WITH DIFFERENTIAL/PLATELET
Basophils Absolute: 0.1 10*3/uL (ref 0.0–0.2)
Basos: 1 %
EOS (ABSOLUTE): 0.1 10*3/uL (ref 0.0–0.4)
Eos: 2 %
Hematocrit: 44.7 % (ref 37.5–51.0)
Hemoglobin: 14.8 g/dL (ref 13.0–17.7)
Immature Grans (Abs): 0 10*3/uL (ref 0.0–0.1)
Immature Granulocytes: 0 %
Lymphocytes Absolute: 2.3 10*3/uL (ref 0.7–3.1)
Lymphs: 37 %
MCH: 30.1 pg (ref 26.6–33.0)
MCHC: 33.1 g/dL (ref 31.5–35.7)
MCV: 91 fL (ref 79–97)
Monocytes Absolute: 0.5 10*3/uL (ref 0.1–0.9)
Monocytes: 8 %
Neutrophils Absolute: 3.3 10*3/uL (ref 1.4–7.0)
Neutrophils: 52 %
Platelets: 283 10*3/uL (ref 150–450)
RBC: 4.91 x10E6/uL (ref 4.14–5.80)
RDW: 12.4 % (ref 11.6–15.4)
WBC: 6.2 10*3/uL (ref 3.4–10.8)

## 2022-08-30 LAB — COMPREHENSIVE METABOLIC PANEL
ALT: 31 IU/L (ref 0–44)
AST: 21 IU/L (ref 0–40)
Albumin/Globulin Ratio: 1.7
Albumin: 4.3 g/dL (ref 3.8–4.9)
Alkaline Phosphatase: 65 IU/L (ref 44–121)
BUN/Creatinine Ratio: 17 (ref 9–20)
BUN: 21 mg/dL (ref 6–24)
Bilirubin Total: 0.5 mg/dL (ref 0.0–1.2)
CO2: 22 mmol/L (ref 20–29)
Calcium: 9.1 mg/dL (ref 8.7–10.2)
Chloride: 105 mmol/L (ref 96–106)
Creatinine, Ser: 1.27 mg/dL (ref 0.76–1.27)
Globulin, Total: 2.6 g/dL (ref 1.5–4.5)
Glucose: 83 mg/dL (ref 70–99)
Potassium: 4.6 mmol/L (ref 3.5–5.2)
Sodium: 141 mmol/L (ref 134–144)
Total Protein: 6.9 g/dL (ref 6.0–8.5)
eGFR: 65 mL/min/{1.73_m2} (ref >59)

## 2022-08-30 LAB — VITAMIN B12: Vitamin B-12: 550 pg/mL (ref 232–1245)

## 2022-08-31 LAB — COLOGUARD: COLOGUARD: NEGATIVE

## 2022-09-04 NOTE — Progress Notes (Unsigned)
Initial neurology clinic note  Reason for Evaluation: Consultation requested by Melida Quitter, PA for an opinion regarding muscle twitching. My final recommendations will be communicated back to the requesting physician by way of shared medical record or letter to requesting physician via [place] mail.  HPI: This is [name], a 57 y.o. right-handed male with a medical history of HLD, DVT, hypothyroidism, asthma who presents to neurology clinic with the chief complaint of muscle twitching. The patient is alone today.  Patient has noticed symptoms for about 3 weeks. He is having muscle twitching. The twitching is mostly in the left arm (bicep and tricep area) for a few second occurring a few times a day. He has had 1 muscle twitch in the neck, right arm, and left leg as well. He mentions he had a dysplastic nevus removed on 08/07/22 and symptoms started a short time later, though patient does not necessarily think this is related. He denies other changes. He tried to be hydrated. He denies numbness or tingling, but the left arm feels tired, like he has been using the muscle. He denies weakness or atrophy though.  He has had muscle twitches in the past, which he previously attributed to exercising. He denies significant cramps. He can create a almost cramp sensation if he works his legs in the bed. He denies change in color of urine (dark like cola).  He was on Zetia. This was stopped without relief. He is back on it.  The patient denies symptoms suggestive of oculobulbar weakness including diplopia, ptosis, dysphagia, poor saliva control, dysarthria/dysphonia, impaired mastication, facial weakness/droop.  There are no neuromuscular respiratory weakness symptoms, particularly orthopnea>dyspnea.   Pseudobulbar affect is absent.  He does not report any constitutional symptoms like fever, night sweats, anorexia or unintentional weight loss.  EtOH use: Weekends, couple of beers   Restrictive diet? No Family history of neuropathy/myopathy/NM disease? No  Of note, patient is on Xarelto for DVT for 2 years.   MEDICATIONS:  Outpatient Encounter Medications as of 09/06/2022  Medication Sig   clobetasol cream (TEMOVATE) 0.05 % Apply 1 Application topically 2 (two) times daily. Apply twice daily to the leg for up to two weeks. Apply as needed for itch   ezetimibe (ZETIA) 10 MG tablet Take 1 tablet (10 mg total) by mouth daily.   levothyroxine (SYNTHROID) 25 MCG tablet TAKE 1 TABLET BY MOUTH ONCE DAILY BEFORE  BREAKFAST   mupirocin ointment (BACTROBAN) 2 % Apply 1 Application topically 2 (two) times daily. Apply twice daily for 7 days   nystatin-triamcinolone ointment (MYCOLOG) Apply 1 Application topically 2 (two) times daily. Apply to the face twice daily for up to one week. Repeat as needed   XARELTO 10 MG TABS tablet Take 1 tablet by mouth once daily   No facility-administered encounter medications on file as of 09/06/2022.    PAST MEDICAL HISTORY: Past Medical History:  Diagnosis Date   Allergy    mild   Asthma    athletic induced asthma age 19    Bronchitis    with viral infection nov/Dec 2019   COVID-19    Deviated septum    DVT (deep venous thrombosis) (HCC)    Dysplastic nevus 06/19/2022   Left anterior shoulder-Severe. Excised 08/07/2022   Hyperlipidemia    Nephritis 1972    PAST SURGICAL HISTORY: Past Surgical History:  Procedure Laterality Date   APPENDECTOMY  1984    ALLERGIES: No Known Allergies  FAMILY HISTORY: Family History  Problem Relation Age of Onset   Alcohol abuse Mother    Heart attack Father    Diabetes Maternal Grandfather    Pancreatic cancer Maternal Aunt    Colon cancer Neg Hx    Colon polyps Neg Hx    Esophageal cancer Neg Hx    Rectal cancer Neg Hx    Stomach cancer Neg Hx    Allergic rhinitis Neg Hx    Angioedema Neg Hx    Asthma Neg Hx    Atopy Neg Hx    Eczema Neg Hx    Immunodeficiency Neg Hx     Urticaria Neg Hx     SOCIAL HISTORY: Social History   Tobacco Use   Smoking status: Never    Passive exposure: Never   Smokeless tobacco: Never  Vaping Use   Vaping Use: Never used  Substance Use Topics   Alcohol use: Yes    Alcohol/week: 1.0 standard drink of alcohol    Types: 1 Standard drinks or equivalent per week    Comment: social   Drug use: No   Social History   Social History Narrative   Right handed   Caffeine: 1 cup/daily   1 level home w/ 10 steps living w/ spouse.     OBJECTIVE: PHYSICAL EXAM: BP 114/72 (BP Location: Left Arm, Patient Position: Sitting, Cuff Size: Large)   Pulse 76   Ht 5\' 11"  (1.803 m)   Wt 239 lb (108.4 kg)   SpO2 96%   BMI 33.33 kg/m   General: General appearance: Awake and alert. No distress. Cooperative with exam.  Skin: No obvious rash or jaundice. HEENT: Atraumatic. Anicteric. Lungs: Non-labored breathing on room air  Extremities: No edema. No obvious deformity.  Musculoskeletal: No obvious joint swelling. Psych: Affect appropriate.  Neurological: Mental Status: Alert. Speech fluent. No pseudobulbar affect Cranial Nerves: CNII: No RAPD. Visual fields grossly intact. CNIII, IV, VI: PERRL. No nystagmus. EOMI. CN V: Facial sensation intact bilaterally to fine touch. Masseter clench strong. Jaw jerk is negative. CN VII: Facial muscles symmetric and strong. No ptosis at rest. CN VIII: Hearing grossly intact bilaterally. CN IX: No hypophonia. CN X: Palate elevates symmetrically. CN XI: Full strength shoulder shrug bilaterally. CN XII: Tongue protrusion full and midline. No atrophy or fasciculations. No significant dysarthria Motor: Tone is normal. No fasciculations in appreciated in any extremities. No atrophy. Strength is 5/5 in bilateral upper and lower extremities. Reflexes:  Right Left   Bicep 2+ 2+   Tricep 2+ 2+   BrRad 2+ 2+   Knee 2+ 2+   Ankle 2+ 2+    Pathological Reflexes: Babinski: flexor response  bilaterally Hoffman: absent bilaterally Troemner: absent bilaterally Facial: absent bilaterally Midline tap: absent Sensation: Intact to pinprick in all extremities Coordination: Intact finger-to- nose-finger bilaterally. Gait: Normal, narrow-based gait.  Lab and Test Review: Internal labs: 08/29/22: CMP wnl CBC wnl (Cr 1.27) B12: 550  TSH (05/28/22): 3.350 Lipid panel (05/28/22):  Component     Latest Ref Rng 05/28/2022  Cholesterol, Total     100 - 199 mg/dL 409   Triglycerides     0 - 149 mg/dL 811   HDL Cholesterol     >39 mg/dL 45   VLDL Cholesterol Cal     5 - 40 mg/dL 25   LDL Chol Calc (NIH)     0 - 99 mg/dL 914 (H)   Total CHOL/HDL Ratio     0.0 - 5.0 ratio 4.1    HbA1c (10/27/21): 5.6  Vit D (09/04/21) wnl  ASSESSMENT: Ryan [name] is a 57 y.o. male who presents for evaluation of muscle twitching and left arm heaviness. He has a relevant medical history of HLD, DVT, hypothyroidism, asthma. His neurological examination is essentially normal, with no fasciculations seen on exam today. There are no worrisome findings on exam to suggest a more serious problem such as ALS or muscle disease. This could be electrolyte or thyroid related, or benign fasciculations. Given the arm heaviness, I will get EMG and monitor symptoms.  PLAN: -Blood work: TSH, PTH, vit D, ionized Ca -EMG: LUE -OTC recommendations for twitching given  -Return to clinic in 6 months  The impression above as well as the plan as outlined below were extensively discussed with the patient who voiced understanding. All questions were answered to their satisfaction.  When available, results of the above investigations and possible further recommendations will be communicated to the patient via telephone/MyChart. Patient to call office if not contacted after expected testing turnaround time.    Thank you for allowing me to participate in patient's care.  If I can answer any additional questions, I would be  pleased to do so.  Jacquelyne Balint, MD   CC: Melida Quitter, PA [address] [place] [address]  CC: Referring provider: Melida Quitter, PA [address] [place],  [place] [address]

## 2022-09-06 ENCOUNTER — Encounter: Payer: Self-pay | Admitting: Neurology

## 2022-09-06 ENCOUNTER — Telehealth: Payer: Self-pay | Admitting: Neurology

## 2022-09-06 ENCOUNTER — Ambulatory Visit: Payer: BC Managed Care – PPO | Admitting: Neurology

## 2022-09-06 VITALS — BP 114/72 | HR 76 | Ht 71.0 in | Wt 239.0 lb

## 2022-09-06 DIAGNOSIS — R253 Fasciculation: Secondary | ICD-10-CM | POA: Diagnosis not present

## 2022-09-06 DIAGNOSIS — R202 Paresthesia of skin: Secondary | ICD-10-CM | POA: Diagnosis not present

## 2022-09-06 DIAGNOSIS — E039 Hypothyroidism, unspecified: Secondary | ICD-10-CM | POA: Diagnosis not present

## 2022-09-06 NOTE — Telephone Encounter (Signed)
LVM--regarding lab location [address] [place] or 2nd floor endo. Notified pt after the appt. Need lab located 2nd floor same building. At Endo.

## 2022-09-06 NOTE — Telephone Encounter (Signed)
pt is calling back stating that he was not instructed as to where to go to have his labs done and he is in [place] looking over his AVS and need to know which [facility] he needs to go to. Pt would like a call back.

## 2022-09-06 NOTE — Patient Instructions (Addendum)
I would like to get blood work today.  I recommend a muscle and nerve test called EMG (see more information below). You can schedule this prior to leaving today.  We will discuss next steps after these tests.  - Recommend the following measures that may provide some symptomatic benefit for muscle twitching and/or cramps: - Adequate oral clear fluid intake to maintain optimal hydration (about 2.5 liters, or around 8-10 glasses per day) Avoidance of caffeine Trial of DIET tonic water: About 1 glass, up to 6 times daily Magnesium oxide up to 400 mg by mouth twice daily, as needed (over the counter) Gentle muscle stretching routine, especially before bedtime  I will see you back in clinic in 6 months or sooner if needed to re-evaluate you.  The physicians and staff at [facility] are committed to providing excellent care. You may receive a survey requesting feedback about your experience at our office. We strive to receive "very good" responses to the survey questions. If you feel that your experience would prevent you from giving the office a "very good " response, please contact our office to try to remedy the situation. We may be reached at [phone]. Thank you for taking the time out of your busy day to complete the survey.  Jacquelyne Balint, MD [facility]   ELECTROMYOGRAM AND NERVE CONDUCTION STUDIES (EMG/NCS) INSTRUCTIONS  How to Prepare The neurologist conducting the EMG will need to know if you have certain medical conditions. Tell the neurologist and other EMG lab personnel if you: Have a pacemaker or any other electrical medical device Take blood-thinning medications Have hemophilia, a blood-clotting disorder that causes prolonged bleeding Bathing Take a shower or bath shortly before your exam in order to remove oils from your skin. Don't apply lotions or creams before the exam.  What to Expect You'll likely be asked to change into a hospital gown for the procedure and  lie down on an examination table. The following explanations can help you understand what will happen during the exam.  Electrodes. The neurologist or a technician places surface electrodes at various locations on your skin depending on where you're experiencing symptoms. Or the neurologist may insert needle electrodes at different sites depending on your symptoms.  Sensations. The electrodes will at times transmit a tiny electrical current that you may feel as a twinge or spasm. The needle electrode may cause discomfort or pain that usually ends shortly after the needle is removed. If you are concerned about discomfort or pain, you may want to talk to the neurologist about taking a short break during the exam.  Instructions. During the needle EMG, the neurologist will assess whether there is any spontaneous electrical activity when the muscle is at rest - activity that isn't present in healthy muscle tissue - and the degree of activity when you slightly contract the muscle.  He or she will give you instructions on resting and contracting a muscle at appropriate times. Depending on what muscles and nerves the neurologist is examining, he or she may ask you to change positions during the exam.  After your EMG You may experience some temporary, minor bruising where the needle electrode was inserted into your muscle. This bruising should fade within several days. If it persists, contact your primary care doctor.

## 2022-09-07 ENCOUNTER — Other Ambulatory Visit (INDEPENDENT_AMBULATORY_CARE_PROVIDER_SITE_OTHER): Payer: BC Managed Care – PPO

## 2022-09-07 DIAGNOSIS — E039 Hypothyroidism, unspecified: Secondary | ICD-10-CM

## 2022-09-07 DIAGNOSIS — R253 Fasciculation: Secondary | ICD-10-CM

## 2022-09-07 DIAGNOSIS — R202 Paresthesia of skin: Secondary | ICD-10-CM

## 2022-09-07 NOTE — Addendum Note (Signed)
Addended by: Dishawn Bhargava S on: 09/07/2022 03:31 PM   Modules accepted: Orders  

## 2022-09-07 NOTE — Addendum Note (Signed)
Addended by: Clearnce Sorrel on: 09/07/2022 03:31 PM   Modules accepted: Orders

## 2022-09-08 LAB — VITAMIN D 25 HYDROXY (VIT D DEFICIENCY, FRACTURES): Vit D, 25-Hydroxy: 39 ng/mL (ref 30–100)

## 2022-09-08 LAB — TSH: TSH: 3.7 mIU/L (ref 0.40–4.50)

## 2022-09-10 LAB — CALCIUM, IONIZED: Calcium, Ion: 5 mg/dL (ref 4.7–5.5)

## 2022-09-10 LAB — PARATHYROID HORMONE, INTACT (NO CA): PTH: 22 pg/mL (ref 16–77)

## 2022-09-11 ENCOUNTER — Other Ambulatory Visit: Payer: Self-pay | Admitting: Physician Assistant

## 2022-09-11 DIAGNOSIS — Z86718 Personal history of other venous thrombosis and embolism: Secondary | ICD-10-CM

## 2022-09-11 DIAGNOSIS — I82493 Acute embolism and thrombosis of other specified deep vein of lower extremity, bilateral: Secondary | ICD-10-CM

## 2022-09-12 ENCOUNTER — Telehealth: Payer: Self-pay | Admitting: Hematology and Oncology

## 2022-09-16 NOTE — Progress Notes (Unsigned)
[facility] [phone]   [phone]  PROGRESS NOTE  Patient Care Team: Melida Quitter, PA as PCP - General (Family Medicine) Jake Bathe, MD as PCP - Cardiology (Cardiology) Elizabeth Palau, FNP as Nurse Practitioner (Nurse Practitioner)  Hematological/Oncological History 1) 08/20/2020: Presented to the emergency room for left lower leg swelling x1 day. Doppler US revealed nonocclusive acute DVT involving the distal left femoral, popliteal and peroneal veins.  Patient reports recent diagnosis of COVID and DVT was felt to be provoked by this.  Patient initiated anticoagulation with Xarelto.  2) 12/15/2020: Doppler ultrasound revealed age-indeterminate DVT involving the left femoral vein, left popliteal vein and left peroneal vein.  Findings appeared essentially unchanged compared to the previous examination  3) 04/14/2021: Doppler ultrasound revealed continued age-indeterminate thrombosis involving the mid femoral vein, distal femoral vein, popliteal vein and peroneal veins.  4) 05/17/2021: Establish care with [facility]cology  CHIEF COMPLAINTS/PURPOSE OF CONSULTATION:  Hx of left lower leg DVT  HISTORY OF PRESENTING ILLNESS:  Ryan [name] 57 y.o. male returns for left lower extremity DVT while on maintenance Xarelto therapy.  He is unaccompanied for this visit.  On exam today, Ryan [name] reports he has been "okay" overall in the room since her last visit.  He does have some occasional "twitching" in his upper left and right arms.  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  He notes he did not have any difficulty with bleeding while on the blood thinner during that procedure.  He notes overall he is tolerating Xarelto 10 mg well.  He is not have any bleeding or bruising, or dark stools.  He reports the cost is typically $37 per month, but sometimes $47 per month.  He has  no signs or symptoms concerning for recurrent VTE.  He denies any leg swelling, leg pain, chest pain, or shortness of breath.  Overall he is willing and able to continue on Xarelto therapy at this time.  He denies any fevers, chills, night sweats, shortness of breath, chest pain or cough.  He has no other complaints.  Rest of the 10 point ROS is below  MEDICAL HISTORY:  Past Medical History:  Diagnosis Date   Allergy    mild   Asthma    athletic induced asthma age 68    Bronchitis    with viral infection nov/Dec 2019   COVID-19    Deviated septum    DVT (deep venous thrombosis) (HCC)    Dysplastic nevus 06/19/2022   Left anterior shoulder-Severe. Excised 08/07/2022   Hyperlipidemia    Nephritis 1972    SURGICAL HISTORY: Past Surgical History:  Procedure Laterality Date   APPENDECTOMY  1984    SOCIAL HISTORY: Social History   Socioeconomic History   Marital status: Married    Spouse name: Not on file   Number of children: 4   Years of education: Not on file   Highest education level: Master's degree (e.g., MA, MS, MEng, MEd, MSW, MBA)  Occupational History   Occupation: Engineering geologist: [employer]  Tobacco Use   Smoking status: Never    Passive exposure: Never   Smokeless tobacco: Never  Vaping Use   Vaping Use: Never used  Substance and Sexual Activity   Alcohol use: Yes    Alcohol/week: 1.0 standard drink of alcohol    Types: 1 Standard drinks or equivalent  per week    Comment: social   Drug use: No   Sexual activity: Yes    Birth control/protection: None  Other Topics Concern   Not on file  Social History Narrative   Right handed   Caffeine: 1 cup/daily   1 level home w/ 10 steps living w/ spouse.   Social Determinants of Health   Financial Resource Strain: Not on file  Food Insecurity: Not on file  Transportation Needs: Not on file  Physical Activity: Insufficiently Active (02/05/2017)   Exercise Vital Sign     Days of Exercise per Week: 7 days    Minutes of Exercise per Session: 20 min  Stress: Not on file  Social Connections: Not on file  Intimate Partner Violence: Not on file    FAMILY HISTORY: Family History  Problem Relation Age of Onset   Alcohol abuse Mother    Heart attack Father    Diabetes Maternal Grandfather    Pancreatic cancer Maternal Aunt    Colon cancer Neg Hx    Colon polyps Neg Hx    Esophageal cancer Neg Hx    Rectal cancer Neg Hx    Stomach cancer Neg Hx    Allergic rhinitis Neg Hx    Angioedema Neg Hx    Asthma Neg Hx    Atopy Neg Hx    Eczema Neg Hx    Immunodeficiency Neg Hx    Urticaria Neg Hx     ALLERGIES:  has No Known Allergies.  MEDICATIONS:  Current Outpatient Medications  Medication Sig Dispense Refill   clobetasol cream (TEMOVATE) 0.05 % Apply 1 Application topically 2 (two) times daily. Apply twice daily to the leg for up to two weeks. Apply as needed for itch 30 g 0   ezetimibe (ZETIA) 10 MG tablet Take 1 tablet (10 mg total) by mouth daily. 90 tablet 1   levothyroxine (SYNTHROID) 25 MCG tablet TAKE 1 TABLET BY MOUTH ONCE DAILY BEFORE  BREAKFAST 90 tablet 1   mupirocin ointment (BACTROBAN) 2 % Apply 1 Application topically 2 (two) times daily. Apply twice daily for 7 days 22 g 0   nystatin-triamcinolone ointment (MYCOLOG) Apply 1 Application topically 2 (two) times daily. Apply to the face twice daily for up to one week. Repeat as needed 30 g 2   XARELTO 10 MG TABS tablet Take 1 tablet by mouth once daily 30 tablet 0   No current facility-administered medications for this visit.    REVIEW OF SYSTEMS:   Constitutional: ( - ) fevers, ( - )  chills , ( - ) night sweats Eyes: ( - ) blurriness of vision, ( - ) double vision, ( - ) watery eyes Ears, nose, mouth, throat, and face: ( - ) mucositis, ( - ) sore throat Respiratory: ( - ) cough, ( - ) dyspnea, ( - ) wheezes Cardiovascular: ( - ) palpitation, ( - ) chest discomfort, ( + ) lower extremity  swelling Gastrointestinal:  ( - ) nausea, ( - ) heartburn, ( - ) change in bowel habits Skin: ( - ) abnormal skin rashes Lymphatics: ( - ) new lymphadenopathy, ( - ) easy bruising Neurological: ( - ) numbness, ( - ) tingling, ( - ) new weaknesses Behavioral/Psych: ( - ) mood change, ( - ) new changes  All other systems were reviewed with the patient and are negative.  PHYSICAL EXAMINATION: ECOG PERFORMANCE STATUS: 1 - Symptomatic but completely ambulatory  There were no vitals filed for this visit.  There were no vitals filed for this visit.   GENERAL: well appearing male in NAD  SKIN: skin color, texture, turgor are normal, no rashes or significant lesions EYES: conjunctiva are pink and non-injected, sclera clear OROPHARYNX: no exudate, no erythema; lips, buccal mucosa, and tongue normal  NECK: supple, non-tender LUNGS: clear to auscultation and percussion with normal breathing effort HEART: regular rate & rhythm and no murmurs. Improving left lower extremity edema without tenderness or erythema.  ABDOMEN: soft, non-tender, non-distended, normal bowel sounds Musculoskeletal: no cyanosis of digits and no clubbing  PSYCH: alert & oriented x 3, fluent speech NEURO: no focal motor/sensory deficits  LABORATORY DATA:  I have reviewed the data as listed    Latest Ref Rng & Units 08/29/2022    9:49 AM 05/28/2022    8:52 AM 11/22/2021    2:14 PM  CBC  WBC 3.4 - 10.8 x10E3/uL 6.2  6.8  8.1   Hemoglobin 13.0 - 17.7 g/dL 78.2  95.6  21.3   Hematocrit 37.5 - 51.0 % 44.7  43.5  42.1   Platelets 150 - 450 x10E3/uL 283  254  237        Latest Ref Rng & Units 08/29/2022    9:49 AM 05/28/2022    8:52 AM 11/22/2021    2:14 PM  CMP  Glucose 70 - 99 mg/dL 83  95  93   BUN 6 - 24 mg/dL 21  19  21    Creatinine 0.76 - 1.27 mg/dL 0.86  5.78  4.69   Sodium 134 - 144 mmol/L 141  143  138   Potassium 3.5 - 5.2 mmol/L 4.6  4.4  4.2   Chloride 96 - 106 mmol/L 105  108  108   CO2 20 - 29 mmol/L 22   23  22    Calcium 8.7 - 10.2 mg/dL 9.1  9.0  9.2   Total Protein 6.0 - 8.5 g/dL 6.9  6.5  7.1   Total Bilirubin 0.0 - 1.2 mg/dL 0.5  0.5  0.7   Alkaline Phos 44 - 121 IU/L 65  60  40   AST 0 - 40 IU/L 21  17  19    ALT 0 - 44 IU/L 31  24  23      RADIOGRAPHIC STUDIES: I have personally reviewed the radiological images as listed and agreed with the findings in the report. No results found.  ASSESSMENT & PLAN Ryan [name] is a 57 y.o. male returns for a follow up for history of left leg DVT.   #History of LLE DVT: --Diagnosed in June 2022 and felt to be provoked from COVID infection although infection was mild. 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 --No evidence of antiphospholipid syndrome based on labs form 05/17/2021.  PLAN: --Currently on maintenance anticoagulation therapy with Xarelto 10 mg once a day. Patient prefers to continue on maintenance therapy to prevent risk of future clots.  --No prohibitive toxicities including easy bruising or bleeding.  --Labs today show Cr 1.26, white blood cell 6.9, hemoglobin 14.9, MCV 92.2, and platelets of 239 --RTC in 6 months with labs  No orders of the defined types were placed in this encounter.   All questions were answered. The patient knows to call the clinic with any problems, questions or concerns.  I have spent a total of 25 minutes minutes of face-to-face and non-face-to-face time, preparing to see the patient, performing a medically appropriate examination, counseling and educating the patient, ordering medications/tests, documenting clinical information in the electronic health record, and care coordination.   Ulysees Barns, MD Department of Hematology/Oncology [facility] Phone: [phone] Pager: [phone] Email: [email or web address]

## 2022-09-17 ENCOUNTER — Inpatient Hospital Stay: Payer: BC Managed Care – PPO | Admitting: Hematology and Oncology

## 2022-09-17 ENCOUNTER — Other Ambulatory Visit: Payer: Self-pay

## 2022-09-17 ENCOUNTER — Inpatient Hospital Stay: Payer: BC Managed Care – PPO | Attending: Hematology and Oncology

## 2022-09-17 VITALS — BP 114/84 | HR 66 | Temp 97.0°F | Resp 13 | Wt 238.8 lb

## 2022-09-17 DIAGNOSIS — I82493 Acute embolism and thrombosis of other specified deep vein of lower extremity, bilateral: Secondary | ICD-10-CM | POA: Diagnosis not present

## 2022-09-17 DIAGNOSIS — Z7901 Long term (current) use of anticoagulants: Secondary | ICD-10-CM | POA: Diagnosis not present

## 2022-09-17 DIAGNOSIS — E785 Hyperlipidemia, unspecified: Secondary | ICD-10-CM | POA: Insufficient documentation

## 2022-09-17 DIAGNOSIS — Z79899 Other long term (current) drug therapy: Secondary | ICD-10-CM | POA: Insufficient documentation

## 2022-09-17 DIAGNOSIS — Z86718 Personal history of other venous thrombosis and embolism: Secondary | ICD-10-CM | POA: Insufficient documentation

## 2022-09-17 DIAGNOSIS — Z800 Family history of malignant neoplasm of digestive organs: Secondary | ICD-10-CM | POA: Diagnosis not present

## 2022-09-17 LAB — CMP (CANCER CENTER ONLY)
ALT: 30 U/L (ref 0–44)
AST: 22 U/L (ref 15–41)
Albumin: 3.7 g/dL (ref 3.5–5.0)
Alkaline Phosphatase: 48 U/L (ref 38–126)
Anion gap: 4 — ABNORMAL LOW (ref 5–15)
BUN: 18 mg/dL (ref 6–20)
CO2: 27 mmol/L (ref 22–32)
Calcium: 8.7 mg/dL — ABNORMAL LOW (ref 8.9–10.3)
Chloride: 109 mmol/L (ref 98–111)
Creatinine: 1.26 mg/dL — ABNORMAL HIGH (ref 0.61–1.24)
GFR, Estimated: >60 mL/min (ref >60)
Glucose, Bld: 102 mg/dL — ABNORMAL HIGH (ref 70–99)
Potassium: 4.4 mmol/L (ref 3.5–5.1)
Sodium: 140 mmol/L (ref 135–145)
Total Bilirubin: 0.5 mg/dL (ref 0.3–1.2)
Total Protein: 6.7 g/dL (ref 6.5–8.1)

## 2022-09-17 LAB — CBC WITH DIFFERENTIAL (CANCER CENTER ONLY)
Abs Immature Granulocytes: 0.02 10*3/uL (ref 0.00–0.07)
Basophils Absolute: 0.1 10*3/uL (ref 0.0–0.1)
Basophils Relative: 1 %
Eosinophils Absolute: 0.1 10*3/uL (ref 0.0–0.5)
Eosinophils Relative: 1 %
HCT: 44.7 % (ref 39.0–52.0)
Hemoglobin: 14.9 g/dL (ref 13.0–17.0)
Immature Granulocytes: 0 %
Lymphocytes Relative: 32 %
Lymphs Abs: 2.2 10*3/uL (ref 0.7–4.0)
MCH: 30.7 pg (ref 26.0–34.0)
MCHC: 33.3 g/dL (ref 30.0–36.0)
MCV: 92.2 fL (ref 80.0–100.0)
Monocytes Absolute: 0.5 10*3/uL (ref 0.1–1.0)
Monocytes Relative: 7 %
Neutro Abs: 4 10*3/uL (ref 1.7–7.7)
Neutrophils Relative %: 59 %
Platelet Count: 239 10*3/uL (ref 150–400)
RBC: 4.85 MIL/uL (ref 4.22–5.81)
RDW: 13 % (ref 11.5–15.5)
WBC Count: 6.9 10*3/uL (ref 4.0–10.5)
nRBC: 0 % (ref 0.0–0.2)

## 2022-09-27 ENCOUNTER — Ambulatory Visit: Payer: BC Managed Care – PPO | Admitting: Neurology

## 2022-10-04 ENCOUNTER — Telehealth: Payer: Self-pay

## 2022-10-04 NOTE — Telephone Encounter (Signed)
L/m for pt to call back so we can schedule him to treat spot that had not yet been treated. He has a dn that needs a shave excision on his abdomen.

## 2022-10-05 ENCOUNTER — Other Ambulatory Visit: Payer: Self-pay | Admitting: Nurse Practitioner

## 2022-10-05 DIAGNOSIS — E782 Mixed hyperlipidemia: Secondary | ICD-10-CM

## 2022-10-05 DIAGNOSIS — Z789 Other specified health status: Secondary | ICD-10-CM

## 2022-10-08 ENCOUNTER — Other Ambulatory Visit: Payer: Self-pay | Admitting: Physician Assistant

## 2022-10-08 DIAGNOSIS — Z86718 Personal history of other venous thrombosis and embolism: Secondary | ICD-10-CM

## 2022-10-08 DIAGNOSIS — I82493 Acute embolism and thrombosis of other specified deep vein of lower extremity, bilateral: Secondary | ICD-10-CM

## 2022-10-15 ENCOUNTER — Ambulatory Visit: Payer: BC Managed Care – PPO | Admitting: Neurology

## 2022-10-15 ENCOUNTER — Telehealth: Payer: Self-pay | Admitting: Neurology

## 2022-10-15 DIAGNOSIS — E039 Hypothyroidism, unspecified: Secondary | ICD-10-CM

## 2022-10-15 DIAGNOSIS — R253 Fasciculation: Secondary | ICD-10-CM

## 2022-10-15 DIAGNOSIS — R202 Paresthesia of skin: Secondary | ICD-10-CM

## 2022-10-15 NOTE — Telephone Encounter (Signed)
Discussed the results of patient's EMG after the procedure today. It showed no significant abnormalities. There were isolated chronic neurogenic changes in the left triceps. This finding is too limited in degree and distribution for diagnostic purposes. It may be due to the residuals of an old pinched nerve in the neck, but again, not clear by EMG.  Patient is overall doing better, with less muscle twitching since last visit. He will follow up with me as planned in 02/2023.   All questions were answered.  Jacquelyne Balint, MD [facility]

## 2022-10-15 NOTE — Procedures (Signed)
  [facility]  [address] [place], [address]  [place], [place] [address] Tel: [phone] Fax: [phone] Test Date:  10/15/2022  Patient: Ryan [name] DOB: Aug 18, 1965 Physician: Jacquelyne Balint, MD  Sex: Male Height: 5\' 11"  Ref Phys: Jacquelyne Balint, MD  ID#: [id number]   Technician:    History: This is a 57 year old male with muscle twitches and left arm heaviness.  NCV & EMG Findings: Extensive electrodiagnostic evaluation of the left upper limb shows: Left median, ulnar, and radial sensory responses are within normal limits. Left median (APB) and ulnar (ADM) motor responses are within normal limits. Left ulnar (ADM) F wave latency is within normal limits. Chronic motor axon loss changes without active denervation changes are seen in the left triceps. All other tested muscles, including cervical paraspinal muscle (C7 level) are within normal limits with normal motor unit configuration and recruitment patterns.  Impression: This study shows no significant abnormalities. Specifically: No definitive electrodiagnostic evidence of a left cervical (C5-C8) motor radiculopathy. No electrodiagnostic evidence of a left median mononeuropathy at or distal to the wrist (ie: carpal tunnel syndrome). Screen studies for a left ulnar or radial mononeuropathy are normal. Chronic neurogenic changes isolated to the left triceps muscle are seen on needle examination, with no active or ongoing changes, that is of unclear significance. This finding is too limited in degree and distribution for diagnostic purposes.    ___________________________ Jacquelyne Balint, MD    Nerve Conduction Studies Motor Nerve Results    Latency Amplitude F-Lat Segment Distance CV Comment  Site (ms) Norm (mV) Norm (ms)  (cm) (m/s) Norm   Left Median (APB) Motor  Wrist 2.8  < 4.0 8.9  > 6.0        Elbow 8.5 - 8.4 -  Elbow-Wrist 32 56  > 50   Left Ulnar (ADM) Motor  Wrist 2.0  < 3.1 12.6  > 7.0 30.8       Bel elbow 5.8  - 12.2 -  Bel elbow-Wrist 22 58  > 50   Ab elbow 7.7 - 12.0 -  Ab elbow-Bel elbow 10 53 -    Sensory Sites    Neg Peak Lat Amplitude (O-P) Segment Distance Velocity Comment  Site (ms) Norm (V) Norm  (cm) (ms)   Left Median Sensory  Wrist-Dig II 3.4  < 3.6 27  > 15 Wrist-Dig II 13    Left Radial Sensory  Forearm-Wrist 2.4  < 2.7 24  > 14 Forearm-Wrist 10    Left Ulnar Sensory  Wrist-Dig V 2.6  < 3.1 19  > 10 Wrist-Dig V 11     Electromyography   Side Muscle Ins.Act Fibs Fasc Recrt Amp Dur Poly Activation Comment  Left FDI Nml Nml Nml Nml Nml Nml Nml Nml N/A  Left EIP Nml Nml Nml Nml Nml Nml Nml Nml N/A  Left Pronator teres Nml Nml Nml Nml Nml Nml Nml Nml N/A  Left Biceps Nml Nml Nml Nml Nml Nml Nml Nml N/A  Left Triceps Nml Nml Nml *2- *1+ *1+ *1+ Nml N/A  Left Deltoid Nml Nml Nml Nml Nml Nml Nml Nml N/A  Left C7 PSP Nml Nml Nml Nml Nml Nml Nml Nml N/A      Waveforms:  Motor      Sensory        F-Wave

## 2022-10-25 ENCOUNTER — Other Ambulatory Visit: Payer: Self-pay | Admitting: Family Medicine

## 2022-10-25 DIAGNOSIS — E785 Hyperlipidemia, unspecified: Secondary | ICD-10-CM

## 2022-10-25 DIAGNOSIS — E038 Other specified hypothyroidism: Secondary | ICD-10-CM

## 2022-10-25 DIAGNOSIS — R7989 Other specified abnormal findings of blood chemistry: Secondary | ICD-10-CM

## 2022-10-25 DIAGNOSIS — Z0000 Encounter for general adult medical examination without abnormal findings: Secondary | ICD-10-CM

## 2022-10-25 DIAGNOSIS — E782 Mixed hyperlipidemia: Secondary | ICD-10-CM

## 2022-11-09 ENCOUNTER — Other Ambulatory Visit: Payer: BC Managed Care – PPO

## 2022-11-09 DIAGNOSIS — E782 Mixed hyperlipidemia: Secondary | ICD-10-CM

## 2022-11-09 DIAGNOSIS — R7989 Other specified abnormal findings of blood chemistry: Secondary | ICD-10-CM

## 2022-11-09 DIAGNOSIS — E038 Other specified hypothyroidism: Secondary | ICD-10-CM

## 2022-11-09 DIAGNOSIS — Z0000 Encounter for general adult medical examination without abnormal findings: Secondary | ICD-10-CM

## 2022-11-10 ENCOUNTER — Other Ambulatory Visit: Payer: Self-pay | Admitting: Hematology and Oncology

## 2022-11-10 DIAGNOSIS — I82493 Acute embolism and thrombosis of other specified deep vein of lower extremity, bilateral: Secondary | ICD-10-CM

## 2022-11-10 DIAGNOSIS — Z86718 Personal history of other venous thrombosis and embolism: Secondary | ICD-10-CM

## 2022-11-10 LAB — COMPREHENSIVE METABOLIC PANEL
ALT: 27 IU/L (ref 0–44)
AST: 18 IU/L (ref 0–40)
Albumin: 4.2 g/dL (ref 3.8–4.9)
Alkaline Phosphatase: 64 IU/L (ref 44–121)
BUN/Creatinine Ratio: 13 (ref 9–20)
BUN: 16 mg/dL (ref 6–24)
Bilirubin Total: 0.5 mg/dL (ref 0.0–1.2)
CO2: 22 mmol/L (ref 20–29)
Calcium: 9.2 mg/dL (ref 8.7–10.2)
Chloride: 106 mmol/L (ref 96–106)
Creatinine, Ser: 1.23 mg/dL (ref 0.76–1.27)
Globulin, Total: 2.5 g/dL (ref 1.5–4.5)
Glucose: 86 mg/dL (ref 70–99)
Potassium: 4.4 mmol/L (ref 3.5–5.2)
Sodium: 141 mmol/L (ref 134–144)
Total Protein: 6.7 g/dL (ref 6.0–8.5)
eGFR: 68 mL/min/{1.73_m2} (ref >59)

## 2022-11-10 LAB — LIPID PANEL
Chol/HDL Ratio: 3.9 ratio (ref 0.0–5.0)
Cholesterol, Total: 175 mg/dL (ref 100–199)
HDL: 45 mg/dL (ref >39)
LDL Chol Calc (NIH): 111 mg/dL — ABNORMAL HIGH (ref 0–99)
Triglycerides: 102 mg/dL (ref 0–149)
VLDL Cholesterol Cal: 19 mg/dL (ref 5–40)

## 2022-11-10 LAB — HEMOGLOBIN A1C
Est. average glucose Bld gHb Est-mCnc: 114 mg/dL
Hgb A1c MFr Bld: 5.6 % (ref 4.8–5.6)

## 2022-11-10 LAB — TSH: TSH: 3.91 u[IU]/mL (ref 0.450–4.500)

## 2022-11-14 ENCOUNTER — Encounter: Payer: BC Managed Care – PPO | Admitting: Family Medicine

## 2022-11-30 ENCOUNTER — Other Ambulatory Visit: Payer: Self-pay | Admitting: Nurse Practitioner

## 2022-11-30 DIAGNOSIS — E038 Other specified hypothyroidism: Secondary | ICD-10-CM

## 2022-11-30 MED ORDER — LEVOTHYROXINE SODIUM 25 MCG PO TABS
25.0000 ug | ORAL_TABLET | Freq: Every day | ORAL | 1 refills | Status: DC
Start: 2022-11-30 — End: 2023-02-11

## 2022-12-09 ENCOUNTER — Other Ambulatory Visit: Payer: Self-pay | Admitting: Hematology and Oncology

## 2022-12-09 DIAGNOSIS — I82493 Acute embolism and thrombosis of other specified deep vein of lower extremity, bilateral: Secondary | ICD-10-CM

## 2022-12-09 DIAGNOSIS — Z86718 Personal history of other venous thrombosis and embolism: Secondary | ICD-10-CM

## 2022-12-14 ENCOUNTER — Other Ambulatory Visit: Payer: Self-pay | Admitting: Family Medicine

## 2022-12-14 DIAGNOSIS — Z789 Other specified health status: Secondary | ICD-10-CM

## 2022-12-14 DIAGNOSIS — E782 Mixed hyperlipidemia: Secondary | ICD-10-CM

## 2023-01-03 ENCOUNTER — Other Ambulatory Visit: Payer: Self-pay | Admitting: Family Medicine

## 2023-01-03 DIAGNOSIS — Z789 Other specified health status: Secondary | ICD-10-CM

## 2023-01-03 DIAGNOSIS — E782 Mixed hyperlipidemia: Secondary | ICD-10-CM

## 2023-01-10 ENCOUNTER — Other Ambulatory Visit: Payer: Self-pay | Admitting: Hematology and Oncology

## 2023-01-10 DIAGNOSIS — Z86718 Personal history of other venous thrombosis and embolism: Secondary | ICD-10-CM

## 2023-01-10 DIAGNOSIS — I82493 Acute embolism and thrombosis of other specified deep vein of lower extremity, bilateral: Secondary | ICD-10-CM

## 2023-01-11 ENCOUNTER — Other Ambulatory Visit: Payer: Self-pay | Admitting: Hematology and Oncology

## 2023-01-11 DIAGNOSIS — I82493 Acute embolism and thrombosis of other specified deep vein of lower extremity, bilateral: Secondary | ICD-10-CM

## 2023-01-11 DIAGNOSIS — Z86718 Personal history of other venous thrombosis and embolism: Secondary | ICD-10-CM

## 2023-01-15 ENCOUNTER — Other Ambulatory Visit: Payer: Self-pay | Admitting: Family Medicine

## 2023-01-15 DIAGNOSIS — Z789 Other specified health status: Secondary | ICD-10-CM

## 2023-01-15 DIAGNOSIS — E782 Mixed hyperlipidemia: Secondary | ICD-10-CM

## 2023-01-17 ENCOUNTER — Telehealth: Payer: Self-pay

## 2023-01-17 DIAGNOSIS — E782 Mixed hyperlipidemia: Secondary | ICD-10-CM

## 2023-01-17 DIAGNOSIS — Z789 Other specified health status: Secondary | ICD-10-CM

## 2023-01-17 MED ORDER — EZETIMIBE 10 MG PO TABS: 10.0000 mg | ORAL_TABLET | Freq: Every day | ORAL | 1 refills | Status: DC

## 2023-01-17 NOTE — Telephone Encounter (Signed)
Refill sent.

## 2023-01-17 NOTE — Telephone Encounter (Signed)
Prescription Request  01/17/2023  LOV:08/29/22  What is the name of the medication or equipment? ezetimibe (ZETIA) 10 MG tablet   Have you contacted your pharmacy to request a refill? Yes   Which pharmacy would you like this sent to?  [id number] - [place], [place] - [address] [place] [address] Phone: [phone] Fax: [phone]  Patient notified that their request is being sent to the clinical staff for review and that they should receive a response within 2 business days.   Please advise at Mobile [phone] (mobile)  Pt scheduled his CPE for 02/11/23

## 2023-02-11 ENCOUNTER — Ambulatory Visit (INDEPENDENT_AMBULATORY_CARE_PROVIDER_SITE_OTHER): Payer: BC Managed Care – PPO | Admitting: Family Medicine

## 2023-02-11 VITALS — BP 115/78 | HR 82 | Resp 18 | Ht 71.0 in | Wt 231.0 lb

## 2023-02-11 DIAGNOSIS — E782 Mixed hyperlipidemia: Secondary | ICD-10-CM

## 2023-02-11 DIAGNOSIS — Z789 Other specified health status: Secondary | ICD-10-CM

## 2023-02-11 DIAGNOSIS — E038 Other specified hypothyroidism: Secondary | ICD-10-CM | POA: Diagnosis not present

## 2023-02-11 DIAGNOSIS — Z23 Encounter for immunization: Secondary | ICD-10-CM

## 2023-02-11 DIAGNOSIS — Z0000 Encounter for general adult medical examination without abnormal findings: Secondary | ICD-10-CM | POA: Diagnosis not present

## 2023-02-11 MED ORDER — LEVOTHYROXINE SODIUM 25 MCG PO TABS: 25.0000 ug | ORAL_TABLET | Freq: Every day | ORAL | 1 refills | Status: DC

## 2023-02-11 MED ORDER — EZETIMIBE 10 MG PO TABS: 10.0000 mg | ORAL_TABLET | Freq: Every day | ORAL | 1 refills | Status: DC

## 2023-02-11 NOTE — Assessment & Plan Note (Addendum)
Last lipid panel: LDL 111, HDL 45, triglycerides 102.  Continue Zetia 10 mg daily.  Recommend magnesium glycinate supplement to improve myalgias if related to Zetia as well as improved sleep.  Will continue to monitor.

## 2023-02-11 NOTE — Progress Notes (Signed)
Complete physical exam  Patient: Ryan [name]   DOB: 12/13/1965   57 y.o. Male  MRN: [id number]  Subjective:    Chief Complaint  Patient presents with   Annual Exam    Ryan [name] is a 57 y.o. male who presents today for a complete physical exam. He reports consuming a  balanced  diet. He generally feels well. He reports having no issues falling asleep but consistently wakes up around 3 AM and starts his day.  He gets a total of about 6 hours of sleep each night which is an amount he can function on but he would like to stay asleep for longer. He also notes that after doing yard work or physical activity, he is sore for about 2 days after the activity which is not typical for him.  He is unsure if this is related to Zetia or getting older.   Most recent fall risk assessment:    09/06/2022   10:49 AM  Fall Risk   Falls in the past year? 0  Number falls in past yr: 0  Injury with Fall? 0     Most recent depression and anxiety screenings:    02/11/2023   10:23 AM 08/29/2022    9:37 AM  PHQ 2/9 Scores  PHQ - 2 Score 0 0  PHQ- 9 Score 0 0      02/11/2023   10:23 AM 08/29/2022    9:38 AM 12/14/2020    2:58 PM 09/01/2020    3:11 PM  GAD 7 : Generalized Anxiety Score  Nervous, Anxious, on Edge 1 1 0 0  Control/stop worrying 0 1 0 0  Worry too much - different things 0 1 0 0  Trouble relaxing 1 1 0 0  Restless 0 0 0 0  Easily annoyed or irritable 0 0 0 0  Afraid - awful might happen 0 0 0 0  Total GAD 7 Score 2 4 0 0  Anxiety Difficulty Not difficult at all Not difficult at all      Patient Active Problem List   Diagnosis Date Noted   Anxiety 05/16/2022   Personal history of COVID-19 09/17/2020   History of DVT (deep vein thrombosis) 09/17/2020   Subclinical hypothyroidism 08/06/2017   Obesity, Class I, BMI 30-34.9 08/06/2017   Hyperlipidemia 08/06/2017   Vitamin D insufficiency 03/05/2017   Environmental and seasonal allergies 02/05/2017    Past Surgical History:   Procedure Laterality Date   APPENDECTOMY  1984   Social History   Tobacco Use   Smoking status: Never    Passive exposure: Never   Smokeless tobacco: Never  Vaping Use   Vaping status: Never Used  Substance Use Topics   Alcohol use: Yes    Alcohol/week: 1.0 standard drink of alcohol    Types: 1 Standard drinks or equivalent per week    Comment: social   Drug use: No   Family History  Problem Relation Age of Onset   Alcohol abuse Mother    Heart attack Father    Heart disease Father    Diabetes Maternal Grandfather    Pancreatic cancer Maternal Aunt    Heart disease Paternal Grandfather    Arthritis Paternal Grandmother    Cancer Maternal Aunt    Colon cancer Neg Hx    Colon polyps Neg Hx    Esophageal cancer Neg Hx    Rectal cancer Neg Hx    Stomach cancer Neg Hx    Allergic rhinitis Neg  Hx    Angioedema Neg Hx    Asthma Neg Hx    Atopy Neg Hx    Eczema Neg Hx    Immunodeficiency Neg Hx    Urticaria Neg Hx    No Known Allergies   Patient Care Team: Melida Quitter, PA as PCP - General (Family Medicine) Jake Bathe, MD as PCP - Cardiology (Cardiology) Elizabeth Palau, FNP as Nurse Practitioner (Nurse Practitioner)   Outpatient Medications Prior to Visit  Medication Sig   clobetasol cream (TEMOVATE) 0.05 % Apply 1 Application topically 2 (two) times daily. Apply twice daily to the leg for up to two weeks. Apply as needed for itch   mupirocin ointment (BACTROBAN) 2 % Apply 1 Application topically 2 (two) times daily. Apply twice daily for 7 days   nystatin-triamcinolone ointment (MYCOLOG) Apply 1 Application topically 2 (two) times daily. Apply to the face twice daily for up to one week. Repeat as needed   XARELTO 10 MG TABS tablet Take 1 tablet by mouth once daily   [DISCONTINUED] ezetimibe (ZETIA) 10 MG tablet Take 1 tablet (10 mg total) by mouth daily.   [DISCONTINUED] levothyroxine (SYNTHROID) 25 MCG tablet Take 1 tablet (25 mcg total) by mouth daily  before breakfast.   No facility-administered medications prior to visit.    Review of Systems  Constitutional:  Negative for chills, fever and malaise/fatigue.  HENT:  Negative for congestion and hearing loss.   Eyes:  Negative for blurred vision and double vision.  Respiratory:  Negative for cough and shortness of breath.   Cardiovascular:  Negative for chest pain, palpitations and leg swelling.  Gastrointestinal:  Negative for abdominal pain, constipation, diarrhea and heartburn.  Genitourinary:  Negative for frequency and urgency.  Musculoskeletal:  Positive for myalgias (Muscles are more sore than he is used to after physical activity, unsure if this is related to Zetia). Negative for neck pain.  Neurological:  Negative for headaches.  Endo/Heme/Allergies:  Negative for polydipsia.  Psychiatric/Behavioral:  Negative for depression. The patient is not nervous/anxious.       Objective:    BP 115/78 (BP Location: Left Arm, Patient Position: Sitting, Cuff Size: Normal)   Pulse 82   Resp 18   Ht 5\' 11"  (1.803 m)   Wt 231 lb (104.8 kg)   SpO2 95%   BMI 32.22 kg/m    Physical Exam Constitutional:      General: He is not in acute distress.    Appearance: Normal appearance.  HENT:     Head: Normocephalic and atraumatic.     Right Ear: Tympanic membrane, ear canal and external ear normal. There is no impacted cerumen.     Left Ear: Tympanic membrane, ear canal and external ear normal. There is no impacted cerumen.     Nose: Nose normal.     Mouth/Throat:     Mouth: Mucous membranes are moist.     Pharynx: Oropharynx is clear. No posterior oropharyngeal erythema.  Eyes:     Extraocular Movements: Extraocular movements intact.     Conjunctiva/sclera: Conjunctivae normal.     Pupils: Pupils are equal, round, and reactive to light.     Comments: Wearing contacts  Neck:     Thyroid: No thyroid mass, thyromegaly or thyroid tenderness.  Cardiovascular:     Rate and Rhythm:  Normal rate and regular rhythm.     Heart sounds: Normal heart sounds. No murmur heard.    No friction rub. No gallop.  Pulmonary:  Effort: Pulmonary effort is normal. No respiratory distress.     Breath sounds: Normal breath sounds. No stridor. No wheezing or rales.  Abdominal:     General: Bowel sounds are normal.     Palpations: Abdomen is soft. There is no mass.     Tenderness: There is no abdominal tenderness.  Musculoskeletal:        General: Normal range of motion.     Cervical back: Normal range of motion and neck supple.  Lymphadenopathy:     Cervical: No cervical adenopathy.  Skin:    General: Skin is warm and dry.  Neurological:     Mental Status: He is alert and oriented to person, place, and time.     Cranial Nerves: No cranial nerve deficit.     Motor: No weakness.     Deep Tendon Reflexes: Reflexes normal.  Psychiatric:        Mood and Affect: Mood normal.        Assessment & Plan:    Routine Health Maintenance and Physical Exam  Immunization History  Administered Date(s) Administered   Hepatitis B, ADULT 05/14/2012, 06/18/2012   Hepatitis B, PED/ADOLESCENT 05/14/2012, 06/18/2012   Influenza,inj,Quad PF,6+ Mos 03/02/2019   Influenza-Unspecified 02/25/2020   Moderna Sars-Covid-2 Vaccination 05/16/2019, 06/13/2019, 02/25/2020   Tdap 03/20/2009, 04/14/2020   Zoster Recombinant(Shingrix) 04/14/2020, 12/05/2021    Health Maintenance  Topic Date Due   INFLUENZA VACCINE  10/18/2022   COVID-19 Vaccine (4 - 2023-24 season) 02/27/2023 (Originally 11/18/2022)   Fecal DNA (Cologuard)  08/26/2025   DTaP/Tdap/Td (3 - Td or Tdap) 04/14/2030   Hepatitis C Screening  Completed   HIV Screening  Completed   Zoster Vaccines- Shingrix  Completed   HPV VACCINES  Aged Out    Reviewed most recent labs including CBC, CMP, lipid panel, A1C, TSH, and vitamin D. All within normal limits/stable from last check other than LDL slightly elevated 111. Agreeable to influenza  vaccine today.  Discussed health benefits of physical activity, and encouraged him to engage in regular exercise appropriate for his age and condition.  Wellness examination  Subclinical hypothyroidism Assessment & Plan: TSH within normal limits.  Continue levothyroxine 25 mcg daily.  Will continue to monitor.  Orders: -     Levothyroxine Sodium; Take 1 tablet (25 mcg total) by mouth daily before breakfast.  Dispense: 90 tablet; Refill: 1  Mixed hyperlipidemia Assessment & Plan: Last lipid panel: LDL 111, HDL 45, triglycerides 102.  Continue Zetia 10 mg daily.  Recommend magnesium glycinate supplement to improve myalgias if related to Zetia as well as improved sleep.  Will continue to monitor.  Orders: -     Ezetimibe; Take 1 tablet (10 mg total) by mouth daily.  Dispense: 90 tablet; Refill: 1  Statin intolerance -     Ezetimibe; Take 1 tablet (10 mg total) by mouth daily.  Dispense: 90 tablet; Refill: 1  Need for influenza vaccination -     Flu vaccine trivalent PF, 6mos and older(Flulaval,Afluria,Fluarix,Fluzone)    Return in about 6 months (around 08/11/2023) for follow-up for HLD, thyroid, fasting blood work 1 week before.     Melida Quitter, PA

## 2023-02-11 NOTE — Patient Instructions (Signed)
Magnesium glycinate supplement: https://a.co/d/[email or web address] This supplement taken at bedtime should help with any muscle pains that might be related to medication as well as helping you to stay asleep.

## 2023-02-11 NOTE — Assessment & Plan Note (Signed)
TSH within normal limits.  Continue levothyroxine 25 mcg daily.  Will continue to monitor.

## 2023-02-18 ENCOUNTER — Other Ambulatory Visit: Payer: Self-pay | Admitting: Hematology and Oncology

## 2023-02-18 DIAGNOSIS — Z86718 Personal history of other venous thrombosis and embolism: Secondary | ICD-10-CM

## 2023-02-18 DIAGNOSIS — I82493 Acute embolism and thrombosis of other specified deep vein of lower extremity, bilateral: Secondary | ICD-10-CM

## 2023-02-25 NOTE — Progress Notes (Signed)
I saw Ryan [name] in neurology clinic on 03/06/23 in follow up for fasciculations.  HPI: Ryan [name] is a 57 y.o. year old male with a history of HLD, DVT, hypothyroidism, asthma who we last saw on 09/06/22.  To briefly review: Patient has noticed symptoms for about 3 weeks. He is having muscle twitching. The twitching is mostly in the left arm (bicep and tricep area) for a few second occurring a few times a day. He has had 1 muscle twitch in the neck, right arm, and left leg as well. He mentions he had a dysplastic nevus removed on 08/07/22 and symptoms started a short time later, though patient does not necessarily think this is related. He denies other changes. He tried to be hydrated. He denies numbness or tingling, but the left arm feels tired, like he has been using the muscle. He denies weakness or atrophy though.   He has had muscle twitches in the past, which he previously attributed to exercising. He denies significant cramps. He can create a almost cramp sensation if he works his legs in the bed. He denies change in color of urine (dark like cola).   He was on Zetia. This was stopped without relief. He is back on it.   The patient denies symptoms suggestive of oculobulbar weakness including diplopia, ptosis, dysphagia, poor saliva control, dysarthria/dysphonia, impaired mastication, facial weakness/droop.   There are no neuromuscular respiratory weakness symptoms, particularly orthopnea>dyspnea.    Pseudobulbar affect is absent.   He does not report any constitutional symptoms like fever, night sweats, anorexia or unintentional weight loss.   EtOH use: Weekends, couple of beers  Restrictive diet? No Family history of neuropathy/myopathy/NM disease? No   Of note, patient is on Xarelto for DVT for 2 years.  Most recent Assessment and Plan (09/06/22): Ryan [name] is a 57 y.o. male who presents for evaluation of muscle twitching and left arm heaviness. He has a relevant  medical history of HLD, DVT, hypothyroidism, asthma. His neurological examination is essentially normal, with no fasciculations seen on exam today. There are no worrisome findings on exam to suggest a more serious problem such as ALS or muscle disease. This could be electrolyte or thyroid related, or benign fasciculations. Given the arm heaviness, I will get EMG and monitor symptoms.   PLAN: -Blood work: TSH, PTH, vit D, ionized Ca -EMG: LUE -OTC recommendations for twitching given  Since their last visit: Labs were normal. EMG showed no significant abnormalities. At the time of EMG (10/15/22), patient's muscle twitching had improved since first clinic visit.   He continues to feel improvement. He rarely has twitching in legs or eyelid, but not much in his arms. He denies any weakness. He mentions that he has been noticing more soreness after working lately, like playing a round of golf or raking the leaves.   MEDICATIONS:  Outpatient Encounter Medications as of 03/06/2023  Medication Sig   cholecalciferol (VITAMIN D3) 25 MCG (1000 UNIT) tablet Take 1,000 Units by mouth daily.   ezetimibe (ZETIA) 10 MG tablet Take 1 tablet (10 mg total) by mouth daily.   levothyroxine (SYNTHROID) 25 MCG tablet Take 1 tablet (25 mcg total) by mouth daily before breakfast.   magnesium oxide (MAG-OX) 400 (240 Mg) MG tablet Take 400 mg by mouth daily.   XARELTO 10 MG TABS tablet Take 1 tablet by mouth once daily   clobetasol cream (TEMOVATE) 0.05 % Apply 1 Application topically 2 (two) times daily. Apply twice  daily to the leg for up to two weeks. Apply as needed for itch (Patient not taking: Reported on 03/06/2023)   mupirocin ointment (BACTROBAN) 2 % Apply 1 Application topically 2 (two) times daily. Apply twice daily for 7 days (Patient not taking: Reported on 03/06/2023)   nystatin-triamcinolone ointment (MYCOLOG) Apply 1 Application topically 2 (two) times daily. Apply to the face twice daily for up to one  week. Repeat as needed (Patient not taking: Reported on 03/06/2023)   No facility-administered encounter medications on file as of 03/06/2023.    PAST MEDICAL HISTORY: Past Medical History:  Diagnosis Date   Allergy    mild   Asthma    athletic induced asthma age 35    Bronchitis    with viral infection nov/Dec 2019   COVID-19    Deviated septum    DVT (deep venous thrombosis) (HCC)    Dysplastic nevus 06/19/2022   Left anterior shoulder-Severe. Excised 08/07/2022   Hyperlipidemia    Nephritis 1972    PAST SURGICAL HISTORY: Past Surgical History:  Procedure Laterality Date   APPENDECTOMY  1984    ALLERGIES: No Known Allergies  FAMILY HISTORY: Family History  Problem Relation Age of Onset   Alcohol abuse Mother    Heart attack Father    Heart disease Father    Diabetes Maternal Grandfather    Pancreatic cancer Maternal Aunt    Heart disease Paternal Grandfather    Arthritis Paternal Grandmother    Cancer Maternal Aunt    Colon cancer Neg Hx    Colon polyps Neg Hx    Esophageal cancer Neg Hx    Rectal cancer Neg Hx    Stomach cancer Neg Hx    Allergic rhinitis Neg Hx    Angioedema Neg Hx    Asthma Neg Hx    Atopy Neg Hx    Eczema Neg Hx    Immunodeficiency Neg Hx    Urticaria Neg Hx     SOCIAL HISTORY: Social History   Tobacco Use   Smoking status: Never    Passive exposure: Never   Smokeless tobacco: Never  Vaping Use   Vaping status: Never Used  Substance Use Topics   Alcohol use: Yes    Alcohol/week: 1.0 standard drink of alcohol    Types: 1 Standard drinks or equivalent per week    Comment: social   Drug use: No   Social History   Social History Narrative   Right handed   Caffeine: 1 cup/daily   1 level home w/ 10 steps living w/ spouse.   Are you right handed or left handed? Right   Are you currently employed ?    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   Who lives with you? spouse   What type of  home do you live in: 1 story or 2 story? two        Objective:  Vital Signs:  BP 100/64 (BP Location: Left Arm, Patient Position: Sitting)   Pulse 94   Ht 5\' 11"  (1.803 m)   Wt 230 lb (104.3 kg)   SpO2 96%   BMI 32.08 kg/m   General: General appearance: Awake and alert. No distress. Cooperative with exam.  Skin: No obvious rash or jaundice. HEENT: Atraumatic. Anicteric. Lungs: Non-labored breathing on room air  Extremities: No edema. No obvious deformity.  Musculoskeletal: No obvious joint swelling.  Neurological: Mental Status: Alert. Speech fluent. No pseudobulbar affect Cranial  Nerves: CNII: No RAPD. Visual fields intact. CNIII, IV, VI: PERRL. No nystagmus. EOMI. CN V: Facial sensation intact bilaterally to fine touch. [place] clench strong. CN VII: Facial muscles symmetric and strong. No ptosis at rest. CN VIII: Hears finger rub well bilaterally. CN IX: No hypophonia. CN X: Palate elevates symmetrically. CN XI: Full strength shoulder shrug bilaterally. CN XII: Tongue protrusion full and midline. No atrophy or fasciculations. No significant dysarthria Motor: Tone is normal. No fasciculations in extremities. No atrophy. Strength is 5/5 in bilateral upper and lower extremities Reflexes:  Right Left  Bicep 2+ 2+  Tricep 2+ 2+  BrRad 2+ 2+  Knee 2+ 2+  Ankle 2+ 2+   Sensation: Intact to light touch in all extremities Coordination: Intact finger-to- nose-finger bilaterally Gait: Able to rise from chair with arms crossed unassisted. Normal, narrow-based gait.   Lab and Test Review: New results: 11/09/22: HbA1c: 5.6  09/07/22: TSH wnl Vit D wnl Ionized Ca wnl PTH wnl  EMG (10/15/22): NCV & EMG Findings: Extensive electrodiagnostic evaluation of the left upper limb shows: Left median, ulnar, and radial sensory responses are within normal limits. Left median (APB) and ulnar (ADM) motor responses are within normal limits. Left ulnar (ADM) F wave latency is  within normal limits. Chronic motor axon loss changes without active denervation changes are seen in the left triceps. All other tested muscles, including cervical paraspinal muscle (C7 level) are within normal limits with normal motor unit configuration and recruitment patterns.   Impression: This study shows no significant abnormalities. Specifically: No definitive electrodiagnostic evidence of a left cervical (C5-C8) motor radiculopathy. No electrodiagnostic evidence of a left median mononeuropathy at or distal to the wrist (ie: carpal tunnel syndrome). Screen studies for a left ulnar or radial mononeuropathy are normal. Chronic neurogenic changes isolated to the left triceps muscle are seen on needle examination, with no active or ongoing changes, that is of unclear significance. This finding is too limited in degree and distribution for diagnostic purposes.  Previously reviewed results: 08/29/22: CMP wnl CBC wnl (Cr 1.27) B12: 550   TSH (05/28/22): 3.350 Lipid panel (05/28/22):  Component     Latest Ref Rng 05/28/2022  Cholesterol, Total     100 - 199 mg/dL 782   Triglycerides     0 - 149 mg/dL 956   HDL Cholesterol     >39 mg/dL 45   VLDL Cholesterol Cal     5 - 40 mg/dL 25   LDL Chol Calc (NIH)     0 - 99 mg/dL 213 (H)   Total CHOL/HDL Ratio     0.0 - 5.0 ratio 4.1     HbA1c (10/27/21): 5.6 Vit D (09/04/21) wnl  ASSESSMENT: This is Ryan [name], a 57 y.o. male with intermittent muscle twitches. Overall, symptoms have improved since first visit. His EMG showed no significant abnormalities. These twitches are most likely benign in nature. He also reports muscle soreness more than prior after activity. There is no obvious abnormalities on examination, but I will check muscle enzymes today to be sure and have a baseline.  Plan: -Blood work: CK, aldolase  Return to clinic as needed   Jacquelyne Balint, MD

## 2023-03-06 ENCOUNTER — Other Ambulatory Visit: Payer: BC Managed Care – PPO

## 2023-03-06 ENCOUNTER — Encounter: Payer: Self-pay | Admitting: Neurology

## 2023-03-06 ENCOUNTER — Ambulatory Visit: Payer: BC Managed Care – PPO | Admitting: Neurology

## 2023-03-06 VITALS — BP 100/64 | HR 94 | Ht 71.0 in | Wt 230.0 lb

## 2023-03-06 DIAGNOSIS — R253 Fasciculation: Secondary | ICD-10-CM | POA: Diagnosis not present

## 2023-03-06 DIAGNOSIS — M791 Myalgia, unspecified site: Secondary | ICD-10-CM | POA: Diagnosis not present

## 2023-03-06 NOTE — Patient Instructions (Signed)
I saw you today following up on muscle twitching. Your examination continues to look good with no evidence of anything bad causing the symptoms.  Due to your concern for muscle aches, I will check a couple of muscle enzyme tests to make sure there is no issues with your muscles.  I will be in touch when I have your results.  You can follow up with me as needed, particularly if you notice worsening symptoms or muscle weakness.  The physicians and staff at [facility] are committed to providing excellent care. You may receive a survey requesting feedback about your experience at our office. We strive to receive "very good" responses to the survey questions. If you feel that your experience would prevent you from giving the office a "very good " response, please contact our office to try to remedy the situation. We may be reached at [phone]. Thank you for taking the time out of your busy day to complete the survey.  Jacquelyne Balint, MD [facility]

## 2023-03-08 ENCOUNTER — Encounter: Payer: Self-pay | Admitting: Family Medicine

## 2023-03-08 ENCOUNTER — Ambulatory Visit: Payer: BC Managed Care – PPO | Admitting: Neurology

## 2023-03-08 LAB — CK: Total CK: 71 U/L (ref 44–196)

## 2023-03-08 LAB — ALDOLASE: Aldolase: 5 U/L (ref <=8.1)

## 2023-03-08 MED ORDER — ALBUTEROL SULFATE HFA 108 (90 BASE) MCG/ACT IN AERS: 2.0000 | INHALATION_SPRAY | RESPIRATORY_TRACT | 0 refills | Status: AC | PRN

## 2023-03-11 NOTE — Telephone Encounter (Signed)
Copied from CRM [id number]. Topic: Appointments - Scheduling Error >> Mar 11, 2023  2:20 PM Antony Haste wrote: PT would like to schedule the soonest availability for acute visit with Dr. Corky Downs, due to PCP being out of office. PT states he discussed chest cold symptoms on MyChart. Attempted to schedule PT, ran into an error in BookIt that didn't allow me to proceed with scheduling him.  Callback #: [phone]

## 2023-03-14 ENCOUNTER — Other Ambulatory Visit: Payer: Self-pay | Admitting: Hematology and Oncology

## 2023-03-14 DIAGNOSIS — I82493 Acute embolism and thrombosis of other specified deep vein of lower extremity, bilateral: Secondary | ICD-10-CM

## 2023-03-14 DIAGNOSIS — Z86718 Personal history of other venous thrombosis and embolism: Secondary | ICD-10-CM

## 2023-03-14 MED ORDER — RIVAROXABAN 10 MG PO TABS: 10.0000 mg | ORAL_TABLET | Freq: Every day | ORAL | 0 refills | Status: DC

## 2023-04-04 ENCOUNTER — Other Ambulatory Visit: Payer: Self-pay | Admitting: Hematology and Oncology

## 2023-04-04 ENCOUNTER — Inpatient Hospital Stay: Payer: 59

## 2023-04-04 ENCOUNTER — Inpatient Hospital Stay: Payer: 59 | Attending: Hematology and Oncology

## 2023-04-04 ENCOUNTER — Inpatient Hospital Stay: Payer: 59 | Admitting: Hematology and Oncology

## 2023-04-04 VITALS — BP 113/84 | HR 73 | Temp 98.2°F | Resp 16 | Ht 71.0 in | Wt 231.2 lb

## 2023-04-04 DIAGNOSIS — M791 Myalgia, unspecified site: Secondary | ICD-10-CM

## 2023-04-04 DIAGNOSIS — Z86718 Personal history of other venous thrombosis and embolism: Secondary | ICD-10-CM

## 2023-04-04 DIAGNOSIS — I82493 Acute embolism and thrombosis of other specified deep vein of lower extremity, bilateral: Secondary | ICD-10-CM

## 2023-04-04 DIAGNOSIS — Z79899 Other long term (current) drug therapy: Secondary | ICD-10-CM | POA: Diagnosis not present

## 2023-04-04 DIAGNOSIS — Z7901 Long term (current) use of anticoagulants: Secondary | ICD-10-CM | POA: Insufficient documentation

## 2023-04-04 DIAGNOSIS — Z800 Family history of malignant neoplasm of digestive organs: Secondary | ICD-10-CM | POA: Diagnosis not present

## 2023-04-04 LAB — CBC WITH DIFFERENTIAL (CANCER CENTER ONLY)
Abs Immature Granulocytes: 0.02 10*3/uL (ref 0.00–0.07)
Basophils Absolute: 0.1 10*3/uL (ref 0.0–0.1)
Basophils Relative: 1 %
Eosinophils Absolute: 0.1 10*3/uL (ref 0.0–0.5)
Eosinophils Relative: 2 %
HCT: 41.9 % (ref 39.0–52.0)
Hemoglobin: 14.5 g/dL (ref 13.0–17.0)
Immature Granulocytes: 0 %
Lymphocytes Relative: 35 %
Lymphs Abs: 2.7 10*3/uL (ref 0.7–4.0)
MCH: 31 pg (ref 26.0–34.0)
MCHC: 34.6 g/dL (ref 30.0–36.0)
MCV: 89.5 fL (ref 80.0–100.0)
Monocytes Absolute: 0.7 10*3/uL (ref 0.1–1.0)
Monocytes Relative: 9 %
Neutro Abs: 4.1 10*3/uL (ref 1.7–7.7)
Neutrophils Relative %: 53 %
Platelet Count: 227 10*3/uL (ref 150–400)
RBC: 4.68 MIL/uL (ref 4.22–5.81)
RDW: 13.1 % (ref 11.5–15.5)
WBC Count: 7.7 10*3/uL (ref 4.0–10.5)
nRBC: 0 % (ref 0.0–0.2)

## 2023-04-04 LAB — CMP (CANCER CENTER ONLY)
ALT: 29 U/L (ref 0–44)
AST: 19 U/L (ref 15–41)
Albumin: 4.2 g/dL (ref 3.5–5.0)
Alkaline Phosphatase: 48 U/L (ref 38–126)
Anion gap: 5 (ref 5–15)
BUN: 25 mg/dL — ABNORMAL HIGH (ref 6–20)
CO2: 28 mmol/L (ref 22–32)
Calcium: 9.3 mg/dL (ref 8.9–10.3)
Chloride: 107 mmol/L (ref 98–111)
Creatinine: 1.52 mg/dL — ABNORMAL HIGH (ref 0.61–1.24)
GFR, Estimated: 52 mL/min — ABNORMAL LOW (ref >60)
Glucose, Bld: 90 mg/dL (ref 70–99)
Potassium: 4.3 mmol/L (ref 3.5–5.1)
Sodium: 140 mmol/L (ref 135–145)
Total Bilirubin: 0.5 mg/dL (ref 0.0–1.2)
Total Protein: 7 g/dL (ref 6.5–8.1)

## 2023-04-04 LAB — IRON AND IRON BINDING CAPACITY (CC-WL,HP ONLY)
Iron: 98 ug/dL (ref 45–182)
Saturation Ratios: 30 % (ref 17.9–39.5)
TIBC: 328 ug/dL (ref 250–450)
UIBC: 230 ug/dL (ref 117–376)

## 2023-04-04 LAB — C-REACTIVE PROTEIN: CRP: 1 mg/dL — ABNORMAL HIGH (ref <1.0)

## 2023-04-04 LAB — SEDIMENTATION RATE: Sed Rate: 22 mm/h — ABNORMAL HIGH (ref 0–16)

## 2023-04-04 LAB — VITAMIN B12: Vitamin B-12: 445 pg/mL (ref 180–914)

## 2023-04-04 NOTE — Progress Notes (Signed)
[facility] [phone]   [phone]  PROGRESS NOTE  Patient Care Team: Melida Quitter, PA as PCP - General (Family Medicine) Jake Bathe, MD as PCP - Cardiology (Cardiology) Elizabeth Palau, FNP as Nurse Practitioner (Nurse Practitioner)  Hematological/Oncological History 1) 08/20/2020: Presented to the emergency room for left lower leg swelling x1 day. Doppler US revealed nonocclusive acute DVT involving the distal left femoral, popliteal and peroneal veins.  Patient reports recent diagnosis of COVID and DVT was felt to be provoked by this.  Patient initiated anticoagulation with Xarelto.  2) 12/15/2020: Doppler ultrasound revealed age-indeterminate DVT involving the left femoral vein, left popliteal vein and left peroneal vein.  Findings appeared essentially unchanged compared to the previous examination  3) 04/14/2021: Doppler ultrasound revealed continued age-indeterminate thrombosis involving the mid femoral vein, distal femoral vein, popliteal vein and peroneal veins.  4) 05/17/2021: Establish care with Ryan [name]  CHIEF COMPLAINTS/PURPOSE OF CONSULTATION:  Hx of left lower leg DVT  HISTORY OF PRESENTING ILLNESS:  Ryan [name] 58 y.o. male returns for left lower extremity DVT while on maintenance Xarelto therapy.  He is unaccompanied for this visit.  On exam today, Ryan [name] reports reports that he had a prolonged viral infection that lasted for about 1 months time.  He had congestion, cough, but no headaches and fever.  He reports that Delsym worked quite well with this.  He reports that he was "on quarantine for holiday".  His family did bring him food but he did not get to spend time with his family.  He reports that this time he is 100% recovered and feeling much better.  He continues taking his Xarelto 10 mg p.o. daily.  He takes it at night with food.  He has not been having any trouble with bleeding, bruising, or dark  stools.  He denies any blood in the urine or stool.  He reports the medication cost about $50 per month.  He notes he is not having any signs or symptoms concerning for recurrent VTE.  He notes that he is not having any chest pain or shortness of breath.  He notes that he has been having some issues with muscle aches and "something does not feel right".  He notes that he has good strength and is able to work out in the yard in the gym but that he feels like his muscles have a sensation he has not felt before.  He is not sure if this is part of aging.  He would like further workup for this.  He notes he is not groggy and this does not feel like his hypothyroidism.  Overall he is willing and able to continue on Xarelto therapy at this time.  He denies any fevers, chills, night sweats, shortness of breath, chest pain or cough.  He has no other complaints.  Rest of the 10 point ROS is below  MEDICAL HISTORY:  Past Medical History:  Diagnosis Date   Allergy    mild   Asthma    athletic induced asthma age 44    Bronchitis    with viral infection nov/Dec 2019   COVID-19    Deviated septum    DVT (deep venous thrombosis) (HCC)    Dysplastic nevus 06/19/2022   Left anterior shoulder-Severe. Excised 08/07/2022   Hyperlipidemia    Nephritis 1972    SURGICAL HISTORY: Past Surgical History:  Procedure Laterality Date   APPENDECTOMY  1984    SOCIAL HISTORY: Social  History   Socioeconomic History   Marital status: Married    Spouse name: Not on file   Number of children: 4   Years of education: Not on file   Highest education level: Master's degree (e.g., MA, MS, MEng, MEd, MSW, MBA)  Occupational History   Occupation: Engineering geologist: [employer]  Tobacco Use   Smoking status: Never    Passive exposure: Never   Smokeless tobacco: Never  Vaping Use   Vaping status: Never Used  Substance and Sexual Activity   Alcohol use: Yes    Alcohol/week: 1.0  standard drink of alcohol    Types: 1 Standard drinks or equivalent per week    Comment: social   Drug use: No   Sexual activity: Yes    Birth control/protection: None  Other Topics Concern   Not on file  Social History Narrative   Right handed   Caffeine: 1 cup/daily   1 level home w/ 10 steps living w/ spouse.   Are you right handed or left handed? Right   Are you currently employed ?    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   Who lives with you? spouse   What type of home do you live in: 1 story or 2 story? two       Social Drivers of Corporate investment banker Strain: Low Risk  (02/11/2023)   Overall Financial Resource Strain (CARDIA)    Difficulty of Paying Living Expenses: Not hard at all  Food Insecurity: No Food Insecurity (02/11/2023)   Hunger Vital Sign    Worried About Running Out of Food in the Last Year: Never true    Ran Out of Food in the Last Year: Never true  Transportation Needs: No Transportation Needs (02/11/2023)   PRAPARE - Administrator, Civil Service (Medical): No    Lack of Transportation (Non-Medical): No  Physical Activity: Insufficiently Active (02/11/2023)   Exercise Vital Sign    Days of Exercise per Week: 2 days    Minutes of Exercise per Session: 30 min  Stress: No Stress Concern Present (02/11/2023)   [employer] of Occupational Health - Occupational Stress Questionnaire    Feeling of Stress : Not at all  Social Connections: Unknown (02/11/2023)   Social Connection and Isolation Panel [NHANES]    Frequency of Communication with Friends and Family: Once a week    Frequency of Social Gatherings with Friends and Family: Once a week    Attends Religious Services: Patient declined    Database administrator or Organizations: Yes    Attends Engineer, structural: More than 4 times per year    Marital Status: Married  Catering manager Violence: Not on file    FAMILY  HISTORY: Family History  Problem Relation Age of Onset   Alcohol abuse Mother    Heart attack Father    Heart disease Father    Diabetes Maternal Grandfather    Pancreatic cancer Maternal Aunt    Heart disease Paternal Grandfather    Arthritis Paternal Grandmother    Cancer Maternal Aunt    Colon cancer Neg Hx    Colon polyps Neg Hx    Esophageal cancer Neg Hx    Rectal cancer Neg Hx    Stomach cancer Neg Hx    Allergic rhinitis Neg Hx    Angioedema Neg Hx    Asthma  Neg Hx    Atopy Neg Hx    Eczema Neg Hx    Immunodeficiency Neg Hx    Urticaria Neg Hx     ALLERGIES:  has no known allergies.  MEDICATIONS:  Current Outpatient Medications  Medication Sig Dispense Refill   albuterol (VENTOLIN HFA) 108 (90 Base) MCG/ACT inhaler Inhale 2 puffs into the lungs every 4 (four) hours as needed for wheezing or shortness of breath. 8 g 0   cholecalciferol (VITAMIN D3) 25 MCG (1000 UNIT) tablet Take 1,000 Units by mouth daily.     clobetasol cream (TEMOVATE) 0.05 % Apply 1 Application topically 2 (two) times daily. Apply twice daily to the leg for up to two weeks. Apply as needed for itch (Patient not taking: Reported on 03/06/2023) 30 g 0   ezetimibe (ZETIA) 10 MG tablet Take 1 tablet (10 mg total) by mouth daily. 90 tablet 1   levothyroxine (SYNTHROID) 25 MCG tablet Take 1 tablet (25 mcg total) by mouth daily before breakfast. 90 tablet 1   magnesium oxide (MAG-OX) 400 (240 Mg) MG tablet Take 400 mg by mouth daily.     mupirocin ointment (BACTROBAN) 2 % Apply 1 Application topically 2 (two) times daily. Apply twice daily for 7 days (Patient not taking: Reported on 03/06/2023) 22 g 0   nystatin-triamcinolone ointment (MYCOLOG) Apply 1 Application topically 2 (two) times daily. Apply to the face twice daily for up to one week. Repeat as needed (Patient not taking: Reported on 03/06/2023) 30 g 2   rivaroxaban (XARELTO) 10 MG TABS tablet Take 1 tablet (10 mg total) by mouth daily. 30 tablet 0    No current facility-administered medications for this visit.    REVIEW OF SYSTEMS:   Constitutional: ( - ) fevers, ( - )  chills , ( - ) night sweats Eyes: ( - ) blurriness of vision, ( - ) double vision, ( - ) watery eyes Ears, nose, mouth, throat, and face: ( - ) mucositis, ( - ) sore throat Respiratory: ( - ) cough, ( - ) dyspnea, ( - ) wheezes Cardiovascular: ( - ) palpitation, ( - ) chest discomfort, ( + ) lower extremity swelling Gastrointestinal:  ( - ) nausea, ( - ) heartburn, ( - ) change in bowel habits Skin: ( - ) abnormal skin rashes Lymphatics: ( - ) new lymphadenopathy, ( - ) easy bruising Neurological: ( - ) numbness, ( - ) tingling, ( - ) new weaknesses Behavioral/Psych: ( - ) mood change, ( - ) new changes  All other systems were reviewed with the patient and are negative.  PHYSICAL EXAMINATION: ECOG PERFORMANCE STATUS: 1 - Symptomatic but completely ambulatory  Vitals:   04/04/23 1444  BP: 113/84  Pulse: 73  Resp: 16  Temp: 98.2 F (36.8 C)  SpO2: 98%    Filed Weights   04/04/23 1444  Weight: 231 lb 3.2 oz (104.9 kg)     GENERAL: well appearing male in NAD  SKIN: skin color, texture, turgor are normal, no rashes or significant lesions EYES: conjunctiva are pink and non-injected, sclera clear OROPHARYNX: no exudate, no erythema; lips, buccal mucosa, and tongue normal  NECK: supple, non-tender LUNGS: clear to auscultation and percussion with normal breathing effort HEART: regular rate & rhythm and no murmurs. Improving left lower extremity edema without tenderness or erythema.  ABDOMEN: soft, non-tender, non-distended, normal bowel sounds Musculoskeletal: no cyanosis of digits and no clubbing  PSYCH: alert & oriented x 3, fluent speech NEURO: no  focal motor/sensory deficits  LABORATORY DATA:  I have reviewed the data as listed    Latest Ref Rng & Units 04/04/2023    2:28 PM 09/17/2022    9:33 AM 08/29/2022    9:49 AM  CBC  WBC 4.0 - 10.5 K/uL 7.7   6.9  6.2   Hemoglobin 13.0 - 17.0 g/dL 78.2  95.6  21.3   Hematocrit 39.0 - 52.0 % 41.9  44.7  44.7   Platelets 150 - 400 K/uL 227  239  283        Latest Ref Rng & Units 04/04/2023    2:28 PM 11/09/2022    8:56 AM 09/17/2022    9:33 AM  CMP  Glucose 70 - 99 mg/dL 90  86  086   BUN 6 - 20 mg/dL 25  16  18    Creatinine 0.61 - 1.24 mg/dL 5.78  4.69  6.29   Sodium 135 - 145 mmol/L 140  141  140   Potassium 3.5 - 5.1 mmol/L 4.3  4.4  4.4   Chloride 98 - 111 mmol/L 107  106  109   CO2 22 - 32 mmol/L 28  22  27    Calcium 8.9 - 10.3 mg/dL 9.3  9.2  8.7   Total Protein 6.5 - 8.1 g/dL 7.0  6.7  6.7   Total Bilirubin 0.0 - 1.2 mg/dL 0.5  0.5  0.5   Alkaline Phos 38 - 126 U/L 48  64  48   AST 15 - 41 U/L 19  18  22    ALT 0 - 44 U/L 29  27  30      RADIOGRAPHIC STUDIES: I have personally reviewed the radiological images as listed and agreed with the findings in the report. No results found.  ASSESSMENT & PLAN Ryan [name] is a 58 y.o. male returns for a follow up for history of left leg DVT.   #History of LLE DVT: --Diagnosed in June 2022 and felt to be provoked from COVID infection although infection was mild. 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 --No evidence of antiphospholipid syndrome based on labs form 05/17/2021.  PLAN: --Currently on maintenance anticoagulation therapy with Xarelto 10 mg once a day. Patient prefers to continue on maintenance therapy to prevent risk of future clots.  --No prohibitive toxicities including easy bruising or bleeding.  --Labs today show white blood cell 7.7, hemoglobin 14.5, MCV 89.5, platelets 227 --RTC in 6 months with labs  # Muscle Aches -- Will order a battery of labs to assess for muscle aches including vitamin B12,  ESR, CRP, thyroid studies, and iron studies.  Orders Placed This Encounter  Procedures   Vitamin B12    Standing Status:   Future    Number of Occurrences:   1    Expiration Date:   04/03/2024   Methylmalonic acid, serum    Standing Status:   Future    Number of Occurrences:   1    Expiration Date:   04/03/2024   Sedimentation rate    Standing Status:   Future    Number of Occurrences:   1    Expiration Date:   04/03/2024   C-reactive protein    Standing Status:   Future    Number of Occurrences:  1    Expiration Date:   04/03/2024   TSH    Standing Status:   Future    Number of Occurrences:   1    Expiration Date:   04/03/2024   T4    Standing Status:   Future    Number of Occurrences:   1    Expiration Date:   04/03/2024   Iron and Iron Binding Capacity ([facility] only)    Standing Status:   Future    Number of Occurrences:   1    Expiration Date:   04/03/2024   Ferritin    Standing Status:   Future    Number of Occurrences:   1    Expiration Date:   04/03/2024    All questions were answered. The patient knows to call the clinic with any problems, questions or concerns.  I have spent a total of 25 minutes minutes of face-to-face and non-face-to-face time, preparing to see the patient, performing a medically appropriate examination, counseling and educating the patient, ordering medications/tests, documenting clinical information in the electronic health record, and care coordination.   Ryan Barns, MD Department of [name] Phone: [phone] Pager: [phone] Email: [email or web address]

## 2023-04-05 LAB — T4: T4, Total: 6.6 ug/dL (ref 4.5–12.0)

## 2023-04-05 LAB — TSH: TSH: 3.662 u[IU]/mL (ref 0.350–4.500)

## 2023-04-05 LAB — FERRITIN: Ferritin: 142 ng/mL (ref 24–336)

## 2023-04-07 LAB — METHYLMALONIC ACID, SERUM: Methylmalonic Acid, Quantitative: 156 nmol/L (ref 0–378)

## 2023-04-25 ENCOUNTER — Encounter: Payer: Self-pay | Admitting: Family Medicine

## 2023-05-13 ENCOUNTER — Other Ambulatory Visit: Payer: Self-pay | Admitting: Nurse Practitioner

## 2023-05-13 DIAGNOSIS — Z86718 Personal history of other venous thrombosis and embolism: Secondary | ICD-10-CM

## 2023-05-13 DIAGNOSIS — I82493 Acute embolism and thrombosis of other specified deep vein of lower extremity, bilateral: Secondary | ICD-10-CM

## 2023-06-14 ENCOUNTER — Other Ambulatory Visit: Payer: Self-pay | Admitting: Hematology and Oncology

## 2023-06-14 DIAGNOSIS — I82493 Acute embolism and thrombosis of other specified deep vein of lower extremity, bilateral: Secondary | ICD-10-CM

## 2023-06-14 DIAGNOSIS — Z86718 Personal history of other venous thrombosis and embolism: Secondary | ICD-10-CM

## 2023-08-05 ENCOUNTER — Other Ambulatory Visit: Payer: Self-pay | Admitting: *Deleted

## 2023-08-05 DIAGNOSIS — E559 Vitamin D deficiency, unspecified: Secondary | ICD-10-CM

## 2023-08-05 DIAGNOSIS — E782 Mixed hyperlipidemia: Secondary | ICD-10-CM

## 2023-08-05 DIAGNOSIS — R7309 Other abnormal glucose: Secondary | ICD-10-CM

## 2023-08-05 DIAGNOSIS — E038 Other specified hypothyroidism: Secondary | ICD-10-CM

## 2023-08-06 ENCOUNTER — Other Ambulatory Visit: Payer: BC Managed Care – PPO

## 2023-08-06 DIAGNOSIS — R7309 Other abnormal glucose: Secondary | ICD-10-CM

## 2023-08-06 DIAGNOSIS — E559 Vitamin D deficiency, unspecified: Secondary | ICD-10-CM

## 2023-08-06 DIAGNOSIS — E782 Mixed hyperlipidemia: Secondary | ICD-10-CM

## 2023-08-06 DIAGNOSIS — E038 Other specified hypothyroidism: Secondary | ICD-10-CM

## 2023-08-07 ENCOUNTER — Ambulatory Visit: Payer: Self-pay | Admitting: Family Medicine

## 2023-08-07 LAB — COMPREHENSIVE METABOLIC PANEL WITH GFR
ALT: 22 IU/L (ref 0–44)
AST: 19 IU/L (ref 0–40)
Albumin: 4.4 g/dL (ref 3.8–4.9)
Alkaline Phosphatase: 61 IU/L (ref 44–121)
BUN/Creatinine Ratio: 17 (ref 9–20)
BUN: 21 mg/dL (ref 6–24)
Bilirubin Total: 0.5 mg/dL (ref 0.0–1.2)
CO2: 19 mmol/L — ABNORMAL LOW (ref 20–29)
Calcium: 9.2 mg/dL (ref 8.7–10.2)
Chloride: 104 mmol/L (ref 96–106)
Creatinine, Ser: 1.23 mg/dL (ref 0.76–1.27)
Globulin, Total: 2.4 g/dL (ref 1.5–4.5)
Glucose: 92 mg/dL (ref 70–99)
Potassium: 4.6 mmol/L (ref 3.5–5.2)
Sodium: 138 mmol/L (ref 134–144)
Total Protein: 6.8 g/dL (ref 6.0–8.5)
eGFR: 68 mL/min/{1.73_m2} (ref >59)

## 2023-08-07 LAB — CBC WITH DIFFERENTIAL/PLATELET
Basophils Absolute: 0.1 10*3/uL (ref 0.0–0.2)
Basos: 1 %
EOS (ABSOLUTE): 0.2 10*3/uL (ref 0.0–0.4)
Eos: 2 %
Hematocrit: 45.5 % (ref 37.5–51.0)
Hemoglobin: 15.3 g/dL (ref 13.0–17.7)
Immature Grans (Abs): 0 10*3/uL (ref 0.0–0.1)
Immature Granulocytes: 0 %
Lymphocytes Absolute: 2.8 10*3/uL (ref 0.7–3.1)
Lymphs: 40 %
MCH: 31 pg (ref 26.6–33.0)
MCHC: 33.6 g/dL (ref 31.5–35.7)
MCV: 92 fL (ref 79–97)
Monocytes Absolute: 0.6 10*3/uL (ref 0.1–0.9)
Monocytes: 8 %
Neutrophils Absolute: 3.5 10*3/uL (ref 1.4–7.0)
Neutrophils: 49 %
Platelets: 285 10*3/uL (ref 150–450)
RBC: 4.94 x10E6/uL (ref 4.14–5.80)
RDW: 12.4 % (ref 11.6–15.4)
WBC: 7 10*3/uL (ref 3.4–10.8)

## 2023-08-07 LAB — LIPID PANEL
Chol/HDL Ratio: 4.2 ratio (ref 0.0–5.0)
Cholesterol, Total: 202 mg/dL — ABNORMAL HIGH (ref 100–199)
HDL: 48 mg/dL (ref >39)
LDL Chol Calc (NIH): 133 mg/dL — ABNORMAL HIGH (ref 0–99)
Triglycerides: 114 mg/dL (ref 0–149)
VLDL Cholesterol Cal: 21 mg/dL (ref 5–40)

## 2023-08-07 LAB — TSH: TSH: 5.65 u[IU]/mL — ABNORMAL HIGH (ref 0.450–4.500)

## 2023-08-07 LAB — VITAMIN D 25 HYDROXY (VIT D DEFICIENCY, FRACTURES): Vit D, 25-Hydroxy: 55.2 ng/mL (ref 30.0–100.0)

## 2023-08-07 LAB — HEMOGLOBIN A1C
Est. average glucose Bld gHb Est-mCnc: 117 mg/dL
Hgb A1c MFr Bld: 5.7 % — ABNORMAL HIGH (ref 4.8–5.6)

## 2023-08-09 ENCOUNTER — Other Ambulatory Visit: Payer: Self-pay | Admitting: Physician Assistant

## 2023-08-09 DIAGNOSIS — Z86718 Personal history of other venous thrombosis and embolism: Secondary | ICD-10-CM

## 2023-08-09 DIAGNOSIS — I82493 Acute embolism and thrombosis of other specified deep vein of lower extremity, bilateral: Secondary | ICD-10-CM

## 2023-08-13 ENCOUNTER — Ambulatory Visit: Payer: BC Managed Care – PPO | Admitting: Family Medicine

## 2023-08-27 NOTE — Progress Notes (Unsigned)
   Established Patient Office Visit  Subjective   Patient ID: Ryan [name], male    DOB: [birth year]  Age: 58 y.o. MRN: [id number]  No chief complaint on file.   HPI  Ryan [name] is a 58 year old male who presents to the clinic for follow-up on elevated TSH and hyperlipidemia.   {History (Optional):23778}  ROS Per HPI.    Objective:     There were no vitals taken for this visit. {Vitals History (Optional):23777}  Physical Exam   No results found for any visits on 08/28/23.  {Labs (Optional):23779}  The 10-year ASCVD risk score (Arnett DK, et al., 2019) is: 6.7%    Assessment & Plan:   There are no diagnoses linked to this encounter.  Assessment and Plan              No follow-ups on file.    Odilia Bennett, PA-C

## 2023-08-28 ENCOUNTER — Ambulatory Visit

## 2023-08-28 VITALS — BP 106/73 | HR 79 | Ht 71.0 in | Wt 238.0 lb

## 2023-08-28 DIAGNOSIS — D229 Melanocytic nevi, unspecified: Secondary | ICD-10-CM | POA: Diagnosis not present

## 2023-08-28 DIAGNOSIS — L918 Other hypertrophic disorders of the skin: Secondary | ICD-10-CM | POA: Diagnosis not present

## 2023-08-28 DIAGNOSIS — R5383 Other fatigue: Secondary | ICD-10-CM | POA: Diagnosis not present

## 2023-08-28 DIAGNOSIS — E038 Other specified hypothyroidism: Secondary | ICD-10-CM

## 2023-08-28 DIAGNOSIS — E782 Mixed hyperlipidemia: Secondary | ICD-10-CM

## 2023-08-28 MED ORDER — LEVOTHYROXINE SODIUM 50 MCG PO TABS: 50.0000 ug | ORAL_TABLET | Freq: Every day | ORAL | 3 refills | Status: AC

## 2023-08-28 NOTE — Assessment & Plan Note (Signed)
 Discussed potential etiologies of fatigue including elevated TSH/hypothyroidism versus sleep apnea.  Synthroid  dose increased from 25 mcg to 50 mcg daily.  Will reassess TSH and fatigue symptoms in 6 to 8 weeks.  Home sleep test order placed today to evaluate for sleep apnea.  Will follow-up with results when they arrive.

## 2023-08-28 NOTE — Assessment & Plan Note (Signed)
 Last TSH 5.6.  Given that patient is symptomatic with increased fatigue and bodyaches, will increase Synthroid  from 25 mcg to 50 mcg daily.  Will recheck TSH in 6 to 8 weeks.

## 2023-08-28 NOTE — Patient Instructions (Addendum)
 It was nice to see you today!  As we discussed in clinic:   -I have increased your Synthroid  dose form 25 mcg daily to 50 mcg daily to address your elevated TSH and help with the associated symptoms of fatigue and body aches. -I will plan to recheck your thyroid  levels in 8 weeks to see if they improved.  -I have also placed a referral for a home sleep study to evaluate you for sleep apnea. -I have also placed a referral to dermatology for your skin concerns. -I recommend wearing compression socks daily to help with the swelling in your legs and calling your vascular surgeon to schedule a follow up appointment. Their phone number is [phone]. -As long as the muscle aches have resolved by your next appointment with me, we can discuss starting a new statin medication for your cholesterol.  If you have any problems before your next visit feel free to message me via MyChart (minor issues or questions) or call the office, otherwise you may reach out to schedule an office visit.  Thank you! Meryl Acosta, PA-C

## 2023-08-28 NOTE — Assessment & Plan Note (Signed)
 Last lipid panel: LDL 133, HDL 48, triglycerides 114.  Lipids have increased from his last check.  Continue Zetia  10 mg daily and co-Q10 supplement for now.  At his follow-up on thyroid  in 8 weeks, patient would like to discuss initiating a statin different from rosuvastatin .  Will consider initiating atorvastatin or pravastatin at his follow-up.

## 2023-09-12 ENCOUNTER — Other Ambulatory Visit: Payer: Self-pay | Admitting: Family Medicine

## 2023-09-12 DIAGNOSIS — Z789 Other specified health status: Secondary | ICD-10-CM

## 2023-09-12 DIAGNOSIS — E782 Mixed hyperlipidemia: Secondary | ICD-10-CM

## 2023-10-03 ENCOUNTER — Inpatient Hospital Stay: Payer: 59 | Attending: Hematology and Oncology

## 2023-10-03 ENCOUNTER — Other Ambulatory Visit: Payer: Self-pay | Admitting: Hematology and Oncology

## 2023-10-03 ENCOUNTER — Inpatient Hospital Stay: Payer: 59 | Admitting: Hematology and Oncology

## 2023-10-03 VITALS — BP 119/85 | HR 89 | Temp 97.9°F | Resp 18 | Wt 236.1 lb

## 2023-10-03 DIAGNOSIS — Z86718 Personal history of other venous thrombosis and embolism: Secondary | ICD-10-CM

## 2023-10-03 DIAGNOSIS — M791 Myalgia, unspecified site: Secondary | ICD-10-CM | POA: Insufficient documentation

## 2023-10-03 DIAGNOSIS — Z7901 Long term (current) use of anticoagulants: Secondary | ICD-10-CM | POA: Insufficient documentation

## 2023-10-03 DIAGNOSIS — Z800 Family history of malignant neoplasm of digestive organs: Secondary | ICD-10-CM | POA: Insufficient documentation

## 2023-10-03 DIAGNOSIS — I82493 Acute embolism and thrombosis of other specified deep vein of lower extremity, bilateral: Secondary | ICD-10-CM | POA: Diagnosis not present

## 2023-10-03 LAB — CBC WITH DIFFERENTIAL (CANCER CENTER ONLY)
Abs Immature Granulocytes: 0.04 K/uL (ref 0.00–0.07)
Basophils Absolute: 0 K/uL (ref 0.0–0.1)
Basophils Relative: 0 %
Eosinophils Absolute: 0 K/uL (ref 0.0–0.5)
Eosinophils Relative: 0 %
HCT: 41.3 % (ref 39.0–52.0)
Hemoglobin: 14.4 g/dL (ref 13.0–17.0)
Immature Granulocytes: 0 %
Lymphocytes Relative: 14 %
Lymphs Abs: 1.3 K/uL (ref 0.7–4.0)
MCH: 31 pg (ref 26.0–34.0)
MCHC: 34.9 g/dL (ref 30.0–36.0)
MCV: 89 fL (ref 80.0–100.0)
Monocytes Absolute: 0.6 K/uL (ref 0.1–1.0)
Monocytes Relative: 6 %
Neutro Abs: 7.3 K/uL (ref 1.7–7.7)
Neutrophils Relative %: 80 %
Platelet Count: 223 K/uL (ref 150–400)
RBC: 4.64 MIL/uL (ref 4.22–5.81)
RDW: 12.9 % (ref 11.5–15.5)
WBC Count: 9.2 K/uL (ref 4.0–10.5)
nRBC: 0 % (ref 0.0–0.2)

## 2023-10-03 LAB — CMP (CANCER CENTER ONLY)
ALT: 26 U/L (ref 0–44)
AST: 19 U/L (ref 15–41)
Albumin: 4.3 g/dL (ref 3.5–5.0)
Alkaline Phosphatase: 49 U/L (ref 38–126)
Anion gap: 7 (ref 5–15)
BUN: 22 mg/dL — ABNORMAL HIGH (ref 6–20)
CO2: 25 mmol/L (ref 22–32)
Calcium: 9.2 mg/dL (ref 8.9–10.3)
Chloride: 106 mmol/L (ref 98–111)
Creatinine: 1.45 mg/dL — ABNORMAL HIGH (ref 0.61–1.24)
GFR, Estimated: 56 mL/min — ABNORMAL LOW (ref >60)
Glucose, Bld: 79 mg/dL (ref 70–99)
Potassium: 4 mmol/L (ref 3.5–5.1)
Sodium: 138 mmol/L (ref 135–145)
Total Bilirubin: 0.6 mg/dL (ref 0.0–1.2)
Total Protein: 7.2 g/dL (ref 6.5–8.1)

## 2023-10-03 NOTE — Progress Notes (Unsigned)
 [facility] [phone]   [phone]  PROGRESS NOTE  Patient Care Team: Gayle Saddie JULIANNA DEVONNA as PCP - General (Physician Assistant) Jeffrie Oneil BROCKS, MD as PCP - Cardiology (Cardiology) Lenon Boyer, FNP as Nurse Practitioner (Nurse Practitioner)  Hematological/Oncological History 1) 08/20/2020: Presented to the emergency room for left lower leg swelling x1 day. Doppler US  revealed nonocclusive acute DVT involving the distal left femoral, popliteal and peroneal veins.  Patient reports recent diagnosis of COVID and DVT was felt to be provoked by this.  Patient initiated anticoagulation with Xarelto .  2) 12/15/2020: Doppler ultrasound revealed age-indeterminate DVT involving the left femoral vein, left popliteal vein and left peroneal vein.  Findings appeared essentially unchanged compared to the previous examination  3) 04/14/2021: Doppler ultrasound revealed continued age-indeterminate thrombosis involving the mid femoral vein, distal femoral vein, popliteal vein and peroneal veins.  4) 05/17/2021: Establish care with [facility] Hematology/Oncology  CHIEF COMPLAINTS/PURPOSE OF CONSULTATION:  Hx of left lower leg DVT  HISTORY OF PRESENTING ILLNESS:  Ryan [name] 58 y.o. male returns for left lower extremity DVT while on maintenance Xarelto  therapy.  He is unaccompanied for this visit.  On exam today, Ryan [name] reports he has been well overall since our last visit.  He reports that he does not plan to travel the summer as he did travel in the spring.  He reports he has had no changes in his health in the interim since her last visit.  He had no hospitalizations or ER visits but did have his levothyroxine  dose increased from 25 mg to 50 mg.  He reports he continues to have difficulties with muscle aches although he changed his cholesterol medication.  He reports that he is tolerating his Xarelto  well with no bleeding, bruising, or dark stools.  He reports the  medication is costing him $45 per month.  He notes he does not have any signs or symptoms concerning for recurrent VTE.  He notes he also does not have any upcoming dental work but did have a root canal this morning where he did not hold his blood thinner.  He has not had any bleeding since that time.  Overall he feels well and is willing and able to proceed with Xarelto  at this time.  He denies any fevers, chills, sweats, nausea, vomiting or diarrhea.  A full 10 point ROS is otherwise negative.  MEDICAL HISTORY:  Past Medical History:  Diagnosis Date   Allergy    mild   Asthma    athletic induced asthma age 38    Bronchitis    with viral infection nov/Dec 2019   COVID-19    Deviated septum    DVT (deep venous thrombosis) (HCC)    Dysplastic nevus 06/19/2022   Left anterior shoulder-Severe. Excised 08/07/2022   Hyperlipidemia    Nephritis 1972    SURGICAL HISTORY: Past Surgical History:  Procedure Laterality Date   APPENDECTOMY  1984    SOCIAL HISTORY: Social History   Socioeconomic History   Marital status: Married    Spouse name: Not on file   Number of children: 4   Years of education: Not on file   Highest education level: Master's degree (e.g., MA, MS, MEng, MEd, MSW, MBA)  Occupational History   Occupation: Engineering geologist: [employer]  Tobacco Use   Smoking status: Never    Passive exposure: Never   Smokeless tobacco: Never  Vaping Use   Vaping status: Never  Used  Substance and Sexual Activity   Alcohol use: Yes    Alcohol/week: 1.0 standard drink of alcohol    Types: 1 Standard drinks or equivalent per week    Comment: social   Drug use: No   Sexual activity: Yes    Birth control/protection: None  Other Topics Concern   Not on file  Social History Narrative   Right handed   Caffeine: 1 cup/daily   1 level home w/ 10 steps living w/ spouse.   Are you right handed or left handed? Right   Are you currently employed ?     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   Who lives with you? spouse   What type of home do you live in: 1 story or 2 story? two       Social Drivers of Corporate investment banker Strain: Low Risk  (08/28/2023)   Overall Financial Resource Strain (CARDIA)    Difficulty of Paying Living Expenses: Not hard at all  Food Insecurity: No Food Insecurity (08/28/2023)   Hunger Vital Sign    Worried About Running Out of Food in the Last Year: Never true    Ran Out of Food in the Last Year: Never true  Transportation Needs: No Transportation Needs (08/28/2023)   PRAPARE - Administrator, Civil Service (Medical): No    Lack of Transportation (Non-Medical): No  Physical Activity: Insufficiently Active (08/28/2023)   Exercise Vital Sign    Days of Exercise per Week: 1 day    Minutes of Exercise per Session: 90 min  Stress: No Stress Concern Present (08/28/2023)   [employer] of Occupational Health - Occupational Stress Questionnaire    Feeling of Stress : Only a little  Social Connections: Moderately Isolated (08/28/2023)   Social Connection and Isolation Panel    Frequency of Communication with Friends and Family: Once a week    Frequency of Social Gatherings with Friends and Family: Once a week    Attends Religious Services: Never    Database administrator or Organizations: Yes    Attends Engineer, structural: More than 4 times per year    Marital Status: Married  Catering manager Violence: Not on file    FAMILY HISTORY: Family History  Problem Relation Age of Onset   Alcohol abuse Mother    Heart attack Father    Heart disease Father    Diabetes Maternal Grandfather    Pancreatic cancer Maternal Aunt    Heart disease Paternal Grandfather    Arthritis Paternal Grandmother    Cancer Maternal Aunt    Colon cancer Neg Hx    Colon polyps Neg Hx    Esophageal cancer Neg Hx    Rectal cancer Neg Hx    Stomach cancer Neg Hx     Allergic rhinitis Neg Hx    Angioedema Neg Hx    Asthma Neg Hx    Atopy Neg Hx    Eczema Neg Hx    Immunodeficiency Neg Hx    Urticaria Neg Hx     ALLERGIES:  has no known allergies.  MEDICATIONS:  Current Outpatient Medications  Medication Sig Dispense Refill   albuterol  (VENTOLIN  HFA) 108 (90 Base) MCG/ACT inhaler Inhale 2 puffs into the lungs every 4 (four) hours as needed for wheezing or shortness of breath. 8 g 0   cholecalciferol (VITAMIN D3) 25 MCG (1000 UNIT) tablet Take 1,000 Units  by mouth daily.     ezetimibe  (ZETIA ) 10 MG tablet TAKE 1 TABLET BY MOUTH DAILY. 90 tablet 1   levothyroxine  (SYNTHROID ) 50 MCG tablet Take 1 tablet (50 mcg total) by mouth daily. 90 tablet 3   magnesium oxide (MAG-OX) 400 (240 Mg) MG tablet Take 400 mg by mouth daily.     XARELTO  10 MG TABS tablet TAKE 1 TABLET BY MOUTH ONCE DAILY 30 tablet 1   No current facility-administered medications for this visit.    REVIEW OF SYSTEMS:   Constitutional: ( - ) fevers, ( - )  chills , ( - ) night sweats Eyes: ( - ) blurriness of vision, ( - ) double vision, ( - ) watery eyes Ears, nose, mouth, throat, and face: ( - ) mucositis, ( - ) sore throat Respiratory: ( - ) cough, ( - ) dyspnea, ( - ) wheezes Cardiovascular: ( - ) palpitation, ( - ) chest discomfort, ( + ) lower extremity swelling Gastrointestinal:  ( - ) nausea, ( - ) heartburn, ( - ) change in bowel habits Skin: ( - ) abnormal skin rashes Lymphatics: ( - ) new lymphadenopathy, ( - ) easy bruising Neurological: ( - ) numbness, ( - ) tingling, ( - ) new weaknesses Behavioral/Psych: ( - ) mood change, ( - ) new changes  All other systems were reviewed with the patient and are negative.  PHYSICAL EXAMINATION: ECOG PERFORMANCE STATUS: 1 - Symptomatic but completely ambulatory  Vitals:   10/03/23 1459  BP: 119/85  Pulse: 89  Resp: 18  Temp: 97.9 F (36.6 C)  SpO2: 97%     Filed Weights   10/03/23 1459  Weight: 236 lb 1.6 oz (107.1  kg)      GENERAL: well appearing male in NAD  SKIN: skin color, texture, turgor are normal, no rashes or significant lesions EYES: conjunctiva are pink and non-injected, sclera clear OROPHARYNX: no exudate, no erythema; lips, buccal mucosa, and tongue normal  NECK: supple, non-tender LUNGS: clear to auscultation and percussion with normal breathing effort HEART: regular rate & rhythm and no murmurs. Improving left lower extremity edema without tenderness or erythema.  ABDOMEN: soft, non-tender, non-distended, normal bowel sounds Musculoskeletal: no cyanosis of digits and no clubbing  PSYCH: alert & oriented x 3, fluent speech NEURO: no focal motor/sensory deficits  LABORATORY DATA:  I have reviewed the data as listed    Latest Ref Rng & Units 10/03/2023    2:29 PM 08/06/2023    8:22 AM 04/04/2023    2:28 PM  CBC  WBC 4.0 - 10.5 K/uL 9.2  7.0  7.7   Hemoglobin 13.0 - 17.0 g/dL 85.5  84.6  85.4   Hematocrit 39.0 - 52.0 % 41.3  45.5  41.9   Platelets 150 - 400 K/uL 223  285  227        Latest Ref Rng & Units 10/03/2023    2:29 PM 08/06/2023    8:22 AM 04/04/2023    2:28 PM  CMP  Glucose 70 - 99 mg/dL 79  92  90   BUN 6 - 20 mg/dL 22  21  25    Creatinine 0.61 - 1.24 mg/dL 8.54  8.76  8.47   Sodium 135 - 145 mmol/L 138  138  140   Potassium 3.5 - 5.1 mmol/L 4.0  4.6  4.3   Chloride 98 - 111 mmol/L 106  104  107   CO2 22 - 32 mmol/L 25  19  28   Calcium  8.9 - 10.3 mg/dL 9.2  9.2  9.3   Total Protein 6.5 - 8.1 g/dL 7.2  6.8  7.0   Total Bilirubin 0.0 - 1.2 mg/dL 0.6  0.5  0.5   Alkaline Phos 38 - 126 U/L 49  61  48   AST 15 - 41 U/L 19  19  19    ALT 0 - 44 U/L 26  22  29      RADIOGRAPHIC STUDIES: I have personally reviewed the radiological images as listed and agreed with the findings in the report. No results found.  ASSESSMENT & PLAN Ryan [name] is a 58 y.o. male returns for a follow up for history of left leg DVT.   #History of LLE DVT: --Diagnosed in June 2022 and  felt to be provoked from COVID infection although infection was mild. He was  not sedentary nor particularly ill from the infection --Most recent doppler US  from 10/13/2021 showed chronic deep vein thrombosis involving the left femoral vein, and left popliteal vein.There appears to be a venous/arterial AVF involving the popliteal vessels. --No evidence of antiphospholipid syndrome based on labs form 05/17/2021.  PLAN: --Currently on maintenance anticoagulation therapy with Xarelto  10 mg once a day. Patient prefers to continue on maintenance therapy to prevent risk of future clots.  --No prohibitive toxicities including easy bruising or bleeding.  --Labs today show white blood cell 9.2, hemoglobin 14.4, MCV 89, platelets 223 --RTC in 6 months with labs  # Muscle Aches -- etiology unclear   No orders of the defined types were placed in this encounter.   All questions were answered. The patient knows to call the clinic with any problems, questions or concerns.  I have spent a total of 25 minutes minutes of face-to-face and non-face-to-face time, preparing to see the patient, performing a medically appropriate examination, counseling and educating the patient, ordering medications/tests, documenting clinical information in the electronic health record, and care coordination.   Norleen IVAR Kidney, MD Department of Hematology/Oncology [facility] Phone: [phone] Pager: [phone] Email: [email or web address]

## 2023-10-11 ENCOUNTER — Other Ambulatory Visit: Payer: Self-pay

## 2023-10-11 DIAGNOSIS — I82493 Acute embolism and thrombosis of other specified deep vein of lower extremity, bilateral: Secondary | ICD-10-CM

## 2023-10-11 DIAGNOSIS — Z86718 Personal history of other venous thrombosis and embolism: Secondary | ICD-10-CM

## 2023-10-11 NOTE — Telephone Encounter (Unsigned)
 Copied from CRM [id number]. Topic: Clinical - Medication Refill >> Oct 11, 2023  3:45 PM Shardie S wrote: Medication:  XARELTO  10 MG TABS tablet, requesting multiple refills   Has the patient contacted their pharmacy? Yes (Agent: If no, request that the patient contact the pharmacy for the refill. If patient does not wish to contact the pharmacy document the reason why and proceed with request.) (Agent: If yes, when and what did the pharmacy advise?)  This is the patient's preferred pharmacy:   [facility] - [place], [place] - [address] [place] [address] Phone: [phone] Fax: [phone]  Is this the correct pharmacy for this prescription? Yes If no, delete pharmacy and type the correct one.   Has the prescription been filled recently? No  Is the patient out of the medication? Yes  Has the patient been seen for an appointment in the last year OR does the patient have an upcoming appointment? Yes  Can we respond through MyChart? No  Agent: Please be advised that Rx refills may take up to 3 business days. We ask that you follow-up with your pharmacy.

## 2023-10-14 MED ORDER — RIVAROXABAN 10 MG PO TABS: 10.0000 mg | ORAL_TABLET | Freq: Every day | ORAL | 1 refills | Status: DC

## 2023-10-21 ENCOUNTER — Other Ambulatory Visit: Payer: Self-pay

## 2023-10-21 DIAGNOSIS — E038 Other specified hypothyroidism: Secondary | ICD-10-CM

## 2023-10-22 ENCOUNTER — Other Ambulatory Visit

## 2023-10-22 DIAGNOSIS — E038 Other specified hypothyroidism: Secondary | ICD-10-CM

## 2023-10-23 ENCOUNTER — Ambulatory Visit: Payer: Self-pay

## 2023-10-23 LAB — TSH+FREE T4
Free T4: 1.15 ng/dL (ref 0.82–1.77)
TSH: 2.12 u[IU]/mL (ref 0.450–4.500)

## 2023-10-28 ENCOUNTER — Ambulatory Visit

## 2023-10-28 VITALS — BP 99/65 | HR 71 | Ht 71.0 in | Wt 236.1 lb

## 2023-10-28 DIAGNOSIS — I82493 Acute embolism and thrombosis of other specified deep vein of lower extremity, bilateral: Secondary | ICD-10-CM

## 2023-10-28 DIAGNOSIS — R5382 Chronic fatigue, unspecified: Secondary | ICD-10-CM

## 2023-10-28 DIAGNOSIS — E782 Mixed hyperlipidemia: Secondary | ICD-10-CM

## 2023-10-28 DIAGNOSIS — E038 Other specified hypothyroidism: Secondary | ICD-10-CM

## 2023-10-28 DIAGNOSIS — Z86718 Personal history of other venous thrombosis and embolism: Secondary | ICD-10-CM

## 2023-10-28 MED ORDER — RIVAROXABAN 10 MG PO TABS: 10.0000 mg | ORAL_TABLET | Freq: Every day | ORAL | 2 refills | Status: DC

## 2023-10-28 NOTE — Assessment & Plan Note (Signed)
 Chronic fatigue and body aches possibly related to medication. Improved sleep with reduced work stress, but body aches persist. Previous magnesium supplementation worsened symptoms. - Trial off Zetia  for 7 days to assess for improvement in body aches. - Monitor for changes in fatigue and body aches during the trial off Zetia .

## 2023-10-28 NOTE — Assessment & Plan Note (Signed)
 Last lipid panel: LDL 133, HDL 48, Trig 144. The 10-year ASCVD risk score (Arnett DK, et al., 2019) is: 5.3% Discussed 7-day trial off of Zetia  10 mg to see if muscle/body aches improve. Patient is still very open to trying other statin medications to reduce his risk for cardiac events. Consider trial of atorvastatin or pravastatin. If these medications also cause muscle cramps, consider referral to lipid clinic for Repatha.

## 2023-10-28 NOTE — Progress Notes (Signed)
 Established Patient Office Visit  Subjective   Patient ID: Ryan [name], male    DOB: 1965-06-16  Age: 58 y.o. MRN: [id number]  Chief Complaint  Patient presents with   Medical Management of Chronic Issues    HPI  History of Present Illness Ryan [name] is a 58 year old male with a history of blood clots who presents for a follow-up on thyroid  labs and medication management.  Anticoagulation management and thromboembolic disease - Currently taking Xarelto  prescribed by hematologist for prior blood clots - Experiencing issues with Xarelto  prescription due to communication problems between pharmacy and hematologist in [place], potentially leading to a lapse in medication - Obtained a starter pack of Xarelto  but concerned about missing doses - Follow-up with hematologist scheduled for January or February - Uncertain about duration of Xarelto  therapy - Prefers 90 day supply of Xarelto  for cost effectiveness   Myalgias and muscle cramps - Persistent generalized body pain, muscle twitches, and cramps - No significant improvement in body aches after switching from statin to Zetia  (ezetimibe ) - Muscle aches and pains initially prompted switch from statin to Zetia  - Symptoms associated with sleep disturbances, often waking up in pain after about five hours of sleep - Magnesium supplementation discontinued by patient due to worsening of symptoms  Dyslipidemia and cardiovascular risk - History of elevated cholesterol - Cholesterol levels increased after discontinuing rosuvastatin  - Currently taking Zetia  (ezetimibe ) for cholesterol management - Concerned about impact of medication on heart health, especially with family history of heart disease - Patient reports that he would rather deal with the body aches than increase his risk of heart attack or stroke so he is open to trying another statin medication in the future or Repatha.   Thyroid  disease - Currently taking Synthroid  50  mcg for thyroid  management - Thyroid  function reportedly under control       ROS Per HPI.    Objective:     BP 99/65   Pulse 71   Ht 5' 11 (1.803 m)   Wt 236 lb 1.3 oz (107.1 kg)   SpO2 97%   BMI 32.93 kg/m    Physical Exam Constitutional:      General: He is not in acute distress.    Appearance: Normal appearance.  Cardiovascular:     Rate and Rhythm: Normal rate and regular rhythm.     Heart sounds: Normal heart sounds. No murmur heard.    No friction rub. No gallop.  Pulmonary:     Effort: Pulmonary effort is normal. No respiratory distress.     Breath sounds: Normal breath sounds.  Musculoskeletal:        General: No swelling.  Skin:    General: Skin is warm and dry.  Neurological:     General: No focal deficit present.     Mental Status: He is alert.  Psychiatric:        Mood and Affect: Mood normal.        Behavior: Behavior normal.        Thought Content: Thought content normal.     No results found for any visits on 10/28/23.  Last thyroid  functions Lab Results  Component Value Date   TSH 2.120 10/22/2023   T3TOTAL 101 05/27/2020   T4TOTAL 6.6 04/04/2023      The 10-year ASCVD risk score (Arnett DK, et al., 2019) is: 5.3%    Assessment & Plan:   History of DVT (deep vein thrombosis) -     Rivaroxaban ;  Take 1 tablet (10 mg total) by mouth daily.  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  Dispense: 90 tablet; Refill: 2    Return in about 5 months (around 03/29/2024) for HLD, body aches.    Saddie JULIANNA Sacks, PA-C

## 2023-10-28 NOTE — Patient Instructions (Signed)
 VISIT SUMMARY: You came in for a follow-up on your thyroid  labs and medication management. We discussed your current medications, including Xarelto  for blood clots, Zetia  for cholesterol, and Synthroid  for thyroid  management. We also addressed your ongoing muscle pain, sleep disturbances, and occupational stress.  YOUR PLAN: ANTICOAGULATION MANAGEMENT AND THROMBOEMBOLIC DISEASE: You are currently taking Xarelto  for previous blood clots but have experienced issues with your prescription. You have a follow-up with your hematologist scheduled. -Continue taking Xarelto  as prescribed. Ensure you do not miss any doses. -Follow up with your hematologist in January or February to discuss the duration of your Xarelto  therapy. -I have sent in 90 day supply to your pharmacy  MYALGIAS AND MUSCLE CRAMPS: You have persistent body pain, muscle twitches, and cramps, which may be related to your medication. -Discontinue Zetia  for 7 days to see if your body aches improve. -Monitor your symptoms during this period and report any changes. Please keep me updated on MyChart   DYSLIPIDEMIA AND CARDIOVASCULAR RISK: You have a history of high cholesterol and are currently taking Zetia . Your cholesterol levels increased after stopping rosuvastatin . -Discontinue Zetia  for 7 days to assess its impact on your muscle pain. -Consider alternative statin therapy if your LDL levels remain high after the trial. -Discuss the potential use of Repatha if you continue to have issues with statins.  THYROID  DISEASE: Your thyroid  function is well-managed with Synthroid . -Continue taking Synthroid  25 mcg daily.  If you have any problems before your next visit feel free to message me via MyChart (minor issues or questions) or call the office, otherwise you may reach out to schedule an office visit.  Thank you! Saddie Sacks, PA-C

## 2023-10-28 NOTE — Assessment & Plan Note (Signed)
 TSH improved to 2.1. Continue Synthroid  50 mcg daily. Will cont to monitor.

## 2023-12-12 ENCOUNTER — Encounter: Payer: Self-pay | Admitting: Dermatology

## 2023-12-12 ENCOUNTER — Ambulatory Visit: Admitting: Dermatology

## 2023-12-12 VITALS — BP 108/64

## 2023-12-12 DIAGNOSIS — L814 Other melanin hyperpigmentation: Secondary | ICD-10-CM | POA: Diagnosis not present

## 2023-12-12 DIAGNOSIS — Z1283 Encounter for screening for malignant neoplasm of skin: Secondary | ICD-10-CM

## 2023-12-12 DIAGNOSIS — L821 Other seborrheic keratosis: Secondary | ICD-10-CM

## 2023-12-12 DIAGNOSIS — Z86018 Personal history of other benign neoplasm: Secondary | ICD-10-CM

## 2023-12-12 DIAGNOSIS — L918 Other hypertrophic disorders of the skin: Secondary | ICD-10-CM | POA: Diagnosis not present

## 2023-12-12 DIAGNOSIS — W908XXA Exposure to other nonionizing radiation, initial encounter: Secondary | ICD-10-CM

## 2023-12-12 DIAGNOSIS — D1801 Hemangioma of skin and subcutaneous tissue: Secondary | ICD-10-CM | POA: Diagnosis not present

## 2023-12-12 DIAGNOSIS — C44319 Basal cell carcinoma of skin of other parts of face: Secondary | ICD-10-CM | POA: Diagnosis not present

## 2023-12-12 DIAGNOSIS — D485 Neoplasm of uncertain behavior of skin: Secondary | ICD-10-CM | POA: Diagnosis not present

## 2023-12-12 DIAGNOSIS — D229 Melanocytic nevi, unspecified: Secondary | ICD-10-CM

## 2023-12-12 DIAGNOSIS — L578 Other skin changes due to chronic exposure to nonionizing radiation: Secondary | ICD-10-CM

## 2023-12-12 NOTE — Progress Notes (Signed)
 Follow-Up Visit   Subjective  Ryan [name] is a 58 y.o. male who presents for the following: Skin Cancer Screening and Full Body Skin Exam - history dysplastic nevus of left ant shoulder excised 08/07/2022.  He has skin tags around his neck that he would like to discuss removing.  The patient presents for Total-Body Skin Exam (TBSE) for skin cancer screening and mole check. The patient has spots, moles and lesions to be evaluated, some may be new or changing and the patient may have concern these could be cancer.    The following portions of the chart were reviewed this encounter and updated as appropriate: medications, allergies, medical history  Review of Systems:  No other skin or systemic complaints except as noted in HPI or Assessment and Plan.  Objective  Well appearing patient in no apparent distress; mood and affect are within normal limits.  A full examination was performed including scalp, head, eyes, ears, nose, lips, neck, chest, axillae, abdomen, back, buttocks, bilateral upper extremities, bilateral lower extremities, hands, feet, fingers, toes, fingernails, and toenails. All findings within normal limits unless otherwise noted below.   Relevant physical exam findings are noted in the Assessment and Plan.  Right Temple 5 mm pearly pink papule  Neck (8) Pedunculated flesh papules with erythema  Assessment & Plan   SKIN CANCER SCREENING PERFORMED TODAY.  ACTINIC DAMAGE - Chronic condition, secondary to cumulative UV/sun exposure - diffuse scaly erythematous macules with underlying dyspigmentation - Recommend daily broad spectrum sunscreen SPF 30+ to sun-exposed areas, reapply every 2 hours as needed.  - Staying in the shade or wearing long sleeves, sun glasses (UVA+UVB protection) and wide brim hats (4-inch brim around the entire circumference of the hat) are also recommended for sun protection.  - Call for new or changing lesions.  LENTIGINES, SEBORRHEIC  KERATOSES, HEMANGIOMAS - Benign normal skin lesions - Benign-appearing - Call for any changes  MELANOCYTIC NEVI - Tan-brown and/or pink-flesh-colored symmetric macules and papules - Benign appearing on exam today - Observation - Call clinic for new or changing moles - Recommend daily use of broad spectrum spf 30+ sunscreen to sun-exposed areas.   History of Dysplastic Nevus of left ant shoulder - No evidence of recurrence today - Recommend regular full body skin exams - Recommend daily broad spectrum sunscreen SPF 30+ to sun-exposed areas, reapply every 2 hours as needed.  - Call if any new or changing lesions are noted between office visit   NEOPLASM OF UNCERTAIN BEHAVIOR OF SKIN Right Temple Skin / nail biopsy Type of biopsy: tangential   Informed consent: discussed and consent obtained   Timeout: patient name, date of birth, surgical site, and procedure verified   Procedure prep:  Patient was prepped and draped in usual sterile fashion Prep type:  Isopropyl alcohol Anesthesia: the lesion was anesthetized in a standard fashion   Anesthetic:  1% lidocaine w/ epinephrine 1-100,000 buffered w/ 8.4% NaHCO3 Instrument used: flexible razor blade   Hemostasis achieved with: pressure, aluminum chloride and electrodesiccation   Outcome: patient tolerated procedure well   Post-procedure details: sterile dressing applied and wound care instructions given   Dressing type: bandage and petrolatum    Specimen 1 - Surgical pathology Differential Diagnosis: BCC vs other   Check Margins: No SKIN TAG (8) Neck (8) Destruction of lesion - Neck (8) Complexity: simple   Destruction method: cryotherapy   Informed consent: discussed and consent obtained   Timeout:  patient name, date of birth, surgical site, and  procedure verified Lesion destroyed using liquid nitrogen: Yes   Region frozen until ice ball extended beyond lesion: Yes   Outcome: patient tolerated procedure well with no  complications   Post-procedure details: wound care instructions given      Return in about 1 year (around 12/11/2024) for TBSE and appointment for skin tag removal if desired.  I, Roseline Hutchinson, CMA, am acting as scribe for [employer], DO .   Documentation: I have reviewed the above documentation for accuracy and completeness, and I agree with the above.  Delon Lenis, DO

## 2023-12-12 NOTE — Patient Instructions (Signed)

## 2023-12-13 LAB — SURGICAL PATHOLOGY

## 2023-12-17 ENCOUNTER — Ambulatory Visit: Payer: Self-pay | Admitting: Dermatology

## 2023-12-17 DIAGNOSIS — C4491 Basal cell carcinoma of skin, unspecified: Secondary | ICD-10-CM | POA: Insufficient documentation

## 2023-12-17 NOTE — Progress Notes (Signed)
 HI Shirron, Please call pt and notify their bx results were positive for a skin CA that will be excised by Dr. Corey via Mohs        1. Skin, right temple :  --> Mohs      BASAL CELL CARCINOMA, SUPERFICIAL AND NODULAR PATTERNS

## 2024-02-04 ENCOUNTER — Encounter: Payer: Self-pay | Admitting: Dermatology

## 2024-02-04 ENCOUNTER — Ambulatory Visit: Admitting: Dermatology

## 2024-02-04 VITALS — BP 118/85 | HR 86 | Temp 98.5°F

## 2024-02-04 DIAGNOSIS — L814 Other melanin hyperpigmentation: Secondary | ICD-10-CM

## 2024-02-04 DIAGNOSIS — L578 Other skin changes due to chronic exposure to nonionizing radiation: Secondary | ICD-10-CM

## 2024-02-04 DIAGNOSIS — C4491 Basal cell carcinoma of skin, unspecified: Secondary | ICD-10-CM

## 2024-02-04 DIAGNOSIS — C44319 Basal cell carcinoma of skin of other parts of face: Secondary | ICD-10-CM | POA: Diagnosis not present

## 2024-02-04 NOTE — Patient Instructions (Signed)

## 2024-02-04 NOTE — Progress Notes (Signed)
 Follow-Up Visit   Subjective  Ryan [name] is a 58 y.o. 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 Alm.   The following portions of the chart were reviewed this encounter and updated as appropriate: medications, allergies, medical history  Review of Systems:  No other skin or systemic complaints except as noted in HPI or Assessment and Plan.  Objective  Well appearing patient in no apparent distress; mood and affect are within normal limits.  A focused examination was performed of the following areas: Right temple Relevant physical exam findings are noted in the Assessment and Plan.   Right Temple Pearly papule   Assessment & Plan   BASAL CELL CARCINOMA (BCC), UNSPECIFIED SITE Right Temple Mohs surgery  Consent obtained: written  Anticoagulation: Is the patient taking prescription anticoagulant and/or aspirin prescribed/recommended by a physician? Yes   Was the anticoagulation regimen changed prior to Mohs? No    Anesthesia: Anesthesia method: local infiltration Local anesthetic: lidocaine 1% WITH epi  Procedure Details: Timeout: pre-procedure verification complete Procedure Prep: patient was prepped and draped in usual sterile fashion Prep type: chlorhexidine Biopsy accession number: [id number] Pre-Op diagnosis: basal cell carcinoma BCC subtype: superficial and nodular MohsAIQ Surgical site (if tumor spans multiple areas, please select predominant area): temple Surgery side: right Surgical site (from skin exam): Right Temple Pre-operative length (cm): 0.5 Pre-operative width (cm): 0.5 Indications for Mohs surgery: anatomic location where tissue conservation is critical Previously treated? No    Micrographic Surgery Details: Post-operative length (cm): 1.1 Post-operative width (cm): 1.1 Number of Mohs stages: 1 Post surgery depth of defect: subcutaneous fat  Stage 1    Tumor  features identified on Mohs section: no tumor identified  Patient tolerance of procedure: tolerated well, no immediate complications  Reconstruction: Was the defect reconstructed? Yes   Was reconstruction performed by the same Mohs surgeon? Yes   Setting of reconstruction: outpatient office When was reconstruction performed? same day Type of reconstruction: linear Linear reconstruction: complex  Opioids: Did the patient receive a prescription for opioid/narcotic related to Mohs surgery?: No    Antibiotics: Does patient meet AHA guidelines for endocarditis?: No   Does patient meet AHA guidelines for orthopedic prophylaxis?: No   Were antibiotics given on the day of surgery?: No   Did surgery breach mucosa, expose cartilage/bone, involve an area of lymphedema/inflamed/infected tissue? No    Skin repair Complexity:  Complex Final length (cm):  4.5 Informed consent: discussed and consent obtained   Timeout: patient name, date of birth, surgical site, and procedure verified   Procedure prep:  Patient was prepped and draped in usual sterile fashion Prep type:  Chlorhexidine Anesthesia: the lesion was anesthetized in a standard fashion   Anesthetic:  1% lidocaine w/ epinephrine 1-100,000 buffered w/ 8.4% NaHCO3 (4cc) Reason for type of repair: reduce the risk of dehiscence, infection, and necrosis, preserve normal anatomy and avoid adjacent structures   Undermining: area extensively undermined   Subcutaneous layers (deep stitches):  Suture size:  5-0 Suture type: Monocryl (poliglecaprone 25)   Stitches:  Buried vertical mattress Fine/surface layer approximation (top stitches):  Suture size:  6-0 Suture type: fast-absorbing plain gut   Stitches: simple running   Hemostasis achieved with: suture, pressure and electrodesiccation Outcome: patient tolerated procedure well with no complications   Post-procedure details: sterile dressing applied and wound care instructions given    Dressing type: petrolatum and pressure dressing  Return in about 1 month (around 03/05/2024) for Wound Check.  LILLETTE Rollene Gobble, RN, am acting as scribe for RUFUS CHRISTELLA HOLY, MD .   02/04/2024  HISTORY OF PRESENT ILLNESS  Ryan [name] is seen in consultation at the request of Dr. 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 They note that the area has been present for about 6 months increasing in size with time.  There is no history of previous treatment.  Reports no other new or changing lesions and has no other complaints today.  Medications and allergies: see patient chart.  Review of systems: Reviewed 8 systems and notable for the above skin cancer.  All other systems reviewed are unremarkable/negative, unless noted in the HPI. Past medical history, surgical history, family history, social history were also reviewed and are noted in the chart/questionnaire.    PHYSICAL EXAMINATION  General: Well-appearing, in no acute distress, alert and oriented x 4. Vitals reviewed in chart (if available).   Skin: Exam reveals a 0.5 x 0.5 cm erythematous papule and biopsy scar on the right temple. There are rhytids, telangiectasias, and lentigines, consistent with photodamage.  Biopsy report(s) reviewed, confirming the diagnosis.   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 Due to location, size, histology, or recurrence and the likelihood of subclinical extension as well as the need to conserve normal surrounding tissue, the patient was deemed acceptable for Mohs micrographic surgery (MMS).  The nature and purpose of the procedure, associated benefits and risks including recurrence and scarring, possible complications such as pain, infection, and bleeding, and alternative methods of treatment if appropriate were discussed with the patient during consent. The lesion location was verified by  the patient, by reviewing previous notes, pathology reports, and by photographs as well as angulation measurements if available.  Informed consent was reviewed and signed by the patient, and timeout was performed at 9:30 AM. See op note below.  2. For the photodamage and solar lentigines, sun protection discussed/information given on OTC sunscreens, and we recommend continued regular follow-up with primary dermatologist every 6 months or sooner for any growing, bleeding, or changing lesions. 3. Prognosis and future surveillance discussed. 4. Letter with treatment outcome sent to referring provider. 5. Pain acetaminophen/ibuprofen  MOHS MICROGRAPHIC SURGERY AND RECONSTRUCTION  Initial size:   0.5 x 0.5 cm Surgical defect/wound size: 1.1 x 1.1 cm Anesthesia:    0.33% lidocaine with 1:200,000 epinephrine EBL:    <5 mL Complications:  None Repair type:   Complex SQ suture:   5-0 Monocryl Cutaneous suture:  6-0 Plain gut Final size of the repair: 4.5 cm  Stages: 1  STAGE I: Anesthesia achieved with 0.5% lidocaine with 1:200,000 epinephrine. ChloraPrep applied. 1 section(s) excised using Mohs technique (this includes total peripheral and deep tissue margin excision and evaluation with frozen sections, excised and interpreted by the same physician). The tumor was first debulked and then excised with an approx. 2mm margin.  Hemostasis was achieved with electrocautery as needed.  The specimen was then oriented, subdivided/relaxed, inked, and processed using Mohs technique.    Frozen section analysis revealed a clear deep and peripheral margin.  Reconstruction  The surgical wound was then cleaned, prepped, and re-anesthetized as above. Wound edges were undermined extensively along at least one entire edge and at a distance equal to or greater than the width of the defect (see wound defect size above) in order  to achieve closure and decrease wound tension and anatomic distortion. Redundant tissue  repair including standing cone removal was performed. Hemostasis was achieved with electrocautery. Subcutaneous and epidermal tissues were approximated with the above sutures. The surgical site was then lightly scrubbed with sterile, saline-soaked gauze. Steri-strips were applied, and the area was then bandaged using Vaseline ointment, non-adherent gauze, gauze pads, and tape to provide an adequate pressure dressing. The patient tolerated the procedure well, was given detailed written and verbal wound care instructions, and was discharged in good condition.   The patient will follow-up: 4 weeks.     Documentation: I have reviewed the above documentation for accuracy and completeness, and I agree with the above.  RUFUS CHRISTELLA HOLY, MD

## 2024-03-04 ENCOUNTER — Encounter: Payer: Self-pay | Admitting: Dermatology

## 2024-03-05 ENCOUNTER — Encounter: Payer: Self-pay | Admitting: Dermatology

## 2024-03-05 ENCOUNTER — Ambulatory Visit: Admitting: Dermatology

## 2024-03-05 DIAGNOSIS — Z85828 Personal history of other malignant neoplasm of skin: Secondary | ICD-10-CM

## 2024-03-05 DIAGNOSIS — L905 Scar conditions and fibrosis of skin: Secondary | ICD-10-CM

## 2024-03-05 DIAGNOSIS — C4491 Basal cell carcinoma of skin, unspecified: Secondary | ICD-10-CM

## 2024-03-05 NOTE — Progress Notes (Signed)
° °  Follow Up Visit   Subjective  Ryan [name] is a 58 y.o. 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 The patient has been bandaging the wound as directed. The endorse the following concerns: none  The following portions of the chart were reviewed this encounter and updated as appropriate: medications, allergies, medical history  Review of Systems:  No other skin or systemic complaints except as noted in HPI or Assessment and Plan.  Objective  Well appearing patient in no apparent distress; mood and affect are within normal limits.  A focal examination was performed including face All findings within normal limits unless otherwise noted below.  Healing wound with mild erythema  Relevant physical exam findings are noted in the Assessment and Plan.     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, treated on 02/04/24, repaired with linear closure - Reassured that wound is healing well - No evidence of infection - No swelling, induration, purulence, dehiscence, or tenderness out of proportion to the clinical exam, see photo above - Discussed that scars take up to 12 months to mature from the date of surgery - Recommend SPF 30+ to scar daily to prevent purple color from UV exposure during scar maturation process - Discussed that erythema and raised appearance of scar will fade over the next 4-6 months - OK to start scar massage at 4-6 weeks post-op - Can consider silicone based products for scar healing starting at 6 weeks post-op  HISTORY OF BASAL CELL CARCINOMA OF THE SKIN - No evidence of recurrence today - Recommend regular full body skin exams - Recommend daily broad spectrum sunscreen SPF 30+ to sun-exposed areas, reapply every 2 hours as needed.  -  Call if any new or changing lesions are noted between office visits  Return if symptoms worsen or fail to improve.  I, Doyce Pan, CMA, am acting as scribe for RUFUS CHRISTELLA HOLY, MD.   Documentation: I have reviewed the above documentation for accuracy and completeness, and I agree with the above.  RUFUS CHRISTELLA HOLY, MD

## 2024-03-05 NOTE — Patient Instructions (Addendum)

## 2024-03-09 ENCOUNTER — Other Ambulatory Visit: Payer: Self-pay

## 2024-03-09 DIAGNOSIS — Z789 Other specified health status: Secondary | ICD-10-CM

## 2024-03-09 DIAGNOSIS — E782 Mixed hyperlipidemia: Secondary | ICD-10-CM

## 2024-03-24 ENCOUNTER — Other Ambulatory Visit

## 2024-03-24 ENCOUNTER — Other Ambulatory Visit: Payer: Self-pay

## 2024-03-24 DIAGNOSIS — R7989 Other specified abnormal findings of blood chemistry: Secondary | ICD-10-CM

## 2024-03-24 DIAGNOSIS — E038 Other specified hypothyroidism: Secondary | ICD-10-CM

## 2024-03-24 DIAGNOSIS — Z23 Encounter for immunization: Secondary | ICD-10-CM | POA: Diagnosis not present

## 2024-03-24 DIAGNOSIS — R7309 Other abnormal glucose: Secondary | ICD-10-CM

## 2024-03-24 DIAGNOSIS — E782 Mixed hyperlipidemia: Secondary | ICD-10-CM

## 2024-03-24 DIAGNOSIS — E559 Vitamin D deficiency, unspecified: Secondary | ICD-10-CM

## 2024-03-24 NOTE — Progress Notes (Signed)
Pt here for vaccination

## 2024-03-25 ENCOUNTER — Ambulatory Visit: Payer: Self-pay

## 2024-03-25 LAB — CBC WITH DIFFERENTIAL/PLATELET
Basophils Absolute: 0.1 x10E3/uL (ref 0.0–0.2)
Basos: 1 %
EOS (ABSOLUTE): 0.1 x10E3/uL (ref 0.0–0.4)
Eos: 2 %
Hematocrit: 44 % (ref 37.5–51.0)
Hemoglobin: 14.9 g/dL (ref 13.0–17.7)
Immature Grans (Abs): 0 x10E3/uL (ref 0.0–0.1)
Immature Granulocytes: 0 %
Lymphocytes Absolute: 2.4 x10E3/uL (ref 0.7–3.1)
Lymphs: 32 %
MCH: 30.9 pg (ref 26.6–33.0)
MCHC: 33.9 g/dL (ref 31.5–35.7)
MCV: 91 fL (ref 79–97)
Monocytes Absolute: 0.6 x10E3/uL (ref 0.1–0.9)
Monocytes: 8 %
Neutrophils Absolute: 4.3 x10E3/uL (ref 1.4–7.0)
Neutrophils: 57 %
Platelets: 264 x10E3/uL (ref 150–450)
RBC: 4.82 x10E6/uL (ref 4.14–5.80)
RDW: 12.6 % (ref 11.6–15.4)
WBC: 7.4 x10E3/uL (ref 3.4–10.8)

## 2024-03-25 LAB — COMPREHENSIVE METABOLIC PANEL WITH GFR
ALT: 30 IU/L (ref 0–44)
AST: 22 IU/L (ref 0–40)
Albumin: 4.1 g/dL (ref 3.8–4.9)
Alkaline Phosphatase: 58 IU/L (ref 47–123)
BUN/Creatinine Ratio: 16 (ref 10–24)
BUN: 20 mg/dL (ref 8–27)
Bilirubin Total: 0.5 mg/dL (ref 0.0–1.2)
CO2: 18 mmol/L — ABNORMAL LOW (ref 20–29)
Calcium: 9.1 mg/dL (ref 8.6–10.2)
Chloride: 104 mmol/L (ref 96–106)
Creatinine, Ser: 1.27 mg/dL (ref 0.76–1.27)
Globulin, Total: 2.3 g/dL (ref 1.5–4.5)
Glucose: 86 mg/dL (ref 70–99)
Potassium: 4.4 mmol/L (ref 3.5–5.2)
Sodium: 138 mmol/L (ref 134–144)
Total Protein: 6.4 g/dL (ref 6.0–8.5)
eGFR: 65 mL/min/1.73

## 2024-03-25 LAB — THYROID PANEL WITH TSH
Free Thyroxine Index: 1.8 (ref 1.2–4.9)
T3 Uptake Ratio: 27 % (ref 24–39)
T4, Total: 6.5 ug/dL (ref 4.5–12.0)
TSH: 2.93 u[IU]/mL (ref 0.450–4.500)

## 2024-03-25 LAB — LIPID PANEL
Chol/HDL Ratio: 4.3 ratio (ref 0.0–5.0)
Cholesterol, Total: 202 mg/dL — ABNORMAL HIGH (ref 100–199)
HDL: 47 mg/dL
LDL Chol Calc (NIH): 129 mg/dL — ABNORMAL HIGH (ref 0–99)
Triglycerides: 146 mg/dL (ref 0–149)
VLDL Cholesterol Cal: 26 mg/dL (ref 5–40)

## 2024-03-25 LAB — HEMOGLOBIN A1C
Est. average glucose Bld gHb Est-mCnc: 117 mg/dL
Hgb A1c MFr Bld: 5.7 % — ABNORMAL HIGH (ref 4.8–5.6)

## 2024-03-25 LAB — VITAMIN D 25 HYDROXY (VIT D DEFICIENCY, FRACTURES): Vit D, 25-Hydroxy: 50 ng/mL (ref 30.0–100.0)

## 2024-03-30 ENCOUNTER — Ambulatory Visit

## 2024-03-30 ENCOUNTER — Other Ambulatory Visit: Payer: Self-pay

## 2024-03-30 VITALS — BP 104/66 | HR 89 | Temp 97.4°F | Ht 71.0 in | Wt 243.0 lb

## 2024-03-30 DIAGNOSIS — E782 Mixed hyperlipidemia: Secondary | ICD-10-CM

## 2024-03-30 DIAGNOSIS — I82493 Acute embolism and thrombosis of other specified deep vein of lower extremity, bilateral: Secondary | ICD-10-CM

## 2024-03-30 DIAGNOSIS — Z23 Encounter for immunization: Secondary | ICD-10-CM

## 2024-03-30 DIAGNOSIS — G4730 Sleep apnea, unspecified: Secondary | ICD-10-CM

## 2024-03-30 DIAGNOSIS — E038 Other specified hypothyroidism: Secondary | ICD-10-CM

## 2024-03-30 DIAGNOSIS — Z86718 Personal history of other venous thrombosis and embolism: Secondary | ICD-10-CM | POA: Diagnosis not present

## 2024-03-30 MED ORDER — ATORVASTATIN CALCIUM 10 MG PO TABS: 10.0000 mg | ORAL_TABLET | Freq: Every day | ORAL | 1 refills | Status: AC

## 2024-03-30 MED ORDER — RIVAROXABAN 10 MG PO TABS: 10.0000 mg | ORAL_TABLET | Freq: Every day | ORAL | 2 refills | Status: AC

## 2024-03-30 NOTE — Patient Instructions (Signed)
 VISIT SUMMARY: During your visit, we discussed your sleep issues, hyperlipidemia, anticoagulation therapy, and prediabetes. We also reviewed your general health maintenance and made some adjustments to your medications.  YOUR PLAN: SLEEP DISTURBANCES: You have been experiencing difficulty staying asleep and suspect possible sleep apnea. -We have ordered a home sleep study to evaluate for sleep apnea. -If sleep apnea is confirmed, we will consider fitting you for a mouthpiece. -If the mouthpiece is not effective, we will discuss the option of using a CPAP machine.  HYPERLIPIDEMIA: Your cholesterol levels have improved slightly but remain elevated. You previously experienced body cramps with rosuvastatin . -We have started you on atorvastatin  at the lowest dose. You may halve the tablet initially. -Monitor for any side effects, especially body aches. -We will recheck your cholesterol and liver function in six weeks. -If atorvastatin  is not tolerated, we will consider Repatha.  HISTORY OF DEEP VEIN THROMBOSIS: You are continuing anticoagulation therapy with Xarelto . -Your Xarelto  prescription has been refilled with 90 tablets and refills.  PREDIABETES: Your A1c level is at 5.7, indicating prediabetes. -Continue monitoring your A1c levels. -You are encouraged to focus on weight loss and regular exercise.  GENERAL HEALTH MAINTENANCE: Your vitamin D , thyroid  function, blood counts, kidney, and liver function are normal. -Continue your current vitamin D  supplementation. -Continue your current thyroid  medication. -We have scheduled a follow-up appointment in five to six months for a routine checkup.  If you have any problems before your next visit feel free to message me via MyChart (minor issues or questions) or call the office, otherwise you may reach out to schedule an office visit.  Thank you! Saddie Sacks, PA-C

## 2024-03-31 DIAGNOSIS — G4730 Sleep apnea, unspecified: Secondary | ICD-10-CM | POA: Insufficient documentation

## 2024-03-31 NOTE — Progress Notes (Signed)
 "  Established Patient Office Visit  Subjective   Patient ID: Ryan [name], male    DOB: 04/22/1965  Age: 59 y.o. MRN: [id number]  Chief Complaint  Patient presents with   Medical Management of Chronic Issues    HPI  Discussed the use of AI scribe software for clinical note transcription with the patient, who gave verbal consent to proceed.  History of Present Illness   Ryan [name] is a 59 year old male who presents for medication management and evaluation of sleep issues.  Sleep disturbances - Wakes after five hours of sleep and has difficulty returning to sleep - Suspects possible sleep apnea due to episodes where he cannot be awakened easily, snoring, and gasping for air - History of nasal trauma, which may contribute to nocturnal breathing difficulties - No symptoms of severe shortness of breath or apnea  Musculoskeletal symptoms - Experiences body cramps, which have improved after lifestyle modifications including obtaining a new mattress and adjusting medication timing - Previously experienced body cramps with rosuvastatin  use but now that he has made some lifestyle changes, he doubts that the statin was causing the aching   Hyperlipidemia - Currently taking Zetia  - Cholesterol levels have improved slightly but remain elevated - History of rosuvastatin  use, discontinued due to body cramps but is open to doing whatever he needs to do to get the cholesterol into normal range  Metabolic findings - Recent laboratory results show stable A1c at 5.7, consistent with prediabetes - Kidney and liver function tests are normal        ROS Per HPI.    Objective:     BP 104/66   Pulse 89   Temp (!) 97.4 F (36.3 C) (Oral)   Ht 5' 11 (1.803 m)   Wt 243 lb (110.2 kg)   SpO2 95%   BMI 33.89 kg/m    Physical Exam Constitutional:      General: He is not in acute distress.    Appearance: Normal appearance.  Cardiovascular:     Rate and Rhythm: Normal rate and  regular rhythm.     Heart sounds: Normal heart sounds. No murmur heard.    No friction rub. No gallop.  Pulmonary:     Effort: Pulmonary effort is normal. No respiratory distress.     Breath sounds: Normal breath sounds.  Abdominal:     General: Bowel sounds are normal.  Musculoskeletal:        General: No swelling.     Cervical back: Neck supple.  Lymphadenopathy:     Cervical: No cervical adenopathy.  Skin:    General: Skin is warm and dry.  Neurological:     General: No focal deficit present.     Mental Status: He is alert.  Psychiatric:        Mood and Affect: Mood normal.        Behavior: Behavior normal.        Thought Content: Thought content normal.      No results found for any visits on 03/30/24.  Last CBC Lab Results  Component Value Date   WBC 7.4 03/24/2024   HGB 14.9 03/24/2024   HCT 44.0 03/24/2024   MCV 91 03/24/2024   MCH 30.9 03/24/2024   RDW 12.6 03/24/2024   PLT 264 03/24/2024   Last metabolic panel Lab Results  Component Value Date   GLUCOSE 86 03/24/2024   NA 138 03/24/2024   K 4.4 03/24/2024   CL 104 03/24/2024   CO2  18 (L) 03/24/2024   BUN 20 03/24/2024   CREATININE 1.27 03/24/2024   EGFR 65 03/24/2024   CALCIUM  9.1 03/24/2024   PROT 6.4 03/24/2024   ALBUMIN 4.1 03/24/2024   LABGLOB 2.3 03/24/2024   AGRATIO 1.7 08/29/2022   BILITOT 0.5 03/24/2024   ALKPHOS 58 03/24/2024   AST 22 03/24/2024   ALT 30 03/24/2024   ANIONGAP 7 10/03/2023   Last lipids Lab Results  Component Value Date   CHOL 202 (H) 03/24/2024   HDL 47 03/24/2024   LDLCALC 129 (H) 03/24/2024   TRIG 146 03/24/2024   CHOLHDL 4.3 03/24/2024   Last hemoglobin A1c Lab Results  Component Value Date   HGBA1C 5.7 (H) 03/24/2024   Last thyroid  functions Lab Results  Component Value Date   TSH 2.930 03/24/2024   T3TOTAL 101 05/27/2020   T4TOTAL 6.5 03/24/2024   FREET4 1.15 10/22/2023   Last vitamin D  Lab Results  Component Value Date   VD25OH 50.0  03/24/2024      The 10-year ASCVD risk score (Arnett DK, et al., 2019) is: 6.4%    Assessment & Plan:   Observed sleep apnea Assessment & Plan: Suspected due to symptoms and possible deviated septum. Prefers non-CPAP options. - Ordered home sleep study. - Consider mouthpiece fitting if confirmed. - Discuss CPAP if mouthpiece ineffective.  Orders: -     Home sleep test  History of DVT (deep vein thrombosis) Assessment & Plan: Continues on Xarelto  for anticoagulation. - Refilled Xarelto  10 mg with 90 tablets and refills.  Orders: -     Rivaroxaban ; Take 1 tablet (10 mg total) by mouth daily.  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  Dispense: 90 tablet; Refill: 2  Encounter for vaccination -     Pneumococcal conjugate vaccine 20-valent  Mixed hyperlipidemia Assessment & Plan: Last lipid panel: LDL 129, HDL 47, Trig 146. The 10-year ASCVD risk score (Arnett DK, et al., 2019) is: 6.4% Continue Zetia  10 mg daily. Cholesterol improved but elevated. Switching from rosuvastatin  to atorvastatin  due to side effects. Repatha considered if atorvastatin  not tolerated. - Started atorvastatin  at lowest dose (10 mg). Advised to break tablet in half for 7 days before increasing to full tablet.  - Monitor for side effects, especially body aches. - Recheck cholesterol and liver function in six weeks. - Consider Repatha if atorvastatin  not tolerated.  Orders: -     Comprehensive metabolic panel with GFR; Future -     Lipid panel; Future  Subclinical hypothyroidism Assessment & Plan: TSH WNL. Continue Synthroid  50 mcg daily. Will cont to monitor.    Other orders -     Atorvastatin  Calcium ; Take 1 tablet (10 mg total) by mouth daily.  Dispense: 90 tablet; Refill: 1    Return in about 6 months (around 09/27/2024) for HLD, thyroid .     Saddie JULIANNA Sacks, PA-C "

## 2024-03-31 NOTE — Assessment & Plan Note (Signed)
 Continues on Xarelto  for anticoagulation. - Refilled Xarelto  10 mg with 90 tablets and refills.

## 2024-03-31 NOTE — Assessment & Plan Note (Signed)
 Suspected due to symptoms and possible deviated septum. Prefers non-CPAP options. - Ordered home sleep study. - Consider mouthpiece fitting if confirmed. - Discuss CPAP if mouthpiece ineffective.

## 2024-03-31 NOTE — Assessment & Plan Note (Signed)
 TSH WNL. Continue Synthroid  50 mcg daily. Will cont to monitor.

## 2024-03-31 NOTE — Assessment & Plan Note (Signed)
 Last lipid panel: LDL 129, HDL 47, Trig 146. The 10-year ASCVD risk score (Arnett DK, et al., 2019) is: 6.4% Continue Zetia  10 mg daily. Cholesterol improved but elevated. Switching from rosuvastatin  to atorvastatin  due to side effects. Repatha considered if atorvastatin  not tolerated. - Started atorvastatin  at lowest dose (10 mg). Advised to break tablet in half for 7 days before increasing to full tablet.  - Monitor for side effects, especially body aches. - Recheck cholesterol and liver function in six weeks. - Consider Repatha if atorvastatin  not tolerated.

## 2024-04-03 ENCOUNTER — Telehealth: Payer: Self-pay | Admitting: Hematology and Oncology

## 2024-04-03 ENCOUNTER — Other Ambulatory Visit: Payer: Self-pay | Admitting: Hematology and Oncology

## 2024-04-03 ENCOUNTER — Inpatient Hospital Stay

## 2024-04-03 ENCOUNTER — Telehealth: Payer: Self-pay

## 2024-04-03 ENCOUNTER — Inpatient Hospital Stay (HOSPITAL_BASED_OUTPATIENT_CLINIC_OR_DEPARTMENT_OTHER): Admitting: Hematology and Oncology

## 2024-04-03 ENCOUNTER — Telehealth: Payer: Self-pay | Admitting: Physician Assistant

## 2024-04-03 DIAGNOSIS — Z86718 Personal history of other venous thrombosis and embolism: Secondary | ICD-10-CM

## 2024-04-03 NOTE — Telephone Encounter (Signed)
 spoke to pt spouse about missed appt

## 2024-04-03 NOTE — Telephone Encounter (Signed)
 Pt gave a call back and got rescheduled

## 2024-04-03 NOTE — Progress Notes (Signed)
 Rescheduled

## 2024-04-03 NOTE — Telephone Encounter (Signed)
 Called and left message for pt to return call to office as we were expecting him for labs and clinic visit today.

## 2024-04-08 ENCOUNTER — Telehealth: Payer: Self-pay | Admitting: Hematology and Oncology

## 2024-04-08 NOTE — Telephone Encounter (Signed)
 Called pt regarding rescheduling appt, Pt has been made aware of new appt date and time

## 2024-04-10 ENCOUNTER — Inpatient Hospital Stay

## 2024-04-10 ENCOUNTER — Inpatient Hospital Stay: Admitting: Physician Assistant

## 2024-04-15 ENCOUNTER — Ambulatory Visit: Admitting: Dermatology

## 2024-04-20 ENCOUNTER — Inpatient Hospital Stay: Admitting: Hematology and Oncology

## 2024-04-20 ENCOUNTER — Inpatient Hospital Stay

## 2024-05-14 ENCOUNTER — Other Ambulatory Visit

## 2024-05-28 ENCOUNTER — Inpatient Hospital Stay: Admitting: Hematology and Oncology

## 2024-05-28 ENCOUNTER — Inpatient Hospital Stay

## 2024-09-28 ENCOUNTER — Ambulatory Visit

## 2024-12-15 ENCOUNTER — Ambulatory Visit: Admitting: Dermatology
